# Patient Record
Sex: Female | Born: 1955 | Race: White | Hispanic: No | Marital: Married | Smoking: Never smoker
Health system: Southern US, Community
[De-identification: ages and names within clinical notes are randomized; demographics above are authoritative.]

## PROBLEM LIST (undated history)

## (undated) DIAGNOSIS — R05 Cough: Secondary | ICD-10-CM

## (undated) DIAGNOSIS — M1990 Unspecified osteoarthritis, unspecified site: Secondary | ICD-10-CM

## (undated) DIAGNOSIS — Z8711 Personal history of peptic ulcer disease: Secondary | ICD-10-CM

## (undated) DIAGNOSIS — K219 Gastro-esophageal reflux disease without esophagitis: Secondary | ICD-10-CM

## (undated) DIAGNOSIS — Z8719 Personal history of other diseases of the digestive system: Secondary | ICD-10-CM

## (undated) DIAGNOSIS — Z923 Personal history of irradiation: Secondary | ICD-10-CM

## (undated) DIAGNOSIS — N6489 Other specified disorders of breast: Secondary | ICD-10-CM

## (undated) DIAGNOSIS — Z8679 Personal history of other diseases of the circulatory system: Secondary | ICD-10-CM

## (undated) DIAGNOSIS — Z853 Personal history of malignant neoplasm of breast: Secondary | ICD-10-CM

## (undated) DIAGNOSIS — J019 Acute sinusitis, unspecified: Secondary | ICD-10-CM

## (undated) DIAGNOSIS — R112 Nausea with vomiting, unspecified: Secondary | ICD-10-CM

## (undated) DIAGNOSIS — C50919 Malignant neoplasm of unspecified site of unspecified female breast: Secondary | ICD-10-CM

## (undated) DIAGNOSIS — Z98811 Dental restoration status: Secondary | ICD-10-CM

## (undated) DIAGNOSIS — Z9889 Other specified postprocedural states: Secondary | ICD-10-CM

## (undated) HISTORY — DX: Gastro-esophageal reflux disease without esophagitis: K21.9

## (undated) HISTORY — PX: PLANTAR FASCIA SURGERY: SHX746

## (undated) HISTORY — PX: BREAST LUMPECTOMY: SHX2

## (undated) HISTORY — DX: Malignant neoplasm of unspecified site of unspecified female breast: C50.919

## (undated) HISTORY — PX: TONSILLECTOMY: SUR1361

## (undated) HISTORY — PX: REDUCTION MAMMAPLASTY: SUR839

## (undated) HISTORY — PX: HEMORRHOID SURGERY: SHX153

## (undated) HISTORY — DX: Unspecified osteoarthritis, unspecified site: M19.90

## (undated) HISTORY — PX: BREAST BIOPSY: SHX20

---

## 1997-05-22 ENCOUNTER — Other Ambulatory Visit: Admission: RE | Admit: 1997-05-22 | Discharge: 1997-05-22 | Payer: Self-pay | Admitting: Gynecology

## 1998-12-17 ENCOUNTER — Other Ambulatory Visit: Admission: RE | Admit: 1998-12-17 | Discharge: 1998-12-17 | Payer: Self-pay | Admitting: Internal Medicine

## 1998-12-17 ENCOUNTER — Encounter (INDEPENDENT_AMBULATORY_CARE_PROVIDER_SITE_OTHER): Payer: Self-pay

## 1999-09-12 ENCOUNTER — Encounter: Payer: Self-pay | Admitting: Gynecology

## 1999-09-12 ENCOUNTER — Encounter: Admission: RE | Admit: 1999-09-12 | Discharge: 1999-09-12 | Payer: Self-pay | Admitting: Gynecology

## 2000-11-08 ENCOUNTER — Encounter: Payer: Self-pay | Admitting: Gynecology

## 2000-11-08 ENCOUNTER — Encounter: Admission: RE | Admit: 2000-11-08 | Discharge: 2000-11-08 | Payer: Self-pay | Admitting: Gynecology

## 2000-11-16 ENCOUNTER — Other Ambulatory Visit: Admission: RE | Admit: 2000-11-16 | Discharge: 2000-11-16 | Payer: Self-pay | Admitting: Gynecology

## 2001-11-24 ENCOUNTER — Other Ambulatory Visit: Admission: RE | Admit: 2001-11-24 | Discharge: 2001-11-24 | Payer: Self-pay | Admitting: Gynecology

## 2001-12-13 ENCOUNTER — Encounter: Payer: Self-pay | Admitting: Gynecology

## 2001-12-13 ENCOUNTER — Encounter: Admission: RE | Admit: 2001-12-13 | Discharge: 2001-12-13 | Payer: Self-pay | Admitting: Gynecology

## 2003-03-01 ENCOUNTER — Encounter: Admission: RE | Admit: 2003-03-01 | Discharge: 2003-03-01 | Payer: Self-pay | Admitting: Gynecology

## 2003-03-01 ENCOUNTER — Other Ambulatory Visit: Admission: RE | Admit: 2003-03-01 | Discharge: 2003-03-01 | Payer: Self-pay | Admitting: Gynecology

## 2003-03-08 ENCOUNTER — Encounter: Admission: RE | Admit: 2003-03-08 | Discharge: 2003-03-08 | Payer: Self-pay | Admitting: Gynecology

## 2003-09-05 ENCOUNTER — Encounter (INDEPENDENT_AMBULATORY_CARE_PROVIDER_SITE_OTHER): Payer: Self-pay | Admitting: Specialist

## 2003-09-05 ENCOUNTER — Inpatient Hospital Stay (HOSPITAL_COMMUNITY): Admission: RE | Admit: 2003-09-05 | Discharge: 2003-09-07 | Payer: Self-pay | Admitting: Gynecology

## 2003-09-05 HISTORY — PX: TOTAL ABDOMINAL HYSTERECTOMY W/ BILATERAL SALPINGOOPHORECTOMY: SHX83

## 2004-04-01 ENCOUNTER — Other Ambulatory Visit: Admission: RE | Admit: 2004-04-01 | Discharge: 2004-04-01 | Payer: Self-pay | Admitting: Gynecology

## 2004-11-05 ENCOUNTER — Encounter: Admission: RE | Admit: 2004-11-05 | Discharge: 2004-11-05 | Payer: Self-pay | Admitting: Gynecology

## 2004-12-05 ENCOUNTER — Ambulatory Visit: Payer: Self-pay | Admitting: Internal Medicine

## 2004-12-18 ENCOUNTER — Ambulatory Visit: Payer: Self-pay | Admitting: Internal Medicine

## 2005-04-20 ENCOUNTER — Other Ambulatory Visit: Admission: RE | Admit: 2005-04-20 | Discharge: 2005-04-20 | Payer: Self-pay | Admitting: Gynecology

## 2005-04-23 ENCOUNTER — Observation Stay (HOSPITAL_COMMUNITY): Admission: EM | Admit: 2005-04-23 | Discharge: 2005-04-23 | Payer: Self-pay | Admitting: Emergency Medicine

## 2005-04-23 ENCOUNTER — Ambulatory Visit: Payer: Self-pay | Admitting: Cardiology

## 2005-04-24 ENCOUNTER — Ambulatory Visit: Payer: Self-pay

## 2005-04-24 ENCOUNTER — Encounter: Payer: Self-pay | Admitting: Internal Medicine

## 2005-11-10 ENCOUNTER — Encounter: Admission: RE | Admit: 2005-11-10 | Discharge: 2005-11-10 | Payer: Self-pay | Admitting: Gynecology

## 2006-04-22 ENCOUNTER — Other Ambulatory Visit: Admission: RE | Admit: 2006-04-22 | Discharge: 2006-04-22 | Payer: Self-pay | Admitting: Gynecology

## 2006-11-12 ENCOUNTER — Encounter: Admission: RE | Admit: 2006-11-12 | Discharge: 2006-11-12 | Payer: Self-pay | Admitting: Gynecology

## 2006-11-22 ENCOUNTER — Ambulatory Visit: Payer: Self-pay | Admitting: Internal Medicine

## 2006-11-30 ENCOUNTER — Ambulatory Visit: Payer: Self-pay | Admitting: Internal Medicine

## 2007-11-14 ENCOUNTER — Encounter: Admission: RE | Admit: 2007-11-14 | Discharge: 2007-11-14 | Payer: Self-pay | Admitting: Gynecology

## 2007-11-21 ENCOUNTER — Ambulatory Visit: Payer: Self-pay | Admitting: Gynecology

## 2007-11-21 ENCOUNTER — Other Ambulatory Visit: Admission: RE | Admit: 2007-11-21 | Discharge: 2007-11-21 | Payer: Self-pay | Admitting: Gynecology

## 2007-11-21 ENCOUNTER — Encounter: Payer: Self-pay | Admitting: Gynecology

## 2008-10-25 ENCOUNTER — Ambulatory Visit: Payer: Self-pay | Admitting: Internal Medicine

## 2008-11-08 ENCOUNTER — Ambulatory Visit: Payer: Self-pay | Admitting: Internal Medicine

## 2008-11-14 ENCOUNTER — Encounter: Admission: RE | Admit: 2008-11-14 | Discharge: 2008-11-14 | Payer: Self-pay | Admitting: Gynecology

## 2008-11-28 ENCOUNTER — Ambulatory Visit: Payer: Self-pay | Admitting: Internal Medicine

## 2008-11-28 DIAGNOSIS — R030 Elevated blood-pressure reading, without diagnosis of hypertension: Secondary | ICD-10-CM

## 2009-11-21 ENCOUNTER — Encounter: Admission: RE | Admit: 2009-11-21 | Discharge: 2009-11-21 | Payer: Self-pay | Admitting: Gynecology

## 2009-11-22 ENCOUNTER — Other Ambulatory Visit: Admission: RE | Admit: 2009-11-22 | Discharge: 2009-11-22 | Payer: Self-pay | Admitting: Gynecology

## 2009-11-22 ENCOUNTER — Ambulatory Visit: Payer: Self-pay | Admitting: Gynecology

## 2009-11-25 ENCOUNTER — Ambulatory Visit: Payer: Self-pay | Admitting: Gynecology

## 2010-01-26 ENCOUNTER — Encounter: Payer: Self-pay | Admitting: Gynecology

## 2010-05-16 ENCOUNTER — Telehealth: Payer: Self-pay | Admitting: Internal Medicine

## 2010-05-16 ENCOUNTER — Other Ambulatory Visit (INDEPENDENT_AMBULATORY_CARE_PROVIDER_SITE_OTHER): Payer: 59

## 2010-05-16 ENCOUNTER — Encounter: Payer: Self-pay | Admitting: Nurse Practitioner

## 2010-05-16 ENCOUNTER — Ambulatory Visit (INDEPENDENT_AMBULATORY_CARE_PROVIDER_SITE_OTHER): Payer: 59 | Admitting: Nurse Practitioner

## 2010-05-16 VITALS — BP 134/80 | HR 72 | Ht 62.0 in | Wt 168.0 lb

## 2010-05-16 DIAGNOSIS — R1013 Epigastric pain: Secondary | ICD-10-CM

## 2010-05-16 LAB — COMPREHENSIVE METABOLIC PANEL
Alkaline Phosphatase: 76 U/L (ref 39–117)
BUN: 9 mg/dL (ref 6–23)
Chloride: 103 mEq/L (ref 96–112)
GFR: 79.32 mL/min (ref >60.00)
Glucose, Bld: 86 mg/dL (ref 70–99)
Potassium: 4.3 mEq/L (ref 3.5–5.1)
Sodium: 142 mEq/L (ref 135–145)
Total Bilirubin: 0.8 mg/dL (ref 0.3–1.2)

## 2010-05-16 LAB — CBC WITH DIFFERENTIAL/PLATELET
Basophils Relative: 0.3 % (ref 0.0–3.0)
Eosinophils Relative: 0.7 % (ref 0.0–5.0)
HCT: 44.7 % (ref 36.0–46.0)
Lymphs Abs: 1.5 10*3/uL (ref 0.7–4.0)
MCV: 96.9 fl (ref 78.0–100.0)
Monocytes Absolute: 0.4 10*3/uL (ref 0.1–1.0)
Monocytes Relative: 6.5 % (ref 3.0–12.0)
Neutrophils Relative %: 69.9 % (ref 43.0–77.0)
RBC: 4.61 Mil/uL (ref 3.87–5.11)
WBC: 6.6 10*3/uL (ref 4.5–10.5)

## 2010-05-16 MED ORDER — ESOMEPRAZOLE MAGNESIUM 40 MG PO CPDR: 40.0000 mg | DELAYED_RELEASE_CAPSULE | Freq: Every day | ORAL | Status: DC

## 2010-05-16 MED ORDER — TRAMADOL HCL 50 MG PO TABS: 50.0000 mg | ORAL_TABLET | Freq: Four times a day (QID) | ORAL | Status: DC | PRN

## 2010-05-16 NOTE — Patient Instructions (Addendum)
We have scheduled the Endoscopy for 05-22-2010 Directions and brochure provided. We scheduled the  Ultrasound on 5-21. Hold the aspirin and advil until after the procedure. We have given you Nexium samples. Take 1 30 min before breakfast  Ulcer Disease (Peptic Ulcer, Gastric Ulcer, Duodenal Ulcer) You have an ulcer. This may be in your stomach (gastric ulcer) or in the first part of your small bowel, the duodenum (duodenal ulcer). An ulcer is a break in the lining of the stomach or duodenum. The ulcer causes erosion into the deeper tissue. CAUSES The stomach has a lining to protect itself from the acid that digests food. The lining can be damaged in two main ways:  The Helico Pylori bacteria (H. Pyolori) can infect the lining of the stomach and cause ulcers.   Nonsteroidal, anti-inflammatory medications (NSAIDS) can cause gastric ulcerations.   Smoking tobacco can increase the acid in the stomach. This can lead to ulcers, and will impair healing of ulcers.  Other factors, such as alcohol use and stress may contribute to ulcer formation. Rarely, a tumor or cancer can cause an ulcer.  SYMPTOMS The problems (symptoms) of ulcer disease are usually a burning or gnawing of the mid-upper belly (abdomen). This is often worse on an empty stomach and may get better with food. This may be associated with feeling sick to your stomach (nausea), bloating, and vomiting. If the ulcer results in bleeding, it can cause:  Black, tarry stools.   Vomiting of bright red blood.   Vomiting coffee-ground-looking materials.  With severe bleeding, there may be loss of consciousness and shock.  DIAGNOSIS Learning what is wrong (diagnosis) is usually made based upon your history and an exam. Medications are so effective that further tests may not be necessary. If needed, other tests may include:  Blood tests, x-rays, or heart tests. These are done to be sure that no other conditions are causing your symptoms.    X-rays (Barium studies) such as an upper GI series.   Most commonly, an upper GI endoscopy can confirm your diagnosis. This is when a flexible tube is passed through the mouth (using sedative medication). The tube is used to look at the inside of esophagus, stomach, and small bowel. Abnormal pieces of tissue may be removed to examine under the microscope (biopsy).  TREATMENT Bleeding from ulcers can usually be treated via endoscopy. Rarely, surgery is needed for ulcers when bleeding cannot be stopped. Surgery is needed if the ulcer goes through the wall of the stomach or duodenum.  After any bleeding is stopped, medications are the main treatment:  If your ulcer was caused by the bacteria H. Pylori, then you will need antibiotics to kill the infection. Usually, a program involving more than one antibiotic, and a medicine for stomach acid, is prescribed.   Medicine to decrease acid production is used for almost all ulcers. Your caregiver will make a recommendation. They will also tell you how long you must use the medication.   Stop using any medications or substances that may have contributed to your ulcer (alcohol, tobacco, caffeine, for example).   Medications are available that protect the lining of the bowel.  HOME CARE INSTRUCTIONS Continue regular work and usual activities unless advised otherwise by your caregiver.  Avoid tobacco, alcohol, and caffeine. Tobacco use will decrease and slow the rates of healing.   Avoid foods that seem to aggravate or cause discomfort.   There are many over-the-counter products available to control stomach acid and other symptoms.  Discuss these with your caregiver before using them. DO NOT substitute over-the-counter medications for prescription medications without discussing with your caregiver.   Special diets are not usually needed.   Keep any follow-up appointments and blood tests, as directed.  SEEK MEDICAL CARE IF:  Your pain or other ulcer  symptoms do not improve within a few days of starting treatment.   You develop diarrhea. This can be a complication of certain treatments.   You have ongoing indigestion or heartburn, even if your main ulcer symptoms are improved.  SEEK IMMEDIATE MEDICAL CARE IF:  You develop bright red, rectal bleeding; dark black, tarry stools; or vomit blood.   You become light-headed, weak, have fainting episodes, or become sweaty, cold and clammy.   You experience severe abdominal pain not controlled by medications. DO NOT take pain medications unless ordered by your caregiver.  Document Released: 10/01/2004 Document Re-Released: 03/20/2008 [facility] Patient Information 2011 [place], [place].

## 2010-05-16 NOTE — Telephone Encounter (Signed)
Pt complains of epigastric pain and burning. States it woke her up at 1am and she also had pain between her shoulder blades and some nausea. Pt never threw up but was nauseated. Requests to be seen today. Appt made for pt to see Willette Cluster NP.

## 2010-05-16 NOTE — Progress Notes (Signed)
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 Patient was last seen at time of her colonoscopy in November 2010.  Patient presents for evaluation of epigastric pain. At 1:00am this morning patient woke up with epigastric pain radiating through to the middle of her shoulder blades. She had associated nausea but no vomiting. After walking around and belching pain was relieved but recurred within a couple of hours. She still having waves of pain at present. Patient isn't quite sure she's had this pain before, certain not this intense anyway. No fever or chills. No black stools. Patient has been taking Advil twice daily for plantar fasciitis. She is unable to stop the NSAID because of recurrent pain in her feet. She also takes a daily baby aspirin. No heartburn , no change in bowels.  Past Medical History  Diagnosis Date  . Elevated blood pressure reading without diagnosis of hypertension    Past Surgical History  Procedure Date  . Abdominal hysterectomy   . Cesarean section   . Hemorrhoid surgery   . Foot surgery     Left  . Tonsillectomy     reports that she has never smoked. She has never used smokeless tobacco. She reports that she drinks alcohol. She reports that she does not use illicit drugs. family history includes Colon cancer in her father. Allergies    Allergen Reactions  . Penicillins   . Sulfonamide Derivatives     REACTION: hives    Outpatient Encounter Prescriptions as of 05/16/2010  Medication Sig Dispense Refill  . aspirin 81 MG tablet Take 81 mg by mouth daily.        [name] ibuprofen (ADVIL) 200 MG tablet Take 200 mg by mouth. Two twice daily          ROS: All other systems reviewed and negative except where noted in the History of Present Illness.   Physical Exam: General: Well developed white female in no acute distress Head: Normocephalic and atraumatic Eyes:  sclerae anicteric,conjunctive pink. Ears: Normal auditory acuity Mouth: No deformity or lesions Neck: Supple, no masses.  Lungs: Clear throughout to auscultation Heart: Regular rate and rhythm; no murmurs heard Abdomen: Soft, non distended. Mild left upper quadrant tenderness .No masses or hepatomegaly noted. Normal Bowel sounds Rectal: Scant amount of light brown, heme-negative stool in the vault Musculoskeletal: Symmetrical with no gross deformities  Skin: No lesions on visible extremities Extremities: No edema or deformities noted Neurological: Alert oriented x 4, grossly nonfocal Cervical Nodes:  No significant cervical adenopathy Psychological:  Alert and cooperative. Normal mood and affect  Assessment and Plan:

## 2010-05-16 NOTE — Telephone Encounter (Signed)
Left message for pt to call back  °

## 2010-05-18 DIAGNOSIS — R1013 Epigastric pain: Secondary | ICD-10-CM | POA: Insufficient documentation

## 2010-05-18 NOTE — Assessment & Plan Note (Signed)
Acute epigastric pain radiating through to back with associated nausea. Rule out gallbladder disease. Rule out PUD in setting of daily Advil and daily baby ASA. Will obtain some basic labs and patient will be scheduled for EGD. The benefits, risks, and potential complications of EGD with possible biopsies were discussed with the patient and she agrees to proceed. If EGD is unrevealing, will obtain an ultrasound of the abdomen. Hold NSAIDs until after EGD. Will try her on Ultram for foot pain. Begin daily PPI.

## 2010-05-19 ENCOUNTER — Telehealth: Payer: Self-pay | Admitting: Internal Medicine

## 2010-05-19 ENCOUNTER — Encounter: Payer: Self-pay | Admitting: Internal Medicine

## 2010-05-19 NOTE — Telephone Encounter (Signed)
Pt states she saw Willette Cluster NP on Friday. She was having epigastric pain and right below her sternum was tender to touch. Pt is scheduled for an egd for Thursday. She states that over the weekend the pain moved down through her stomach and she had diarrhea for a day. She reports the epigastric pain is gone and she wonders if she needs to keep the appt for the EGD. Dr. Marina Goodell please advise.

## 2010-05-19 NOTE — Progress Notes (Signed)
i agree with the plan outlined in this note 

## 2010-05-19 NOTE — Telephone Encounter (Signed)
Probably best that she keeps the appointment for EGD given the recent problems with pain. Also, she has been on NSAIDs for plantar fasciitis. It would be helpful to rule out ulcer, or have a baseline exam in case the pain were to return. Depending upon the EGD findings, that would determine the need for further evaluation, such as gallbladder. I will discuss this with her when I see her at the time of her EGD. Thanks

## 2010-05-19 NOTE — Telephone Encounter (Signed)
Marie Pierce, see my endoscopy report after her exam is complete. you may need to reschedule the ultrasound for her

## 2010-05-19 NOTE — Telephone Encounter (Signed)
Error

## 2010-05-19 NOTE — Telephone Encounter (Signed)
Pt aware of Dr. Lamar Sprinkles recommendations and will keep the EGD appt. She also wanted to let Dr. Marina Goodell know that she is scheduled for an abdominal ultrasound and it is scheduled at [facility] for the 21st. If she needs to have this done after the EGD is done she will need for [place] to move it to [facility] for insurance coverage purposes. [facility] is out of network for her insurance. She just wanted to make sure we were aware.

## 2010-05-22 ENCOUNTER — Telehealth: Payer: Self-pay | Admitting: *Deleted

## 2010-05-22 ENCOUNTER — Ambulatory Visit (AMBULATORY_SURGERY_CENTER): Payer: 59 | Admitting: Internal Medicine

## 2010-05-22 ENCOUNTER — Encounter: Payer: Self-pay | Admitting: Internal Medicine

## 2010-05-22 VITALS — HR 85 | Temp 98.9°F | Ht 62.0 in | Wt 167.0 lb

## 2010-05-22 DIAGNOSIS — R1013 Epigastric pain: Secondary | ICD-10-CM

## 2010-05-22 DIAGNOSIS — K259 Gastric ulcer, unspecified as acute or chronic, without hemorrhage or perforation: Secondary | ICD-10-CM

## 2010-05-22 DIAGNOSIS — K2970 Gastritis, unspecified, without bleeding: Secondary | ICD-10-CM

## 2010-05-22 DIAGNOSIS — K253 Acute gastric ulcer without hemorrhage or perforation: Secondary | ICD-10-CM

## 2010-05-22 MED ORDER — SODIUM CHLORIDE 0.9 % IV SOLN: 500.0000 mL | INTRAVENOUS | Status: DC

## 2010-05-22 NOTE — Patient Instructions (Signed)
Dr. Marina Goodell saw esophagitis in the distal esophagus.  Multiple gastric ulcers. Clo test biopsy was taken.  We will advise you of the biopsy results.  Take Nexium 40 mh daily until next endoscopy.  Take Nexium in am 20 - 30 minutes before breakfast.  Avoid antiinflammotory medication (NSAIDS).  Resume all other meds other than NSAIDS today.  Previsit scheduled for Friday July 12, 2010 at 4:00 pm arrive by 3:45pm.  Bring medication list and insurance card.  Endoscopy scheduled for thur. July 17, 2010 9;00.  Please arrive at 8:00 am.  Call if any questions or concerns.

## 2010-05-22 NOTE — Telephone Encounter (Signed)
Called [facility] Attn:  Lynnea Ferrier and cxld Abdominal Ultrasound per Dr. Marina Goodell

## 2010-05-22 NOTE — Progress Notes (Signed)
Pt to the rest room before d/c.  Her daughter was at her side. MAW  No complaints on d/c.  Pt also asked if she was supposed to still have the scheduled ultra sound that [place] scheduled already.  Elizbeth Squires, RN will ask Dr. Marina Goodell.  Per Dr. Marina Goodell cancel the u/s.  Pt notified and Doristine Johns was called to cancel the u/s.maw

## 2010-05-23 ENCOUNTER — Telehealth: Payer: Self-pay

## 2010-05-23 NOTE — Consult Note (Signed)
NAMEAMILYAH, NACK               ACCOUNT NO.:  [id number]   MEDICAL RECORD NO.:  [id number]          PATIENT TYPE:  INP   LOCATION:  4705                         FACILITY:  [facility]   PHYSICIAN:  Duke Salvia, M.D.  DATE OF BIRTH:  06-16-1955   DATE OF CONSULTATION:  04/23/2005  DATE OF DISCHARGE:  04/23/2005                                   CONSULTATION   The patient was seen at the request of Dr. [place] Bing because of  palpitations.   Mrs. Talamante is a 55 year old woman with a past history of tachy  palpitations for the last 15 years.  They occurred the first time when she  was pregnant with her first child.  It lasted a couple of hours at that  time.  She subsequently has had these a couple of times a year and they  abort on their own after two or three minutes.  They are associated with  some degree of shortness of breath if they last long, some sense of  uneasiness.  They are _________ positive and diuretic positive.  Yesterday  she developed a spell while working at [employer].  It was typical of her other  spells.  Because it persisted, she went to Urgent Care where Valsalva was  associated with termination of the tachycardia.   She has no history of syncope or presyncope, no orthopnea, nocturnal dyspnea  or peripheral edema.   She does drink some caffeine.   According to her husband, she snores.  She also complains of fatigue.  She  attributes the latter to sleeping poorly because of her hot flashes.   After admission to the hospital, her troponins have bumped a little bit to  0.18.  Her cardiac risk factors are negative for hypertension, cigarettes,  diabetes, but she does have a positive family history.   MEDICATIONS:  Aspirin 81 and ibuprofen.   ALLERGIES:  SHE IS ALLERGIC TO ACETAMINOPHEN AND SULFA.   PAST SURGICAL HISTORY:  Notable for:  1.  TAH/BSO.  2.  C-section times two.   SOCIAL HISTORY:  She is married.  She is an Scientist, forensic.  She also works at her  church.  She has two children.  She does not use cigarettes, alcohol or  recreational drugs.   PHYSICAL EXAMINATION:  On examination, she is a middle-aged Caucasian female  appearing her stated age of 55.  Her blood pressure is 117/86.  Her pulse  was 86.  Her HEENT exam demonstrated no __________ xanthomata.  Neck veins  were flat.  Carotids were brisk and full bilaterally without bruits.  Back  was without kyphosis or scoliosis.  Lungs were clear.  Heart sounds were  regular without murmurs or gallops.  The abdomen was soft with active bowel  sounds, without midline pulsation or hepatomegaly.  Femoral pulses were 2+,  distal pulses were intact, and there is no clubbing, cyanosis or edema.  The  neurological exam was grossly normal.   Electrocardiogram of her tachycardia demonstrated a narrow QRS tachycardia  at a cycle length of 320 milliseconds.  There is clearly an inscribed  P wave  in the proximal portion of the ST segment in lead V1 consistent with an R  prime and a pseudo-Q wave notable in lead III.   Review of tracing labeled April 22, 2005 at 21:03:09 which had raised the  possibility of atrial flutter as well as the tracing from April 22, 2005 at  21:03:35 are in my opinion artifactual.  There is evidence from the post-  Valsalva strip of clear sinus rhythm.  The things that are labeled as #2  follow that and I suspect that these are artifactual and in fact represent  sinus rhythm.   IMPRESSION:  1.  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  2.  Positive troponin.  3.  Fatigue with symptoms consistent with obstructive sleep apnea.   I have suggested that she be considered for a sleep study.  We will plan to  undertake a stress Myoview in the office in the morning given her abnormal  troponins.  While she does not need an echo yet, if she were to have an  ablation we would want to get an echo, and so she would like to go ahead and  get that done at  the same time.   She will follow up with her primary physician regarding the possibility of a  sleep study.           ______________________________  Duke Salvia, M.D.     SCK/MEDQ  D:  04/23/2005  T:  04/24/2005  Job:  [id number]   cc:   Urgent Care

## 2010-05-23 NOTE — Discharge Summary (Signed)
Marie Pierce, Marie Pierce               ACCOUNT NO.:  [id number]   MEDICAL RECORD NO.:  [id number]          PATIENT TYPE:  INP   LOCATION:  4705                         FACILITY:  [facility]   PHYSICIAN:  Doylene Canning. Ladona Ridgel, MD    DATE OF BIRTH:  16-Oct-1955   DATE OF ADMISSION:  04/22/2005  DATE OF DISCHARGE:  04/23/2005                                 DISCHARGE SUMMARY   He had   ALLERGIES:  1. ACETAMINOPHEN.  2. SULFA.   PRIMARY DIAGNOSES:  1. Admitted with tachy-palpitations probably and arteriovenous nodal      reentry tachycardia (spontaneous conversion in the emergency room).  2. The patient choosing beta blocker therapy over ablation for      suppression:  Inderal 10 mg as needed.   SECONDARY DIAGNOSES:  1. Fatigue with symptoms consistent with obstructive sleep apnea.  2. Status post total abdominal hysterectomy/bilateral salpingo-      oophorectomy menorrhagia/  3. Cesarean section x2.   PROCEDURES:  None this admission.   PLAN:  1. Outpatient Myoview, April 24, 2005 at 9:45.  2. Echocardiogram, April 24, 2005 at 1 o'clock in the afternoon.  3. Recommend sleep study with primary care physician to set this up.   BRIEF HISTORY:  Marie Pierce is a 55 year old female.  She has a history of  palpitation which dates back to 15 years ago.  These were first noted after  the patient gave birth to her first child.  Episodes prior to this admission  were infrequent about two to three a year, lasting only two to three minutes  and they all resolved spontaneously.   Yesterday, she noted onset of tachy arrhythmias.  She felt short of breath.  She was slightly anxious.  There was no chest pain, no dizziness, no  syncope.  The patient has one to two cups of coffee in the morning and takes  antihistamines at night.  She presented to Urgent Care because of the  symptoms and heart racing did not subside over a two to three-hour period.  Urgent Care interventions reduced the rate through  Valsalva maneuver and  then they sent the patient to the emergency room at [facility].  While in the emergency room, she had spontaneous conversion to sinus rhythm.   HOSPITAL COURSE:  The patient presented by way of Urgent Care to [facility]  Emergency Room with tachypalpitations.  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  Her  troponin I studies were measured. They were less than 0.05, less than 0.05  and 0.18.  D-dimer was 0.45.  The patient was not complaining of chest pain,  only dyspnea and palpitations.  She was seen in consultation by Dr. Graciela Husbands.  The following options were discussed with the patient:  1)  The patient  could do nothing, but use maneuvers when having tachypalpitations; 2)  Drug  therapy as needed for example Inderal 10 mg daily; 3)  Electrophysiology  study with radiofrequency catheter ablation.  The patient would like to  pursue the beta blocker therapy and so she was discharged  the same day April 23, 2005 with Inderal 10 mg as needed.   She was set up for a stress Myoview study April 24, 2005 and an  echocardiogram April 24, 2005.  She has follow-up with Dr. Graciela Husbands after these  procedures are finished.  She is asked not to take her beta blocker until  after her stress study.  The patient was given a prescription for Inderal 10  mg tablets.  Other pertinent labs this admission:  Complete blood count  white cells 6.6, hemoglobin 14.8, hematocrit 42.8, platelets 199.  Serum  electrolytes sodium 143, potassium 4.2, chloride one 111, carbonate 27,  glucose is 122, BUN is 14, creatinine 0.8, SGOT 26, SGPT 26, alkaline  phosphatase 103.  Lipid profile:  Total cholesterol 202, triglycerides 164,  HDL cholesterol was 41.  The LDL cholesterol was high at 128.  TSH this  admission 1.576.     ______________________________  Maple Mirza, PA    ______________________________  Doylene Canning. Ladona Ridgel, MD     GM/MEDQ  D:  08/31/2005  T:  09/01/2005  Job:  [id number]   cc:   Duke Salvia, MD,FACC

## 2010-05-23 NOTE — Telephone Encounter (Signed)
No answer

## 2010-05-23 NOTE — Discharge Summary (Signed)
NAME:  Marie Pierce, Marie Pierce                         ACCOUNT NO.:  [id number]   MEDICAL RECORD NO.:  [id number]                   PATIENT TYPE:  INP   LOCATION:  9320                                 FACILITY:  [facility]   PHYSICIAN:  Timothy P. Fontaine, M.D.           DATE OF BIRTH:  1955/08/22   DATE OF ADMISSION:  09/05/2003  DATE OF DISCHARGE:  09/07/2003                                 DISCHARGE SUMMARY   DISCHARGE DIAGNOSES:  1.  Leiomyomata.  2.  Menorrhagia.  3.  Pathology pending.   PROCEDURE:  Total abdominal hysterectomy and bilateral salpingo-  oophorectomy, September 05, 2003.   HOSPITAL COURSE:  A 55 year old female, underwent uncomplicated TAH/BSO,  September 05, 2003, with findings of multiple leiomyomata.  The patient's  postoperative course was uncomplicated, and she was discharged on  postoperative day #2, ambulating well, tolerating a regular diet, with a  postoperative hemoglobin of 10.3.  The patient received precautions,  instructions, and follow up.  Will be seen in the office in 2 weeks and  received a prescription for oral Demerol 50 mg 1 p.o. q.4-6h. p.r.n. pain.  The patient is allergic to ACETAMINOPHEN.                                               Timothy P. Audie Box, M.D.    TPF/MEDQ  D:  09/07/2003  T:  09/10/2003  Job:  [id number]

## 2010-05-23 NOTE — Op Note (Signed)
NAME:  Marie Pierce, Marie Pierce                         ACCOUNT NO.:  [id number]   MEDICAL RECORD NO.:  [id number]                   PATIENT TYPE:  INP   LOCATION:  9399                                 FACILITY:  [facility]   PHYSICIAN:  Timothy P. Fontaine, M.D.           DATE OF BIRTH:  1955/07/07   DATE OF PROCEDURE:  09/05/2003  DATE OF DISCHARGE:                                 OPERATIVE REPORT   POSTOPERATIVE DIAGNOSIS:  Leiomyomata.   POSTOPERATIVE DIAGNOSIS:  Leiomyomata.   PROCEDURE:  Total abdominal hysterectomy, bilateral salpingo-oophorectomy.   SURGEON:  Timothy P. Fontaine, M.D.   ASSISTANTGaetano Hawthorne. Lily Peer, M.D.   ANESTHESIA:  General endotracheal.   COMPLICATIONS:  None.   ESTIMATED BLOOD LOSS:  300 mL.   SPECIMENS:  Uterus, fresh weight 350 g; bilateral tubes; bilateral ovaries.   FINDINGS:  Multiple myomas.  Ovaries and fallopian tube segments grossly  normal bilaterally.   PROCEDURE:  The patient was taken to the operating room, underwent general  endotracheal anesthesia, received an abdominal, perineal, vaginal  preparation with Betadine solution.  A Foley catheter was placed in sterile  technique by the nursing personnel.  The patient draped in the usual  fashion.  The abdomen was sharply entered through a repeat Pfannenstiel  incision, achieving adequate hemostasis at all levels.  The Balfour  retractor was placed within the incision and the intestines were packed from  the operative field.  There were significant vesicouterine fold adhesions,  which initially had to be dissected free, and subsequently the bladder blade  was placed.  The uterus was elevated from the pelvis, the left found  ligament identified, transected with electrocautery, the infundibulopelvic  ligament and vessels identified, doubly clamped, cut, and doubly ligated  using 0 Vicryl suture.  A similar procedure was carried out on the other  side.  The vesicouterine plane was then sharply  developed, the right uterine  vessels skeletonized, clamped, cut, and doubly ligated using 0 Vicryl  suture.  A similar procedure was carried out on the other side.  The uterus  was then initially freed from its attachments through clamping, cutting, and  ligating of the parametrial tissues, at which point, due to the bulk of the  uterus, a supracervical hysterectomy was performed to allow better  visualization of the posterior cul-de-sac.  There was sigmoid and adhesions  noted to the posterior cul-de-sac, and these were sharply lysed and freed to  allow removal of the cervix.  Subsequently the cervical stump was elevated  and progressively freed from its attachments through clamping, cutting, and  ligating of the paracervical and cardinal ligaments using 0 Vicryl suture.  Subsequently the vagina was sharply entered.  The cervix was excised, right  and left vaginal angle sutures were placed using 0 Vicryl suture and tagged  for future reference.  The vaginal cuff was run using 0 Vicryl suture in a  running interlocking stitch and subsequently  was reapproximated anterior to  posterior using 0 Vicryl stitch in a simple stitch.  The pelvis was  copiously irrigated, adequate hemostasis visualized, the bowel packing  removed, the Balfour retractor and bladder blade removed, and the anterior  fascia was reapproximated using 0 Vicryl suture in a running stitch starting  at the angle and meeting in the middle.  The subcutaneous tissues were  irrigated, hemostasis was achieved with electrocautery.  The skin was  reapproximated using 2-0 Vicryl in a running subcuticular stitch.  Steri-  Strips and Benzoin applied, sterile dressing applied.  The patient awakened  without difficulty and taken to the recovery room in good condition, having  tolerated the procedure well.                                               Timothy P. Audie Box, M.D.    TPF/MEDQ  D:  09/05/2003  T:  09/05/2003  Job:   [id number]

## 2010-05-23 NOTE — H&P (Signed)
NAME:  Marie Pierce, DEMONTE                         ACCOUNT NO.:  [id number]   MEDICAL RECORD NO.:  [id number]                   PATIENT TYPE:  INP   LOCATION:  NA                                   [facility]   PHYSICIAN:  Timothy P. Fontaine, M.D.           DATE OF BIRTH:  [birth year]   DATE OF ADMISSION:  09/05/2003  DATE OF DISCHARGE:                                HISTORY & PHYSICAL   CHIEF COMPLAINT:  Increasing menorrhagia, dysmenorrhea.   HISTORY OF PRESENT ILLNESS:  [age]-year-old G2, P2 female, status post  tubal sterilization, increasing menorrhagia/dysmenorrhea,  outpatient  evaluation reveals enlarged uterus with multiple myomas, for total abdominal  hysterectomy, bilateral salpingo-oophorectomy.  The risks, benefits,  indications and alternatives of the procedure were reviewed with her and she  wants to proceed with surgery.   PAST MEDICAL HISTORY:  Past medical history uncomplicated.   PAST SURGICAL HISTORY:  Past surgical history includes:  1. Cesarean section x2 with tubal sterilization.  2. Breast biopsy x2.  3. D&C.  4. Hysteroscopy.  5. Laparoscopy.   ALLERGIES:  ACETAMINOPHEN and SULFA.   REVIEW OF SYSTEMS:  Review of systems noncontributory.   SOCIAL HISTORY:  Social history noncontributory.   FAMILY HISTORY:  Family history noncontributory.   ADMISSION PHYSICAL EXAM:  HEENT:  Normal.  LUNGS:  Lungs clear.  CARDIAC:  Regular rate without murmurs, rubs, or gallops.  ABDOMEN:  Abdominal exam is benign.  PELVIC:  External, BUS, vagina normal.  Cervix normal.  Uterus bulky, mildly  enlarged, irregular, consistent with leiomyomata.  Adnexa without masses or  tenderness.   ASSESSMENT:  [age]-year-old gravida 2, para 2 female, status post  sterilization, increasing menorrhagia/dysmenorrhea/multiple myomas, for  total abdominal hysterectomy/bilateral salpingo-oophorectomy.  The risks,  benefits, indications and alternatives for the procedure were  reviewed with  the patient as well as the expected intraoperative/postoperative course.  Ovarian conservation issue was discussed with the patient.  The options of  keeping both ovaries versus removing them were reviewed, the risks of  keeping her ovaries to include ovarian problems such as pain or reoperation  for cysts, as well as ovarian cancer were discussed.  The benefits of  continued hormone production were reviewed and the risks of removal leading  to hypoestrogenism and symptoms requiring estrogen replacement therapy were  all discussed with her.  The risks of estrogen replacement therapy were  discussed to include the risks of stroke, heart attack, deep venous  thrombosis as well as breast cancer issues all reviewed with her and she  wants both ovaries removed.  Absolute irreversible sterility associated with  hysterectomy was also discussed with her.  Sexuality following hysterectomy  was discussed and the potential for orgasmic dysfunction as well as  persistent dyspareunia were discussed, understood and accepted.  The acute  surgical risks were reviewed with her to include infection, both internal  with abscess formation requiring opening and draining, reoperation of  abscesses, incisional complications such as incisional infections, seromas  or hematomas requiring opening and draining of incisions, closure by  secondary intention, as well as generalized infections requiring prolonged  antibiotics were all discussed, understood and accepted.  The risk of  hemorrhage necessitating transfusion and the risks of transfusion were  reviewed to include transfusion reaction, hepatitis, human immunodeficiency  virus, mad cow disease and other unknown entities.  The risks of inadvertant  injury to internal organs including bowel, bladder, ureters, vessels and  nerves necessitating major exploratory or reparative surgeries and future  reparative surgeries including ostomy formation were  all discussed,  understood and accepted.  The patient's questions were answered to her  satisfaction and she is ready to proceed with surgery.                                               Timothy P. Audie Box, M.D.    TPF/MEDQ  D:  09/03/2003  T:  09/03/2003  Job:  [id number]

## 2010-05-23 NOTE — H&P (Signed)
Marie Pierce, Marie Pierce               ACCOUNT NO.:  [id number]   MEDICAL RECORD NO.:  [id number]          PATIENT TYPE:  INP   LOCATION:  1831                         FACILITY:  [facility]   PHYSICIAN:  Greggory Stallion L. Pernell Dupre, M.D.  DATE OF BIRTH:  03/17/1955   DATE OF ADMISSION:  04/22/2005  DATE OF DISCHARGE:                                HISTORY & PHYSICAL   PRIMARY CARE PHYSICIAN:  Her gynecologist.   CHIEF COMPLAINT:  Palpitations, shortness of breath and chest pressure.   HISTORY OF PRESENT ILLNESS:  This is a 55 year old Caucasian female with no  significant past medical history who presents with palpitation, shortness of  breath, chest heaviness.  The patient states the symptoms have occurred over  the past 15 years.  They began when she had a baby with associated  palpitations attributed to caffeine intake.  Since that time, she gets  palpitations approximately two to three times per year and never diagnosed  with atrial fibrillation or flutter.  Today, at church, while icing a cake,  palpitations started, lasted approximately 45 minutes.  [facility] and started developing some heaviness of her chest.  EKG done found to  have atrial flutter 2:1.  They referred her to [facility].  No  syncope, presyncope, no orthopnea, PND, or lower extremity edema.  The  patient states that she drinks may be one to two cups of coffee in the a.m.  She takes an antihistamine every night.  She also drinks green tea today.  The patient spontaneously converted back to normal sinus rhythm in the  emergency room.   ALLERGIES:  ACETAMINOPHEN and SULFA.   MEDICATIONS:  1.  Aspirin 81 mg daily.  2.  Ibuprofen.  3.  No recent sinus medicines.  4.  She takes an antihistamine every night.   PAST MEDICAL HISTORY:  1.  Total abdominal hysterectomy.  2.  Bilateral salpingo-oophorectomy.  3.  Early myomata and menorrhagia.  4.  C-sections x2.   SOCIAL HISTORY:  Lives in [place] with  her husband.  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  Has two children.  No tobacco.  Occasional  alcohol.  No drugs.  She is working on her shots and diet.  Herbal use,  green tea today.   FAMILY HISTORY:  Mother is alive at 6, has a history of diabetes and  cardiomyopathy.  Father is alive at 25, has a history of MI and  hypertension.  Siblings-two brothers who are healthy.  Two children who are  healthy.   REVIEW OF SYSTEMS:  Positive for chest pain, shortness of breath,  palpitations.  All other systems are negative.   PHYSICAL EXAMINATION:  VITAL SIGNS:  Afebrile 98.1, pulse 102, respiratory  rate 16, blood pressure 140/83, saturating 98% on room air.  GENERAL:  Alert and oriented x3 in no apparent distress.  HEENT:  No increase JVP.  No thyromegaly and no carotid bruits.  CARDIOVASCULAR:  Normal S1 and S2.  No murmurs, gallops, or rubs.  LUNGS:  Clear to auscultation bilaterally.  ABDOMEN:  Soft, nontender, and nondistended.  Overweight.  Positive bowel  sounds.  EXTREMITIES:  No clubbing, cyanosis, or edema.  Normal radial, femoral and  pedal pulses.  NEUROLOGICAL:  Alert and oriented x3.  Cranial nerves II through XII are  intact.   Chest x-ray is pending.  EKG on April 22, 2005, 2050 the first EKG showed a  rate of 176, rhythm atrial flutter, 2:1 axis, 87.  QRS or QTC at 445.  Flat,  inverted T-waves in III and aVF. Labs showed a white count of 6.6, H&H of  14.8 and 43.0, platelets 199,000.  Normal differential.  MB of 1.3, troponin  of 7.05.  This was done x2.   ASSESSMENT:  Paroxysm atrial flutter.  She has a chance score of 1/5, low  risk of bleed, low risk of cerebrovascular accident.   PLAN:  1.  First issue, place on a beta blocker control the rate.  Metoprolol 12.5      mg twice a day.  Titrate to effect.  2.  Place on aspirin 325 a day.  3.  Check TSH, cardiac enzymes, chest x-ray, urine tox, chemistry panel,      monitor on telemetry.  EP evaluation.   Consider ablation if persists      versus long-term anticoagulation.  The patient is full code.           ______________________________  Leonard Downing. Pernell Dupre, M.D.     Loralie Champagne  D:  04/23/2005  T:  04/23/2005  Job:  [id number]

## 2010-05-26 ENCOUNTER — Other Ambulatory Visit (HOSPITAL_COMMUNITY): Payer: 59

## 2010-05-26 DIAGNOSIS — K2970 Gastritis, unspecified, without bleeding: Secondary | ICD-10-CM

## 2010-05-26 DIAGNOSIS — K2990 Gastroduodenitis, unspecified, without bleeding: Secondary | ICD-10-CM

## 2010-05-26 LAB — HELICOBACTER PYLORI SCREEN-BIOPSY: UREASE: NEGATIVE

## 2010-05-29 ENCOUNTER — Telehealth: Payer: Self-pay | Admitting: Internal Medicine

## 2010-05-29 NOTE — Telephone Encounter (Signed)
Left message for pt to call back  °

## 2010-05-30 NOTE — Telephone Encounter (Signed)
Pt states that she was taken off of all anti-inflammatory meds due to ulcers that were found on recent egd. Pt has plantar fascitis and wants to know if perhaps Voltaren gel may be an option. The Ultram that we gave her made her feel dizzy. Per Mike Gip PA pt should not use this due to ulcers. Pt may take Tylenol or ES Tylenol. Pt aware of above instructions.

## 2010-05-30 NOTE — Telephone Encounter (Signed)
Agree that she needs to avoid all NSAIDS. She should talk to the physician treating her foot problem about other treatment options

## 2010-07-15 ENCOUNTER — Encounter: Payer: Self-pay | Admitting: Internal Medicine

## 2010-07-17 ENCOUNTER — Other Ambulatory Visit: Payer: 59 | Admitting: Internal Medicine

## 2010-08-01 ENCOUNTER — Other Ambulatory Visit: Payer: 59 | Admitting: Internal Medicine

## 2010-08-07 ENCOUNTER — Other Ambulatory Visit: Payer: Self-pay | Admitting: Nurse Practitioner

## 2010-10-13 ENCOUNTER — Other Ambulatory Visit: Payer: Self-pay | Admitting: Nurse Practitioner

## 2010-11-07 ENCOUNTER — Other Ambulatory Visit: Payer: Self-pay | Admitting: Gynecology

## 2010-11-07 DIAGNOSIS — Z1231 Encounter for screening mammogram for malignant neoplasm of breast: Secondary | ICD-10-CM

## 2010-12-04 ENCOUNTER — Ambulatory Visit
Admission: RE | Admit: 2010-12-04 | Discharge: 2010-12-04 | Disposition: A | Discharge status: Home / Self Care | Payer: 59 | Source: Ambulatory Visit | Attending: Gynecology | Admitting: Gynecology

## 2010-12-04 DIAGNOSIS — Z1231 Encounter for screening mammogram for malignant neoplasm of breast: Secondary | ICD-10-CM

## 2011-02-09 ENCOUNTER — Other Ambulatory Visit: Payer: Self-pay | Admitting: Internal Medicine

## 2011-02-12 MED ORDER — ESOMEPRAZOLE MAGNESIUM 40 MG PO CPDR: 40.0000 mg | DELAYED_RELEASE_CAPSULE | Freq: Every day | ORAL | Status: DC

## 2011-02-12 NOTE — Telephone Encounter (Signed)
Okay to refill Nexium. I really recommend that she have the reload endoscopy to assure ulcer healing. Please let her know

## 2011-02-12 NOTE — Telephone Encounter (Signed)
Pt saw Willette Cluster NP 05/16/10. EGD done 05/22/10, pt was placed on Nexium and told to take the meds until relook egd done in 8 weeks. Pt had EGD scheduled for July 27th but she cancelled the appt. Pt is now calling requesting a refill on her Nexium. Wants to know if she can have a refill or if she has to be seen. Please advise.

## 2011-02-12 NOTE — Telephone Encounter (Signed)
Spoke with pt and she is aware, she will try to call back and schedule the EGD when she can get her work schedule. Rx sent to the pharmacy.

## 2011-04-13 ENCOUNTER — Telehealth: Payer: Self-pay | Admitting: Internal Medicine

## 2011-04-13 NOTE — Telephone Encounter (Signed)
Pt states that when she refilled her Nexium last she was told she needed to have an EGD. Pt states she is doing great and the Nexium is working. Pt wants to know if she needs to have an OV or can she be scheduled for a direct procedure. Dr. Marina Goodell please advise.

## 2011-04-13 NOTE — Telephone Encounter (Signed)
Yes, she is overdue for EGD to follow up gastric ulcer healing. Looks like she cancelled several times. She needs to stay on PPI, off NSAIDs, and keep her EGD / [facility] appointment if she agrees to schedule.

## 2011-04-14 NOTE — Telephone Encounter (Signed)
Left message for pt to call back  °

## 2011-04-15 ENCOUNTER — Ambulatory Visit (AMBULATORY_SURGERY_CENTER): Payer: 59 | Admitting: *Deleted

## 2011-04-15 DIAGNOSIS — K259 Gastric ulcer, unspecified as acute or chronic, without hemorrhage or perforation: Secondary | ICD-10-CM

## 2011-04-15 NOTE — Telephone Encounter (Signed)
Pt aware. Pt scheduled for previsit 05/18/11@4 :30pm. EGD scheduled with Dr. Marina Goodell in the [facility] 05/20/11@2 :30pm. Pt aware of appt dates and times.

## 2011-05-20 ENCOUNTER — Encounter: Payer: Self-pay | Admitting: Internal Medicine

## 2011-05-20 ENCOUNTER — Ambulatory Visit (AMBULATORY_SURGERY_CENTER): Payer: 59 | Admitting: Internal Medicine

## 2011-05-20 VITALS — BP 133/71 | HR 78 | Temp 98.6°F | Resp 18 | Ht 62.0 in | Wt 165.0 lb

## 2011-05-20 DIAGNOSIS — K219 Gastro-esophageal reflux disease without esophagitis: Secondary | ICD-10-CM

## 2011-05-20 DIAGNOSIS — K259 Gastric ulcer, unspecified as acute or chronic, without hemorrhage or perforation: Secondary | ICD-10-CM

## 2011-05-20 HISTORY — PX: ESOPHAGOGASTRODUODENOSCOPY (EGD) WITH PROPOFOL: SHX5813

## 2011-05-20 MED ORDER — SODIUM CHLORIDE 0.9 % IV SOLN: 500.0000 mL | INTRAVENOUS | Status: DC

## 2011-05-20 NOTE — Op Note (Signed)
[facility] [address] [place], [place]  [address]  ENDOSCOPY PROCEDURE REPORT  PATIENT:  Marie, Pierce  MR#:  [id number] BIRTHDATE:  09-09-1955, 55 yrs. old  GENDER:  female  ENDOSCOPIST:  Wilhemina Bonito. Eda Keys, MD Referred by:  .Direct Self,  PROCEDURE DATE:  05/20/2011 PROCEDURE:  EGD, diagnostic 43235 ASA CLASS:  Class II INDICATIONS:  follow-up of gastric ulcer, GERD ; last exam 05-2010 (overdue for f/u)  MEDICATIONS:   MAC sedation, administered by CRNA, propofol (Diprivan) 150 mg IV TOPICAL ANESTHETIC:  none  DESCRIPTION OF PROCEDURE:   After the risks benefits and alternatives of the procedure were thoroughly explained, informed consent was obtained.  The LB GIF-H180 [id number] endoscope was introduced through the mouth and advanced to the second portion of the duodenum, without limitations.  The instrument was slowly withdrawn as the mucosa was fully examined. <<PROCEDUREIMAGES>>  The upper, middle, and distal third of the esophagus were carefully inspected and no abnormalities were noted. The z-line was well seen at the GEJ. The endoscope was pushed into the fundus which was normal including a retroflexed view. The antrum,gastric body, first and second part of the duodenum were unremarkable. Retroflexed views revealed a hiatal hernia.    The scope was then withdrawn from the patient and the procedure completed.  COMPLICATIONS:  None  ENDOSCOPIC IMPRESSION: 1) Normal EGD 2) Healed gastric ulcers 2) GERD  RECOMMENDATIONS: 1) Anti-reflux regimen to be followed 2) Continue Nexium for GERD  ______________________________ Wilhemina Bonito. Eda Keys, MD  CC:  The Patient  n. eSIGNED:   Wilhemina Bonito. Eda Keys at 05/20/2011 03:09 PM  Zadie Rhine, [phone]

## 2011-05-20 NOTE — Progress Notes (Signed)
Patient did not experience any of the following events: a burn prior to discharge; a fall within the facility; wrong site/side/patient/procedure/implant event; or a hospital transfer or hospital admission upon discharge from the facility. (G8907) Patient did not have preoperative order for IV antibiotic SSI prophylaxis. (G8918)  

## 2011-05-20 NOTE — Patient Instructions (Signed)

## 2011-05-21 ENCOUNTER — Telehealth: Payer: Self-pay | Admitting: *Deleted

## 2011-05-21 NOTE — Telephone Encounter (Signed)
Left message to call office if questions or concerns. 

## 2011-05-26 ENCOUNTER — Other Ambulatory Visit: Payer: Self-pay | Admitting: Internal Medicine

## 2011-08-04 ENCOUNTER — Other Ambulatory Visit: Payer: Self-pay | Admitting: Internal Medicine

## 2011-08-05 ENCOUNTER — Other Ambulatory Visit: Payer: Self-pay

## 2011-08-05 MED ORDER — ESOMEPRAZOLE MAGNESIUM 40 MG PO CPDR: 40.0000 mg | DELAYED_RELEASE_CAPSULE | Freq: Every day | ORAL | Status: DC

## 2011-10-04 ENCOUNTER — Other Ambulatory Visit: Payer: Self-pay | Admitting: Internal Medicine

## 2012-01-15 ENCOUNTER — Other Ambulatory Visit: Payer: Self-pay | Admitting: Gynecology

## 2012-01-15 DIAGNOSIS — Z1231 Encounter for screening mammogram for malignant neoplasm of breast: Secondary | ICD-10-CM

## 2012-02-11 ENCOUNTER — Ambulatory Visit
Admission: RE | Admit: 2012-02-11 | Discharge: 2012-02-11 | Disposition: A | Discharge status: Home / Self Care | Payer: 59 | Source: Ambulatory Visit | Attending: Gynecology | Admitting: Gynecology

## 2012-02-11 DIAGNOSIS — Z1231 Encounter for screening mammogram for malignant neoplasm of breast: Secondary | ICD-10-CM

## 2012-08-31 ENCOUNTER — Other Ambulatory Visit: Payer: Self-pay | Admitting: Internal Medicine

## 2012-09-20 ENCOUNTER — Telehealth: Payer: Self-pay

## 2012-09-20 NOTE — Telephone Encounter (Signed)
Pt called and is requesting a refill on her Nexium 40mg  prescription. Last EGD done 5/13, last OV 5/12. Please advise regarding refill. Pt states she is feeling fine and having no problems.

## 2012-09-21 NOTE — Telephone Encounter (Signed)
Ok to refill one tear. Thanks

## 2012-09-21 NOTE — Telephone Encounter (Signed)
Pt aware. Call placed to [facility] pharmacy and pt still has refills on script. Called pt and let her know to call her pharmacy.

## 2013-05-03 ENCOUNTER — Other Ambulatory Visit: Payer: Self-pay

## 2013-05-03 DIAGNOSIS — Z1231 Encounter for screening mammogram for malignant neoplasm of breast: Secondary | ICD-10-CM

## 2013-05-08 ENCOUNTER — Encounter (INDEPENDENT_AMBULATORY_CARE_PROVIDER_SITE_OTHER): Payer: Self-pay

## 2013-05-08 ENCOUNTER — Ambulatory Visit
Admission: RE | Admit: 2013-05-08 | Discharge: 2013-05-08 | Disposition: A | Discharge status: Home / Self Care | Payer: 59 | Source: Ambulatory Visit

## 2013-05-08 DIAGNOSIS — Z1231 Encounter for screening mammogram for malignant neoplasm of breast: Secondary | ICD-10-CM

## 2013-07-30 ENCOUNTER — Other Ambulatory Visit: Payer: Self-pay | Admitting: Internal Medicine

## 2013-10-18 ENCOUNTER — Encounter: Payer: Self-pay | Admitting: Internal Medicine

## 2013-10-23 ENCOUNTER — Telehealth: Payer: Self-pay

## 2013-10-23 MED ORDER — ESOMEPRAZOLE MAGNESIUM 40 MG PO CPDR: 40.0000 mg | DELAYED_RELEASE_CAPSULE | Freq: Every day | ORAL | Status: DC

## 2013-10-23 NOTE — Telephone Encounter (Signed)
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 Last EGD done 5/13, last OV 5/12. Nexium last refilled 09/2012 one year. Patient states she feels fine, having no problems, wanting a refill without office visit. Please advise.   Refilled Nexium per Dr. Henrene Pastor.  Will call patient and let her know.

## 2013-10-23 NOTE — Telephone Encounter (Signed)
Message copied by Audrea Muscat on Mon Oct 23, 2013 10:39 AM ------      Message from: Irene Shipper      Created: Mon Oct 23, 2013 10:09 AM       Ok. 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   Last EGD done 5/13, last OV 5/12.  Nexium last refilled 09/2012 one year.  Patient states she feels fine, having no problems, wanting a refill without office visit.  Please advise.       ------

## 2014-01-05 DIAGNOSIS — Z853 Personal history of malignant neoplasm of breast: Secondary | ICD-10-CM

## 2014-01-05 HISTORY — DX: Personal history of malignant neoplasm of breast: Z85.3

## 2014-04-10 ENCOUNTER — Encounter: Payer: Self-pay | Admitting: Internal Medicine

## 2014-05-04 ENCOUNTER — Other Ambulatory Visit: Payer: Self-pay | Admitting: Internal Medicine

## 2014-05-18 ENCOUNTER — Other Ambulatory Visit: Payer: Self-pay

## 2014-05-18 DIAGNOSIS — Z1231 Encounter for screening mammogram for malignant neoplasm of breast: Secondary | ICD-10-CM

## 2014-06-18 ENCOUNTER — Ambulatory Visit
Admission: RE | Admit: 2014-06-18 | Discharge: 2014-06-18 | Disposition: A | Discharge status: Home / Self Care | Payer: 59 | Source: Ambulatory Visit

## 2014-06-18 DIAGNOSIS — Z1231 Encounter for screening mammogram for malignant neoplasm of breast: Secondary | ICD-10-CM

## 2014-06-20 ENCOUNTER — Other Ambulatory Visit: Payer: Self-pay | Admitting: Gynecology

## 2014-06-20 DIAGNOSIS — R928 Other abnormal and inconclusive findings on diagnostic imaging of breast: Secondary | ICD-10-CM

## 2014-06-25 ENCOUNTER — Ambulatory Visit
Admission: RE | Admit: 2014-06-25 | Discharge: 2014-06-25 | Disposition: A | Discharge status: Home / Self Care | Payer: 59 | Source: Ambulatory Visit | Attending: Gynecology | Admitting: Gynecology

## 2014-06-25 ENCOUNTER — Other Ambulatory Visit: Payer: Self-pay | Admitting: Gynecology

## 2014-06-25 DIAGNOSIS — R928 Other abnormal and inconclusive findings on diagnostic imaging of breast: Secondary | ICD-10-CM

## 2014-07-03 ENCOUNTER — Ambulatory Visit
Admission: RE | Admit: 2014-07-03 | Discharge: 2014-07-03 | Disposition: A | Discharge status: Home / Self Care | Payer: 59 | Source: Ambulatory Visit | Attending: Gynecology | Admitting: Gynecology

## 2014-07-03 ENCOUNTER — Other Ambulatory Visit: Payer: Self-pay | Admitting: Gynecology

## 2014-07-03 DIAGNOSIS — R928 Other abnormal and inconclusive findings on diagnostic imaging of breast: Secondary | ICD-10-CM

## 2014-07-03 DIAGNOSIS — Z9889 Other specified postprocedural states: Secondary | ICD-10-CM

## 2014-07-04 ENCOUNTER — Ambulatory Visit
Admission: RE | Admit: 2014-07-04 | Discharge: 2014-07-04 | Disposition: A | Discharge status: Home / Self Care | Payer: 59 | Source: Ambulatory Visit | Attending: Gynecology | Admitting: Gynecology

## 2014-07-04 DIAGNOSIS — Z9889 Other specified postprocedural states: Secondary | ICD-10-CM

## 2014-07-05 ENCOUNTER — Telehealth: Payer: Self-pay | Admitting: *Deleted

## 2014-07-05 DIAGNOSIS — C50411 Malignant neoplasm of upper-outer quadrant of right female breast: Secondary | ICD-10-CM

## 2014-07-06 ENCOUNTER — Encounter: Payer: Self-pay | Admitting: *Deleted

## 2014-07-06 DIAGNOSIS — Z170 Estrogen receptor positive status [ER+]: Secondary | ICD-10-CM

## 2014-07-06 DIAGNOSIS — C50411 Malignant neoplasm of upper-outer quadrant of right female breast: Secondary | ICD-10-CM | POA: Insufficient documentation

## 2014-07-06 NOTE — Telephone Encounter (Signed)
Confirmed [facility] for 07/11/14 at 0800 .  Instructions and contact information given. 

## 2014-07-09 NOTE — Progress Notes (Signed)
[facility]  [phone] Fax:([phone]     ID: Marie Pierce DOB: 1955-08-21  MR#: [id number]  HEN#:[id number]  Patient Care Team: No Pcp Per Patient as PCP - General (General Practice) Anastasio Auerbach, MD as Consulting Physician (Gynecology) [name], MD as Consulting Physician (General Surgery) Chauncey Cruel, MD as Consulting Physician (Oncology) Arloa Koh, MD as Consulting Physician (Radiation Oncology) Mauro Kaufmann, RN as Registered Nurse Rockwell Germany, RN as Registered Nurse PCP: No PCP Per Patient OTHER MD:  CHIEF COMPLAINT:  Estrogen receptor positive breast cancer  CURRENT TREATMENT:  Awaiting definitive surgery   BREAST CANCER HISTORY: "[name]" had bilateral screening mammography with tomography at the breast Center 06/18/2014. This showed a possible area of distortion in the right breast. On 06/25/2014 she was recalled for bilateral diagnostic mammography with tomography and right breast ultrasonography. The breast density was category C. In the right breast at 11:00 there was a persistent area of distortion which was not palpable by exam. Ultrasound confirmed a subtle hypoechoic mass in the area in question measuring 0.6 cm. Ultrasound of the right axilla was normal.  On 07/03/2014 the patient underwent right breast upper outer quadrant biopsy, with the pathology ([id number]) showing invasive ductal carcinoma, grade 1, estrogen receptor 100% positive, progesterone receptor 20% positive, both with strong staining intensity, with an MIB-1 of 10%, and HER-2 pending.  Her subsequent history is as detailed below.  INTERVAL HISTORY:  [name] was evaluated in the multidisciplinary breast cancer clinic 07/11/2014 accompanied by her husband Skip and her daughter Bella Kennedy. Her case was also presented at the multidisciplinary breast cancer conference that same morning. At that time a preliminary plan was proposed: Breast conserving surgery  with sentinel lymph node sampling, followed by Oncotype , then radiation , then antiestrogen's. It was felt the patient would benefit from genetics evaluation  REVIEW OF SYSTEMS: There were no specific symptoms leading to the original mammogram, which was routinely scheduled. The patient denies unusual headaches, visual changes, nausea, vomiting, stiff neck, dizziness, or gait imbalance. There has been no cough, phlegm production, or pleurisy, no chest pain or pressure, and no change in bowel or bladder habits. The patient denies fever, rash, bleeding, unexplained fatigue or unexplained weight loss. A detailed review of systems was otherwise entirely negative.  PAST MEDICAL HISTORY: Past Medical History  Diagnosis Date  . Elevated blood pressure reading without diagnosis of hypertension     pt denies hypertension  . GERD (gastroesophageal reflux disease)   . Arthritis   . Gastric ulcer   . Breast cancer of upper-outer quadrant of right female breast 07/06/2014  . Breast cancer     PAST SURGICAL HISTORY: Past Surgical History  Procedure Laterality Date  . Abdominal hysterectomy    . Cesarean section    . Hemorrhoid surgery    . Foot surgery      Left  . Tonsillectomy    . Breast biopsy      left x2; right x 1  . Colonoscopy    . Polypectomy      FAMILY HISTORY Family History  Problem Relation Age of Onset  . Colon cancer Father 62  . Stomach cancer Maternal Aunt 60  . Diabetes Mother   . Heart disease Mother    The patient's parents are still living, in their early 19s per the patient had 2 brothers, no sisters. The patient's father had colon cancer at age 22. One maternal aunt (out of 63) and  the maternal grandmother were both diagnosed with breast cancer over the age of 38.  GYNECOLOGIC HISTORY:  No LMP recorded. Patient has had a hysterectomy.  menarche age 59, first live birth at 59, which increases the risk of breast cancer developing. The patient is GX P2. She is  postmenopausal and did not take hormone replacement. 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 Her husband Lasel "Skip" Pascarella  Is a Chief Strategy Officer. Daughter [name] is a Pharmacist, hospital in [place] son [name] is studying at [employer] (Event organiser).    ADVANCED DIRECTIVES:  Not in place   HEALTH MAINTENANCE: History  Substance Use Topics  . Smoking status: Never Smoker   . Smokeless tobacco: Never Used  . Alcohol Use: Yes     Comment: rare     Colonoscopy: repeat due 2016  PAP:  Bone density:  Lipid panel:  Allergies  Allergen Reactions  . Morphine And Related Hives    hives  . Sulfonamide Derivatives Hives    REACTION: hives  . Ultram [Tramadol Hcl] Other (See Comments)    Pt reports she was very dizzy with this medication  . Penicillins Swelling and Rash    Swelling throat    Current Outpatient Prescriptions  Medication Sig Dispense Refill  . acetaminophen (TYLENOL) 500 MG tablet Take 1,000 mg by mouth every 6 (six) hours as needed.      [name] BIOTIN PO Take 1 tablet by mouth daily.    . calcium carbonate (OS-CAL) 600 MG TABS tablet Take 600 mg by mouth 2 (two) times daily with a meal.    . cholecalciferol (VITAMIN D) 1000 UNITS tablet Take 1,000 Units by mouth daily.    [name] esomeprazole (NEXIUM) 40 MG capsule Take 1 capsule (40 mg total) by mouth daily before breakfast. 30 capsule 6  . fluticasone (FLONASE) 50 MCG/ACT nasal spray Place 2 sprays into both nostrils daily.  11  . glucosamine-chondroitin 500-400 MG tablet Take 1 tablet by mouth 2 (two) times daily.    . Multiple Vitamin (MULTIVITAMIN) tablet Take 1 tablet by mouth daily.    . vitamin C (ASCORBIC ACID) 500 MG tablet Take 500 mg by mouth daily.     No current facility-administered medications for this visit.    OBJECTIVE:  Middle-aged white woman in no acute distress Filed  Vitals:   07/11/14 0900  BP: 147/64  Pulse: 72  Temp: 98.4 F (36.9 C)  Resp: 18     Body mass index is 30.37 kg/(m^2).    ECOG FS:0 - Asymptomatic  Ocular: Sclerae unicteric, pupils equal, round and reactive to light Ear-nose-throat: Oropharynx clear and moist Lymphatic: No cervical or supraclavicular adenopathy Lungs no rales or rhonchi, good excursion bilaterally Heart regular rate and rhythm, no murmur appreciated Abd soft, nontender, positive bowel sounds MSK no focal spinal tenderness, no joint edema Neuro: non-focal, well-oriented, appropriate affect Breasts:  The right breast is status post recent biopsy. There is a minimal ecchymosis. There is no palpable mass. The right axilla is benign. Left breast is unremarkable   LAB RESULTS:  CMP     Component Value Date/Time   NA 144 07/11/2014 0834   NA 142 05/16/2010 1522   K 4.0 07/11/2014 0834   K 4.3 05/16/2010 1522   CL 103 05/16/2010 1522   CO2 28 07/11/2014 0834   CO2 30 05/16/2010 1522  GLUCOSE 88 07/11/2014 0834   GLUCOSE 86 05/16/2010 1522   BUN 13.6 07/11/2014 0834   BUN 9 05/16/2010 1522   CREATININE 0.8 07/11/2014 0834   CREATININE 0.8 05/16/2010 1522   CALCIUM 9.8 07/11/2014 0834   CALCIUM 9.4 05/16/2010 1522   PROT 6.8 07/11/2014 0834   PROT 6.8 05/16/2010 1522   ALBUMIN 4.1 07/11/2014 0834   ALBUMIN 4.3 05/16/2010 1522   AST 20 07/11/2014 0834   AST 31 05/16/2010 1522   ALT 25 07/11/2014 0834   ALT 44* 05/16/2010 1522   ALKPHOS 107 07/11/2014 0834   ALKPHOS 76 05/16/2010 1522   BILITOT 0.45 07/11/2014 0834   BILITOT 0.8 05/16/2010 1522    INo results found for: SPEP, UPEP  Lab Results  Component Value Date   WBC 6.1 07/11/2014   NEUTROABS 3.7 07/11/2014   HGB 15.3 07/11/2014   HCT 45.6 07/11/2014   MCV 94.8 07/11/2014   PLT 180 07/11/2014      Chemistry      Component Value Date/Time   NA 144 07/11/2014 0834   NA 142 05/16/2010 1522   K 4.0 07/11/2014 0834   K 4.3 05/16/2010  1522   CL 103 05/16/2010 1522   CO2 28 07/11/2014 0834   CO2 30 05/16/2010 1522   BUN 13.6 07/11/2014 0834   BUN 9 05/16/2010 1522   CREATININE 0.8 07/11/2014 0834   CREATININE 0.8 05/16/2010 1522      Component Value Date/Time   CALCIUM 9.8 07/11/2014 0834   CALCIUM 9.4 05/16/2010 1522   ALKPHOS 107 07/11/2014 0834   ALKPHOS 76 05/16/2010 1522   AST 20 07/11/2014 0834   AST 31 05/16/2010 1522   ALT 25 07/11/2014 0834   ALT 44* 05/16/2010 1522   BILITOT 0.45 07/11/2014 0834   BILITOT 0.8 05/16/2010 1522       No results found for: LABCA2  No components found for: LABCA125  No results for input(s): INR in the last 168 hours.  Urinalysis No results found for: COLORURINE, APPEARANCEUR, LABSPEC, PHURINE, GLUCOSEU, HGBUR, BILIRUBINUR, KETONESUR, PROTEINUR, UROBILINOGEN, NITRITE, LEUKOCYTESUR  STUDIES: Mm Radiologist Eval And Mgmt  07/04/2014   EXAM: ESTABLISHED PATIENT OFFICE VISIT - LEVEL II  CHIEF COMPLAINT: Patient returns for results following right breast stereotactic/tomosynthesis guided biopsy. The patient is accompanied by her daughter.  Current Pain Level: 2  HISTORY OF PRESENT ILLNESS: The patient was recalled from screening mammography for a subtle area of distortion within the right breast. Additional evaluation demonstrated a small persistent area of distortion within the superior right breast and right breast stereotactic/tomosynthesis guided biopsy was recommended and performed. The patient returns for results. There is a family history of breast cancer (Postmenopausal) in a maternal aunt.  EXAM: Patient's breast examination demonstrates no hematoma formation or signs of infection.  PATHOLOGY: The pathology demonstrated invasive ductal carcinoma felt to be grade 1 with lobular neoplasia. The pathology is concordant with the imaging findings.  ASSESSMENT AND PLAN: ASSESSMENT AND PLAN I have discussed the findings with the patient and answered her questions. The patient is  scheduled to be seen at the breast cancer multi disciplinary clinic at the [facility] on Wednesday, 07/11/2014. Post biopsy wound care instructions were reviewed with the patient. The patient was encouraged to contact our [facility] for additional questions or concerns.   Electronically Signed   By: Altamese Cabal M.D.   On: 07/04/2014 14:39   US Breast Ltd Uni Right Inc Axilla  06/25/2014  CLINICAL DATA:  Recall from screening mammogram.  EXAM: DIGITAL DIAGNOSTIC RIGHT MAMMOGRAM WITH 3D TOMOSYNTHESIS  ULTRASOUND RIGHT BREAST  COMPARISON:  06/18/2014, 05/08/2013, 02/11/2012, 12/04/2010.  ACR Breast Density Category c: The breast tissue is heterogeneously dense, which may obscure small masses.  FINDINGS: There is a persistent subtle area of distortion located superiorly within the right breast at approximately the 11 o'clock position.  On physical exam, there is no discrete palpable abnormality.  Targeted ultrasound is performed, showing a subtle irregular hypoechoic mass located within the right breast at the 11 o'clock position 9 cm from the nipple with shadowing. This measures 6 x 5 x 5 mm in size. Most likely this represents the mammographic finding. Tissue sampling is recommended. However, I believe this would be more accurately sampled by 3D guidance and this will be scheduled as a stereotactic (Affirm) biopsy. This has been discussed with the patient.  Ultrasound of the right axilla demonstrates normal axillary contents and no evidence for adenopathy.  IMPRESSION: Subtle small area of distortion located superiorly within the right breast at the 11 o'clock position. Tissue sampling is recommended and stereotactic biopsy will be scheduled.  RECOMMENDATION: Right breast stereotactic biopsy.  I have discussed the findings and recommendations with the patient. Results were also provided in writing at the conclusion of the visit. If applicable, a reminder letter will be sent to the patient  regarding the next appointment.  BI-RADS CATEGORY  4: Suspicious.   Electronically Signed   By: Altamese Cabal M.D.   On: 06/25/2014 15:07   Mm Diag Breast Tomo Uni Right  07/03/2014   CLINICAL DATA:  Post right breast stereotactic biopsy.  EXAM: DIAGNOSTIC RIGHT MAMMOGRAM POST STEREOTACTIC BIOPSY  COMPARISON:  Previous exam(s).  FINDINGS: Mammographic images were obtained following stereotactic guided biopsy of the area of distortion located superiorly within the upper-outer quadrant of the right breast. The coil shaped clip is in appropriate position.  IMPRESSION: Appropriate position of clip following right breast stereotactic biopsy.  Final Assessment: Post Procedure Mammograms for Marker Placement   Electronically Signed   By: Altamese Cabal M.D.   On: 07/03/2014 10:51   Mm Diag Breast Tomo Uni Right  06/25/2014   CLINICAL DATA:  Recall from screening mammogram.  EXAM: DIGITAL DIAGNOSTIC RIGHT MAMMOGRAM WITH 3D TOMOSYNTHESIS  ULTRASOUND RIGHT BREAST  COMPARISON:  06/18/2014, 05/08/2013, 02/11/2012, 12/04/2010.  ACR Breast Density Category c: The breast tissue is heterogeneously dense, which may obscure small masses.  FINDINGS: There is a persistent subtle area of distortion located superiorly within the right breast at approximately the 11 o'clock position.  On physical exam, there is no discrete palpable abnormality.  Targeted ultrasound is performed, showing a subtle irregular hypoechoic mass located within the right breast at the 11 o'clock position 9 cm from the nipple with shadowing. This measures 6 x 5 x 5 mm in size. Most likely this represents the mammographic finding. Tissue sampling is recommended. However, I believe this would be more accurately sampled by 3D guidance and this will be scheduled as a stereotactic (Affirm) biopsy. This has been discussed with the patient.  Ultrasound of the right axilla demonstrates normal axillary contents and no evidence for adenopathy.  IMPRESSION:  Subtle small area of distortion located superiorly within the right breast at the 11 o'clock position. Tissue sampling is recommended and stereotactic biopsy will be scheduled.  RECOMMENDATION: Right breast stereotactic biopsy.  I have discussed the findings and recommendations with the patient. Results were also provided in writing at  the conclusion of the visit. If applicable, a reminder letter will be sent to the patient regarding the next appointment.  BI-RADS CATEGORY  4: Suspicious.   Electronically Signed   By: Altamese Cabal M.D.   On: 06/25/2014 15:07   Mm Screening Breast Tomo Bilateral  06/19/2014   CLINICAL DATA:  Screening.  EXAM: DIGITAL SCREENING BILATERAL MAMMOGRAM WITH 3D TOMO WITH CAD  COMPARISON:  Previous exam(s).  ACR Breast Density Category c: The breast tissue is heterogeneously dense, which may obscure small masses.  FINDINGS: In the right breast, possible distortion warrants further evaluation. In the left breast, no findings suspicious for malignancy. Images were processed with CAD.  IMPRESSION: Further evaluation is suggested for possible distortion in the right breast.  RECOMMENDATION: Diagnostic mammogram and possibly ultrasound of the right breast. (Code:[id number])  The patient will be contacted regarding the findings, and additional imaging will be scheduled.  BI-RADS CATEGORY  0: Incomplete. Need additional imaging evaluation and/or prior mammograms for comparison.   Electronically Signed   By: Altamese Cabal M.D.   On: 06/19/2014 15:47   Mm Rt Breast Bx W Loc Dev 1st Lesion Image Bx Spec Stereo Guide  07/03/2014   CLINICAL DATA:  Right breast distortion.  EXAM: RIGHT BREAST STEREOTACTIC CORE NEEDLE BIOPSY  COMPARISON:  Previous exams.  FINDINGS: The patient and I discussed the procedure of stereotactic-guided biopsy including benefits and alternatives. We discussed the high likelihood of a successful procedure. We discussed the risks of the procedure including infection,  bleeding, tissue injury, clip migration, and inadequate sampling. Informed written consent was given. The usual time out protocol was performed immediately prior to the procedure.  Using sterile technique and 2% lidocaine as local anesthetic, under stereotactic guidance, a 9 gauge vacuum assisted device was used to perform core needle biopsy of the area of distortion located superiorly within the right breast within the upper-outer quadrant using a superior craniocaudal approach. No specimen mammogram was obtained.  At the conclusion of the procedure, a coil shaped tissue marker clip was deployed into the biopsy cavity. Follow-up 2-view mammogram was performed and dictated separately.  IMPRESSION: Stereotactic-guided biopsy of area of distortion located superiorly within the right breast as discussed above. No apparent complications.   Electronically Signed   By: Altamese Cabal M.D.   On: 07/03/2014 10:49    ASSESSMENT: 59 y.o. [place] woman status post right breast upper outer quadrant biopsy 07/03/2014 for a clinical T1b N0, stage IA invasive ductal carcinoma, grade 1, estrogen and progesterone receptor positive, with an MIB-1 of 10% and HER-2 pending.    (1) breast conserving surgery with sentinel lymph node sampling pending  (2) adjuvant radiation to follow surgery  (3) Oncotype to be sent from the definitive surgical sample  (4) antiestrogen therapy to begin at the completion of local treatment  PLAN: We spent the better part of today's hour-long appointment discussing the biology of breast cancer in general, and the specifics of the patient's tumor in particular. [name] understands she has a very small, nonaggressive, slow-growing breast cancer. These tumors generally do not benefit from chemotherapy. However because she is under 60 I am still going to be sending an Oncotype test to make 100% sure. Of course we will not send that if she proves to be node positive or HER-2 positive, both of  which appear highly doubtful.  She has a good understanding of the difference between local and systemic therapy for breast cancer. From a systemic therapy point of view she  will agree greatly benefit from anti-estrogens. She will not benefit from anti-HER-2 treatment. The chemotherapy question will be decided by the Oncotype which we expect to be low.  She is comfortable with the decision for breast conservation followed by radiation which she knows is our standard recommendation and from his survival point of view is equivalent to mastectomy.  I'm going to see her again in approximately 2-1/2 months. By that time she should be done with her radiation and we will be able to start anti-estrogens, most likely tamoxifen since she is status post hysterectomy. The patient has a good understanding of the overall plan. She agrees with it. She knows the goal of treatment in her case is cure. She will call with any problems that may develop before her next visit here.  Chauncey Cruel, MD   07/11/2014 12:40 PM Medical Oncology and Hematology [facility] [address] [place], [place] [address] Tel. [phone]    Fax. [phone]

## 2014-07-11 ENCOUNTER — Encounter: Payer: Self-pay | Admitting: Skilled Nursing Facility1

## 2014-07-11 ENCOUNTER — Encounter: Payer: Self-pay | Admitting: Oncology

## 2014-07-11 ENCOUNTER — Ambulatory Visit (HOSPITAL_BASED_OUTPATIENT_CLINIC_OR_DEPARTMENT_OTHER): Payer: 59

## 2014-07-11 ENCOUNTER — Encounter: Payer: Self-pay | Admitting: *Deleted

## 2014-07-11 ENCOUNTER — Ambulatory Visit: Payer: 59 | Attending: General Surgery | Admitting: Physical Therapy

## 2014-07-11 ENCOUNTER — Other Ambulatory Visit (HOSPITAL_BASED_OUTPATIENT_CLINIC_OR_DEPARTMENT_OTHER): Payer: 59

## 2014-07-11 ENCOUNTER — Ambulatory Visit (HOSPITAL_BASED_OUTPATIENT_CLINIC_OR_DEPARTMENT_OTHER): Payer: 59 | Admitting: Oncology

## 2014-07-11 ENCOUNTER — Other Ambulatory Visit: Payer: Self-pay | Admitting: General Surgery

## 2014-07-11 ENCOUNTER — Ambulatory Visit
Admission: RE | Admit: 2014-07-11 | Discharge: 2014-07-11 | Disposition: A | Discharge status: Home / Self Care | Payer: 59 | Source: Ambulatory Visit | Attending: Radiation Oncology | Admitting: Radiation Oncology

## 2014-07-11 ENCOUNTER — Encounter: Payer: Self-pay | Admitting: Physical Therapy

## 2014-07-11 VITALS — BP 147/64 | HR 72 | Temp 98.4°F | Resp 18 | Ht 62.0 in | Wt 166.1 lb

## 2014-07-11 DIAGNOSIS — C50411 Malignant neoplasm of upper-outer quadrant of right female breast: Secondary | ICD-10-CM

## 2014-07-11 DIAGNOSIS — Z170 Estrogen receptor positive status [ER+]: Secondary | ICD-10-CM

## 2014-07-11 DIAGNOSIS — R293 Abnormal posture: Secondary | ICD-10-CM

## 2014-07-11 LAB — COMPREHENSIVE METABOLIC PANEL (CC13)
ALBUMIN: 4.1 g/dL (ref 3.5–5.0)
ALK PHOS: 107 U/L (ref 40–150)
ALT: 25 U/L (ref 0–55)
AST: 20 U/L (ref 5–34)
Anion Gap: 9 mEq/L (ref 3–11)
BILIRUBIN TOTAL: 0.45 mg/dL (ref 0.20–1.20)
BUN: 13.6 mg/dL (ref 7.0–26.0)
CO2: 28 mEq/L (ref 22–29)
CREATININE: 0.8 mg/dL (ref 0.6–1.1)
Calcium: 9.8 mg/dL (ref 8.4–10.4)
Chloride: 106 mEq/L (ref 98–109)
EGFR: 88 mL/min/{1.73_m2} — ABNORMAL LOW (ref >90)
GLUCOSE: 88 mg/dL (ref 70–140)
Potassium: 4 mEq/L (ref 3.5–5.1)
Sodium: 144 mEq/L (ref 136–145)
Total Protein: 6.8 g/dL (ref 6.4–8.3)

## 2014-07-11 LAB — CBC WITH DIFFERENTIAL/PLATELET
BASO%: 0.5 % (ref 0.0–2.0)
Basophils Absolute: 0 10*3/uL (ref 0.0–0.1)
EOS%: 1.7 % (ref 0.0–7.0)
Eosinophils Absolute: 0.1 10*3/uL (ref 0.0–0.5)
HEMATOCRIT: 45.6 % (ref 34.8–46.6)
HGB: 15.3 g/dL (ref 11.6–15.9)
LYMPH#: 1.8 10*3/uL (ref 0.9–3.3)
LYMPH%: 29.7 % (ref 14.0–49.7)
MCH: 31.9 pg (ref 25.1–34.0)
MCHC: 33.6 g/dL (ref 31.5–36.0)
MCV: 94.8 fL (ref 79.5–101.0)
MONO#: 0.4 10*3/uL (ref 0.1–0.9)
MONO%: 7.1 % (ref 0.0–14.0)
NEUT#: 3.7 10*3/uL (ref 1.5–6.5)
NEUT%: 61 % (ref 38.4–76.8)
Platelets: 180 10*3/uL (ref 145–400)
RBC: 4.81 10*6/uL (ref 3.70–5.45)
RDW: 12.9 % (ref 11.2–14.5)
WBC: 6.1 10*3/uL (ref 3.9–10.3)

## 2014-07-11 NOTE — Progress Notes (Signed)
Checked in new pt with no financial concerns prior to seeing the dr.  Informed pt if chemo is part of her treatment we will contact her ins to see if auth is required and will obtain it if it is as well as contact foundations that offer copay assistance for chemo if needed.  She has my card for any questions or concerns. °

## 2014-07-11 NOTE — Therapy (Signed)
[facility] [place], [place], [address] Phone: [phone]   Fax:  [phone]  Physical Therapy Evaluation  Patient Details  Name: Marie Pierce MRN: [id number] Date of Birth: [birth year] Referring Provider:  [name], MD  Encounter Date: 07/11/2014      PT End of Session - 07/11/14 1527    Visit Number 1   Number of Visits 1   PT Start Time 0953   PT Stop Time 1020   PT Time Calculation (min) 27 min   Activity Tolerance Patient tolerated treatment well   Behavior During Therapy [facility] for tasks assessed/performed      Past Medical History  Diagnosis Date  . Elevated blood pressure reading without diagnosis of hypertension     pt denies hypertension  . GERD (gastroesophageal reflux disease)   . Arthritis   . Gastric ulcer   . Breast cancer of upper-outer quadrant of right female breast 07/06/2014  . Breast cancer     Past Surgical History  Procedure Laterality Date  . Abdominal hysterectomy    . Cesarean section    . Hemorrhoid surgery    . Foot surgery      Left  . Tonsillectomy    . Breast biopsy      left x2; right x 1  . Colonoscopy    . Polypectomy      There were no vitals filed for this visit.  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  It is located in the upper outer quadrant and measures 6 mm.  It is ER/PR positive and HER2 pending with a Ki67 of 10%.              [facility] PT Assessment - 07/11/14 0001    Assessment   Medical Diagnosis right breast cancer   Onset Date/Surgical Date 07/04/14   Hand Dominance Right   Prior Therapy none   Precautions   Precautions Other (comment)  active breast  cancer   Restrictions   Weight Bearing Restrictions No   Balance Screen   Has the patient fallen in the past 6 months No   Has the patient had a decrease in activity level because of a fear of falling?  No   Is the patient reluctant to leave their home because of a fear of falling?  No   Home Environment   Living Environment Private residence   Living Arrangements Spouse/significant other;Children  Husband and 83 y.o. 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   Cognition   Overall Cognitive Status Within Functional Limits for tasks assessed   Posture/Postural Control   Posture/Postural Control Postural limitations   Postural Limitations Rounded Shoulders;Forward head   ROM / Strength   AROM / PROM / Strength AROM;Strength   AROM   AROM Assessment Site Shoulder   Right/Left Shoulder Right;Left   Right Shoulder Extension 58 Degrees   Right Shoulder Flexion 152 Degrees   Right Shoulder ABduction 148 Degrees   Right Shoulder Internal Rotation 81  Degrees   Right Shoulder External Rotation 81 Degrees   Left Shoulder Extension 46 Degrees   Left Shoulder Flexion 150 Degrees   Left Shoulder ABduction 150 Degrees   Left Shoulder Internal Rotation 60 Degrees   Left Shoulder External Rotation 80 Degrees   Strength   Overall Strength Within functional limits for tasks performed           LYMPHEDEMA/ONCOLOGY QUESTIONNAIRE - 07/11/14 1523    Type   Cancer Type Right breast cancer   Lymphedema Assessments   Lymphedema Assessments Upper extremities   Right Upper Extremity Lymphedema   10 cm Proximal to Olecranon Process 30.3 cm   Olecranon Process 25 cm   10 cm Proximal to Ulnar Styloid Process 22.6 cm   Just Proximal to Ulnar Styloid Process 15 cm   Across Hand at [employer] 18 cm   At [place] of 2nd Digit  6.3 cm   Left Upper Extremity Lymphedema   10 cm Proximal to Olecranon Process 30.6 cm   Olecranon Process 24.5 cm   10 cm Proximal to Ulnar Styloid Process 22.3 cm   Just Proximal to Ulnar Styloid Process 14.9 cm   Across Hand at [employer] 18.8 cm   At [place] of 2nd Digit 6.3 cm       Patient was instructed today in a home exercise program today for post op shoulder range of motion. These included active assist shoulder flexion in sitting, scapular retraction, wall walking with shoulder abduction, and hands behind head external rotation.  She was encouraged to do these twice a day, holding 3 seconds and repeating 5 times when permitted by her physician.         PT Education - 07/11/14 1526    Education provided Yes   Education Details Lymphedema risk reduction and post op shoulder ROM HEP   Person(s) Educated Patient   Methods Explanation;Demonstration;Handout   Comprehension Verbalized understanding;Returned demonstration              Breast Clinic Goals - 07/11/14 1530    Patient will be able to verbalize understanding of pertinent lymphedema risk reduction practices relevant to her diagnosis specifically related to skin care.   Time 1   Period Days   Status Achieved   Patient will be able to return demonstrate and/or verbalize understanding of the post-op home exercise program related to regaining shoulder range of motion.   Time 1   Period Days   Status Achieved   Patient will be able to verbalize understanding of the importance of attending the postoperative After Breast Cancer Class for further lymphedema risk reduction education and therapeutic exercise.   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  It is located in the upper outer quadrant and measures 6 mm.  It is ER/PR positive and HER2 pending  with a Ki67 of 10%.  She is planning to have a right lumpectomy and sentinel node biopsy followed by radiation and anti-estrogen therapy.  She will benefit from post op PT to regain shoulder ROM and strength and reduce lymphedema risk.   Pt will benefit from skilled therapeutic intervention in order to improve on the following deficits Decreased range of motion;Impaired UE functional use;Pain;Decreased knowledge of precautions;Decreased strength;Postural dysfunction   Rehab Potential  Excellent   Clinical Impairments Affecting Rehab Potential None   PT Frequency One time visit   PT Treatment/Interventions Patient/family education;Therapeutic exercise   Consulted and Agree with Plan of Care Patient;Family member/caregiver   Family Member Consulted husband     Patient will follow up at outpatient cancer rehab if needed following surgery.  If the patient requires physical therapy at that time, a specific plan will be dictated and sent to the referring physician for approval. The patient was educated today on appropriate basic range of motion exercises to begin post operatively and the importance of attending the After Breast Cancer class following surgery.  Patient was educated today on lymphedema risk reduction practices as it pertains to recommendations that will benefit the patient immediately following surgery.  She verbalized good understanding.  No additional physical therapy is indicated at this time.       Problem List Patient Active Problem List   Diagnosis Date Noted  . Breast cancer of upper-outer quadrant of right female breast 07/06/2014  . Abdominal pain, epigastric 05/18/2010  . INCREASED BLOOD PRESSURE 11/28/2008    Annia Friendly, PT 07/11/2014, 3:32 PM  [place], [place], [address] Phone: [phone]   Fax:  [phone]

## 2014-07-11 NOTE — Progress Notes (Signed)
[facility] NEW PATIENT EVALUATION  Name: Marie Pierce MRN: [id number]  Date:   07/11/2014           DOB: [birth year]  Status: outpatient   CC: No PCP Per Patient  [name], MD   REFERRING PHYSICIAN: [name], MD   DIAGNOSIS: Clinical stage I A (T1b N0 M0) invasive ductal carcinoma of the right breast   HISTORY OF PRESENT ILLNESS:  Marie Pierce is a 59 y.o. female who is seen today for the courtesy of Dr. Barry Dienes at the multidisciplinary breast clinic for evaluation of her T1b N0 invasive ductal carcinoma the right breast.  At the time of a screening mammogram at the [facility] on 06/18/2014 she was found have distortion within the right breast.  Additional views on 06/25/2014 showed distortion localized to 11:00 within the upper-outer quadrant.  Ultrasound showed a 0.6 x 0.5 x 0.5  cm mass.  The axilla was benign on ultrasound.  Ultrasound-guided biopsy on 07/03/2014 was diagnostic for invasive ductal carcinoma with calcifications.  This is felt to be grade 1.  Her tumor is ER positive at 100%, PR positive at 20% with a low Ki-67 of 10%.  HER-2/neu was pending and expected to be negative.  She is without complaints today.  PREVIOUS RADIATION THERAPY: No   PAST MEDICAL HISTORY:  has a past medical history of Elevated blood pressure reading without diagnosis of hypertension; GERD (gastroesophageal reflux disease); Arthritis; Gastric ulcer; Breast cancer of upper-outer quadrant of right female breast (07/06/2014); and Breast cancer.     PAST SURGICAL HISTORY:  Past Surgical History  Procedure Laterality Date  . Abdominal hysterectomy    . Cesarean section    . Hemorrhoid surgery    . Foot surgery      Left  . Tonsillectomy    . Breast biopsy      left x2; right x 1  . Colonoscopy    . Polypectomy       FAMILY HISTORY: family history includes Colon cancer (age of onset: 76) in her father; Diabetes in her mother; Heart disease in her  mother; Stomach cancer (age of onset: 2) in her maternal aunt.  Her maternal grandmother was diagnosed with breast cancer at age 31.  A paternal aunt was diagnosed of breast cancer in a postmenopausal age.    SOCIAL HISTORY:  reports that she has never smoked. She has never used smokeless tobacco. She reports that she drinks alcohol. She reports that she does not use illicit drugs.  Married, 2 children, daughter age 64 and son age 32.  She works as an or Marine scientist at the [employer].   ALLERGIES: Morphine and related; Sulfonamide derivatives; Ultram; and Penicillins   MEDICATIONS:  Current Outpatient Prescriptions  Medication Sig Dispense Refill  . acetaminophen (TYLENOL) 500 MG tablet Take 1,000 mg by mouth every 6 (six) hours as needed.      [name] BIOTIN PO Take 1 tablet by mouth daily.    . calcium carbonate (OS-CAL) 600 MG TABS tablet Take 600 mg by mouth 2 (two) times daily with a meal.    . cholecalciferol (VITAMIN D) 1000 UNITS tablet Take 1,000 Units by mouth daily.    [name] esomeprazole (NEXIUM) 40 MG capsule Take 1 capsule (40 mg total) by mouth daily before breakfast. 30 capsule 6  . fluticasone (FLONASE) 50 MCG/ACT nasal spray Place 2 sprays into both nostrils daily.  11  . glucosamine-chondroitin 500-400 MG tablet  Take 1 tablet by mouth 2 (two) times daily.    . Multiple Vitamin (MULTIVITAMIN) tablet Take 1 tablet by mouth daily.    . vitamin C (ASCORBIC ACID) 500 MG tablet Take 500 mg by mouth daily.     No current facility-administered medications for this encounter.     REVIEW OF SYSTEMS:  Pertinent items are noted in HPI.    PHYSICAL EXAM: Alert and oriented 59 year old white female appearing younger than her stated age. Wt Readings from Last 3 Encounters:  07/11/14 166 lb 1.6 oz (75.342 kg)  05/20/11 165 lb (74.844 kg)  04/15/11 165 lb (74.844 kg)   Temp Readings from Last 3 Encounters:  07/11/14 98.4 F (36.9 C) Oral  05/20/11 98.6 F (37 C) Tympanic   05/22/10 98.9 F (37.2 C)    BP Readings from Last 3 Encounters:  07/11/14 147/64  05/20/11 133/71  05/16/10 134/80   Pulse Readings from Last 3 Encounters:  07/11/14 72  05/20/11 78  05/22/10 85   Head and neck examination: Grossly unremarkable.  Nodes: Without palpable cervical, supraclavicular, or axillary lymphadenopathy.  Chest: Lungs clear.  Breasts: Was a biopsy wound with ecchymosis at approximately 11:00 along the upper-outer quadrant of the right breast.  No discreet masses are palpable.  Left breast without masses or lesions.  Extremities: Without edema.    LABORATORY DATA:  Lab Results  Component Value Date   WBC 6.1 07/11/2014   HGB 15.3 07/11/2014   HCT 45.6 07/11/2014   MCV 94.8 07/11/2014   PLT 180 07/11/2014   Lab Results  Component Value Date   NA 144 07/11/2014   K 4.0 07/11/2014   CL 103 05/16/2010   CO2 28 07/11/2014   Lab Results  Component Value Date   ALT 25 07/11/2014   AST 20 07/11/2014   ALKPHOS 107 07/11/2014   BILITOT 0.45 07/11/2014      IMPRESSION: Clinical stage I A (T1b N0 M0) invasive ductal carcinoma the right breast.  We discussed local management options which include mastectomy with implant reconstruction along with a sentinel lymph node biopsy or a partial mastectomy along with a sentinel lymph node biopsy followed by radiation therapy.  We discussed hypofractionated treatment versus standard fractionation.  She is large breasted and we will have to see if she is a candidate for hypofractionated treatment based on her field separation and dosimetry.  We discussed the potential acute and late toxicities of radiation therapy.  She desires breast preservation.  Her prognosis appears to be excellent.   PLAN: As discussed above.  I spent 30 minutes face to face with the patient and more than 50% of that time was spent in counseling and/or coordination of care.

## 2014-07-11 NOTE — Progress Notes (Signed)
Subjective:     Patient ID: Marie Pierce, female   DOB: 1955-08-16, 59 y.o.   MRN: [id number]  HPI   Review of Systems     Objective:   Physical Exam For the patient to understand and be given the tools to implement a healthy plant based diet during their cancer diagnosis.     Assessment:     Patient was seen today and found to be in good spirits and accompanied by her daughter and husband. Pts ht 5'2'' BMI 30.4, and 166 pounds. 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 Pts current/relevant medications: nexium, multivitamin, vitamin C, vitamin D, calcium + biotin, and glucosamine. Pt questioned the safety of soy.     Plan:     Dietitian educated the patient on implementing a plant based diet by incorporating more plant proteins, fruits, and vegetables. As a part of a healthy routine physical activity was discussed. Dietitian educated the pt on the research of soy and the recommendation of 1-2 servings daily.  The importance of legitimate, evidence based information was discussed and examples were given. A folder of evidence based information with a focus on a plant based diet and general nutrition during cancer was given to the patient.  As a part of the continuum of care the cancer dietitian's contact information was given to the patient in the event they would like to have a follow up appointment.

## 2014-07-11 NOTE — Progress Notes (Signed)
Clinical Social Work [facility] Psychosocial Distress Screening [facility]  Patient completed distress screening protocol and scored a 10 on the Psychosocial Distress Thermometer which indicates severe distress. Clinical Social Worker met with patient in [facility] to assess for distress and other psychosocial needs. Patient stated that although she was feeling overwhelmed she felt "better" after meeting with the treatment team and getting information on her treatment plan. CSW and patient discussed common feeling and emotions when being diagnosed with cancer, and the importance of support during treatment. CSW informed patient of the support team and support services at [facility]. CSW provided contact information and encouraged patient to call with any questions or concerns.   ONCBCN DISTRESS SCREENING 07/11/2014  Screening Type Initial Screening  Distress experienced in past week (1-10) 10  Family Problem type Other (comment)  Emotional problem type Nervousness/Anxiety;Adjusting to illness  Physical Problem type Nausea/vomiting  Physician notified of physical symptoms Yes  Referral to clinical psychology No  Referral to clinical social work Yes  Referral to dietition No  Referral to financial advocate No  Referral to support programs No  Referral to palliative care No   Abigail Elmore, MSW, LCSW, OSW-C Clinical Social Worker [facility] [phone]       

## 2014-07-11 NOTE — Progress Notes (Signed)
Marie Pierce is a very pleasant 59 y.o. female from [place], [place] with newly diagnosed grade 1 invasive ductal carcinoma of the right breast.  Biopsy results revealed the tumor's prognostic profile is ER positive, PR positive, and HER2/neu is pending. Ki67 is 10%.  She presents today with her husband and daughter to the [facility]) for treatment consideration and recommendations from the breast surgeon, radiation oncologist, and medical oncologist.     I briefly met with Marie Pierce and her family during her [facility] visit today. We discussed the purpose of the Survivorship Clinic, which will include monitoring for recurrence, coordinating completion of age and gender-appropriate cancer screenings, promotion of overall wellness, as well as managing potential late/long-term side effects of anti-cancer treatments.    The treatment plan for Marie Pierce will likely include surgery, radiation therapy, and anti-estrogen therapy.  She will meet with the [employer] as well.  As of today, the intent of treatment for Marie Pierce is cure, therefore she will be eligible for the Survivorship Clinic upon her completion of treatment.  Her survivorship care plan (SCP) document will be drafted and updated throughout the course of her treatment trajectory. She will receive the SCP in an office visit with myself in the Survivorship Clinic once she has completed treatment.   Marie Pierce was encouraged to ask questions and all questions were answered to her satisfaction.  She was given my business card and encouraged to contact me with any concerns regarding survivorship.  I look forward to participating in her care.   Mike Craze, NP [place] [phone]

## 2014-07-11 NOTE — Patient Instructions (Signed)

## 2014-07-12 ENCOUNTER — Encounter (HOSPITAL_BASED_OUTPATIENT_CLINIC_OR_DEPARTMENT_OTHER): Payer: Self-pay | Admitting: *Deleted

## 2014-07-13 ENCOUNTER — Ambulatory Visit
Admission: RE | Admit: 2014-07-13 | Discharge: 2014-07-13 | Disposition: A | Discharge status: Home / Self Care | Payer: 59 | Source: Ambulatory Visit | Attending: General Surgery | Admitting: General Surgery

## 2014-07-13 ENCOUNTER — Telehealth: Payer: Self-pay | Admitting: *Deleted

## 2014-07-13 ENCOUNTER — Other Ambulatory Visit: Payer: Self-pay | Admitting: General Surgery

## 2014-07-13 DIAGNOSIS — C50411 Malignant neoplasm of upper-outer quadrant of right female breast: Secondary | ICD-10-CM

## 2014-07-13 NOTE — Telephone Encounter (Signed)
Spoke to pt concerning [place] from 07/11/14. Denies questions or concerns regarding dx or treatment care plan. Confirmed surgery date and future appts. Encourage pt to call with needs. Received verbal understanding.

## 2014-07-13 NOTE — Telephone Encounter (Signed)
-----   Message from Mauro Kaufmann, RN sent at 07/13/2014 10:56 AM EDT ----- Regarding: [place] Team,  Ms Fehrman was seen in clinic on 07/11/14. She is scheduled for the following.  Genetics and surgery 7/12 F/u with Dr. Jana Hakim on 8/29  We will order and oncotype based on her final path.  Please let me know if you have questions.  Thanks, [employer]

## 2014-07-17 ENCOUNTER — Other Ambulatory Visit: Payer: 59

## 2014-07-17 ENCOUNTER — Ambulatory Visit
Admission: RE | Admit: 2014-07-17 | Discharge: 2014-07-17 | Disposition: A | Discharge status: Home / Self Care | Payer: 59 | Source: Ambulatory Visit | Attending: General Surgery | Admitting: General Surgery

## 2014-07-17 ENCOUNTER — Encounter (HOSPITAL_BASED_OUTPATIENT_CLINIC_OR_DEPARTMENT_OTHER): Payer: Self-pay | Admitting: Anesthesiology

## 2014-07-17 ENCOUNTER — Ambulatory Visit (HOSPITAL_BASED_OUTPATIENT_CLINIC_OR_DEPARTMENT_OTHER): Payer: 59 | Admitting: Genetic Counselor

## 2014-07-17 ENCOUNTER — Encounter (HOSPITAL_BASED_OUTPATIENT_CLINIC_OR_DEPARTMENT_OTHER): Payer: Self-pay | Admitting: General Surgery

## 2014-07-17 ENCOUNTER — Ambulatory Visit (HOSPITAL_BASED_OUTPATIENT_CLINIC_OR_DEPARTMENT_OTHER): Payer: 59 | Admitting: Anesthesiology

## 2014-07-17 ENCOUNTER — Encounter (HOSPITAL_BASED_OUTPATIENT_CLINIC_OR_DEPARTMENT_OTHER)
Admission: RE | Disposition: A | Discharge status: Home / Self Care | Payer: Self-pay | Source: Ambulatory Visit | Attending: General Surgery

## 2014-07-17 ENCOUNTER — Encounter: Payer: Self-pay | Admitting: Genetic Counselor

## 2014-07-17 ENCOUNTER — Ambulatory Visit (HOSPITAL_COMMUNITY)
Admission: RE | Admit: 2014-07-17 | Discharge: 2014-07-17 | Disposition: A | Discharge status: Home / Self Care | Payer: 59 | Source: Ambulatory Visit | Attending: General Surgery | Admitting: General Surgery

## 2014-07-17 ENCOUNTER — Ambulatory Visit (HOSPITAL_BASED_OUTPATIENT_CLINIC_OR_DEPARTMENT_OTHER)
Admission: RE | Admit: 2014-07-17 | Discharge: 2014-07-17 | Disposition: A | Discharge status: Home / Self Care | Payer: 59 | Source: Ambulatory Visit | Attending: General Surgery | Admitting: General Surgery

## 2014-07-17 DIAGNOSIS — Z8489 Family history of other specified conditions: Secondary | ICD-10-CM

## 2014-07-17 DIAGNOSIS — Z800 Family history of malignant neoplasm of digestive organs: Secondary | ICD-10-CM

## 2014-07-17 DIAGNOSIS — M1389 Other specified arthritis, multiple sites: Secondary | ICD-10-CM | POA: Diagnosis not present

## 2014-07-17 DIAGNOSIS — Z85841 Personal history of malignant neoplasm of brain: Secondary | ICD-10-CM | POA: Diagnosis not present

## 2014-07-17 DIAGNOSIS — Z85028 Personal history of other malignant neoplasm of stomach: Secondary | ICD-10-CM | POA: Diagnosis not present

## 2014-07-17 DIAGNOSIS — K219 Gastro-esophageal reflux disease without esophagitis: Secondary | ICD-10-CM | POA: Diagnosis not present

## 2014-07-17 DIAGNOSIS — C50911 Malignant neoplasm of unspecified site of right female breast: Secondary | ICD-10-CM

## 2014-07-17 DIAGNOSIS — Z315 Encounter for genetic counseling: Secondary | ICD-10-CM

## 2014-07-17 DIAGNOSIS — Z880 Allergy status to penicillin: Secondary | ICD-10-CM | POA: Insufficient documentation

## 2014-07-17 DIAGNOSIS — C50411 Malignant neoplasm of upper-outer quadrant of right female breast: Secondary | ICD-10-CM

## 2014-07-17 DIAGNOSIS — Z853 Personal history of malignant neoplasm of breast: Secondary | ICD-10-CM

## 2014-07-17 DIAGNOSIS — Z888 Allergy status to other drugs, medicaments and biological substances status: Secondary | ICD-10-CM | POA: Insufficient documentation

## 2014-07-17 DIAGNOSIS — Z882 Allergy status to sulfonamides status: Secondary | ICD-10-CM | POA: Diagnosis not present

## 2014-07-17 DIAGNOSIS — Z85828 Personal history of other malignant neoplasm of skin: Secondary | ICD-10-CM

## 2014-07-17 DIAGNOSIS — Z856 Personal history of leukemia: Secondary | ICD-10-CM

## 2014-07-17 DIAGNOSIS — Z803 Family history of malignant neoplasm of breast: Secondary | ICD-10-CM

## 2014-07-17 DIAGNOSIS — Z806 Family history of leukemia: Secondary | ICD-10-CM

## 2014-07-17 DIAGNOSIS — Z801 Family history of malignant neoplasm of trachea, bronchus and lung: Secondary | ICD-10-CM

## 2014-07-17 DIAGNOSIS — Z885 Allergy status to narcotic agent status: Secondary | ICD-10-CM | POA: Insufficient documentation

## 2014-07-17 HISTORY — DX: Other specified postprocedural states: Z98.890

## 2014-07-17 HISTORY — DX: Nausea with vomiting, unspecified: R11.2

## 2014-07-17 HISTORY — PX: RADIOACTIVE SEED GUIDED PARTIAL MASTECTOMY WITH AXILLARY SENTINEL LYMPH NODE BIOPSY: SHX6520

## 2014-07-17 SURGERY — RADIOACTIVE SEED GUIDED PARTIAL MASTECTOMY WITH AXILLARY SENTINEL LYMPH NODE BIOPSY
Anesthesia: General | Site: Breast | Laterality: Right

## 2014-07-17 MED ORDER — CIPROFLOXACIN IN D5W 400 MG/200ML IV SOLN
INTRAVENOUS | Status: AC
  Filled 2014-07-17: qty 200

## 2014-07-17 MED ORDER — PROMETHAZINE HCL 25 MG/ML IJ SOLN
6.2500 mg | Freq: Once | INTRAMUSCULAR | Status: AC
  Administered 2014-07-17: 6.25 mg via INTRAVENOUS

## 2014-07-17 MED ORDER — METHYLENE BLUE 1 % INJ SOLN
INTRAMUSCULAR | Status: AC
  Filled 2014-07-17: qty 10

## 2014-07-17 MED ORDER — LIDOCAINE HCL (CARDIAC) 20 MG/ML IV SOLN
INTRAVENOUS | Status: DC | PRN
  Administered 2014-07-17: 60 mg via INTRAVENOUS

## 2014-07-17 MED ORDER — FENTANYL CITRATE (PF) 100 MCG/2ML IJ SOLN
INTRAMUSCULAR | Status: AC
  Filled 2014-07-17: qty 2

## 2014-07-17 MED ORDER — DEXAMETHASONE SODIUM PHOSPHATE 4 MG/ML IJ SOLN
INTRAMUSCULAR | Status: DC | PRN
  Administered 2014-07-17: 10 mg via INTRAVENOUS

## 2014-07-17 MED ORDER — OXYCODONE HCL 5 MG PO TABS
ORAL_TABLET | ORAL | Status: AC
  Filled 2014-07-17: qty 1

## 2014-07-17 MED ORDER — MIDAZOLAM HCL 2 MG/2ML IJ SOLN
INTRAMUSCULAR | Status: AC
  Filled 2014-07-17: qty 2

## 2014-07-17 MED ORDER — MEPERIDINE HCL 25 MG/ML IJ SOLN: 6.2500 mg | INTRAMUSCULAR | Status: DC | PRN

## 2014-07-17 MED ORDER — SCOPOLAMINE 1 MG/3DAYS TD PT72
1.0000 | MEDICATED_PATCH | Freq: Once | TRANSDERMAL | Status: AC | PRN
  Administered 2014-07-17: 1.5 mg via TRANSDERMAL
  Administered 2014-07-17: 1 via TRANSDERMAL

## 2014-07-17 MED ORDER — OXYCODONE HCL 5 MG PO TABS
5.0000 mg | ORAL_TABLET | Freq: Once | ORAL | Status: AC | PRN
  Administered 2014-07-17: 5 mg via ORAL

## 2014-07-17 MED ORDER — BUPIVACAINE-EPINEPHRINE (PF) 0.5% -1:200000 IJ SOLN
INTRAMUSCULAR | Status: DC | PRN
  Administered 2014-07-17: 25 mL via PERINEURAL

## 2014-07-17 MED ORDER — HYDROMORPHONE HCL 1 MG/ML IJ SOLN
INTRAMUSCULAR | Status: AC
  Filled 2014-07-17: qty 1

## 2014-07-17 MED ORDER — PROPOFOL 10 MG/ML IV BOLUS
INTRAVENOUS | Status: DC | PRN
  Administered 2014-07-17: 200 mg via INTRAVENOUS

## 2014-07-17 MED ORDER — ONDANSETRON HCL 4 MG/2ML IJ SOLN
INTRAMUSCULAR | Status: DC | PRN
  Administered 2014-07-17: 4 mg via INTRAVENOUS

## 2014-07-17 MED ORDER — OXYCODONE HCL 5 MG/5ML PO SOLN: 5.0000 mg | Freq: Once | ORAL | Status: AC | PRN

## 2014-07-17 MED ORDER — PROMETHAZINE HCL 25 MG RE SUPP: 25.0000 mg | Freq: Four times a day (QID) | RECTAL | Status: DC | PRN

## 2014-07-17 MED ORDER — TECHNETIUM TC 99M SULFUR COLLOID FILTERED
1.0000 | Freq: Once | INTRAVENOUS | Status: AC | PRN
  Administered 2014-07-17: 1 via INTRADERMAL

## 2014-07-17 MED ORDER — CIPROFLOXACIN IN D5W 400 MG/200ML IV SOLN
400.0000 mg | INTRAVENOUS | Status: AC
  Administered 2014-07-17: 400 mg via INTRAVENOUS

## 2014-07-17 MED ORDER — FENTANYL CITRATE (PF) 100 MCG/2ML IJ SOLN
INTRAMUSCULAR | Status: AC
  Filled 2014-07-17: qty 6

## 2014-07-17 MED ORDER — LIDOCAINE-EPINEPHRINE (PF) 1 %-1:200000 IJ SOLN
INTRAMUSCULAR | Status: DC | PRN
  Administered 2014-07-17: 20 mL

## 2014-07-17 MED ORDER — HYDROMORPHONE HCL 1 MG/ML IJ SOLN
0.2500 mg | INTRAMUSCULAR | Status: DC | PRN
  Administered 2014-07-17 (×2): 0.25 mg via INTRAVENOUS

## 2014-07-17 MED ORDER — SCOPOLAMINE 1 MG/3DAYS TD PT72
MEDICATED_PATCH | TRANSDERMAL | Status: AC
  Filled 2014-07-17: qty 1

## 2014-07-17 MED ORDER — PROMETHAZINE HCL 25 MG/ML IJ SOLN
INTRAMUSCULAR | Status: AC
  Filled 2014-07-17: qty 1

## 2014-07-17 MED ORDER — GLYCOPYRROLATE 0.2 MG/ML IJ SOLN: 0.2000 mg | Freq: Once | INTRAMUSCULAR | Status: DC | PRN

## 2014-07-17 MED ORDER — PROMETHAZINE HCL 25 MG RE SUPP
RECTAL | Status: AC
  Filled 2014-07-17: qty 1

## 2014-07-17 MED ORDER — OXYCODONE-ACETAMINOPHEN 5-325 MG PO TABS: 1.0000 | ORAL_TABLET | ORAL | Status: DC | PRN

## 2014-07-17 MED ORDER — MIDAZOLAM HCL 2 MG/2ML IJ SOLN
1.0000 mg | INTRAMUSCULAR | Status: DC | PRN
  Administered 2014-07-17: 2 mg via INTRAVENOUS
  Administered 2014-07-17: 1 mg via INTRAVENOUS

## 2014-07-17 MED ORDER — FENTANYL CITRATE (PF) 100 MCG/2ML IJ SOLN
50.0000 ug | INTRAMUSCULAR | Status: AC | PRN
  Administered 2014-07-17 (×3): 50 ug via INTRAVENOUS
  Administered 2014-07-17: 100 ug via INTRAVENOUS
  Administered 2014-07-17: 25 ug via INTRAVENOUS
  Administered 2014-07-17: 50 ug via INTRAVENOUS
  Administered 2014-07-17 (×5): 25 ug via INTRAVENOUS

## 2014-07-17 MED ORDER — LACTATED RINGERS IV SOLN
INTRAVENOUS | Status: DC
  Administered 2014-07-17 (×2): via INTRAVENOUS

## 2014-07-17 SURGICAL SUPPLY — 75 items
APPLIER CLIP 9.375 MED OPEN (MISCELLANEOUS)
BINDER BREAST LRG (GAUZE/BANDAGES/DRESSINGS) IMPLANT
BINDER BREAST MEDIUM (GAUZE/BANDAGES/DRESSINGS) IMPLANT
BINDER BREAST XLRG (GAUZE/BANDAGES/DRESSINGS) IMPLANT
BINDER BREAST XXLRG (GAUZE/BANDAGES/DRESSINGS) ×3 IMPLANT
BLADE HEX COATED 2.75 (ELECTRODE) ×3 IMPLANT
BLADE SURG 10 STRL SS (BLADE) ×3 IMPLANT
BLADE SURG 15 STRL LF DISP TIS (BLADE) ×1 IMPLANT
BLADE SURG 15 STRL SS (BLADE) ×2
BNDG COHESIVE 4X5 TAN STRL (GAUZE/BANDAGES/DRESSINGS) ×3 IMPLANT
CANISTER SUC SOCK COL 7IN (MISCELLANEOUS) IMPLANT
CANISTER SUCT 1200ML W/VALVE (MISCELLANEOUS) ×3 IMPLANT
CHLORAPREP W/TINT 26ML (MISCELLANEOUS) ×3 IMPLANT
CLIP APPLIE 9.375 MED OPEN (MISCELLANEOUS) IMPLANT
CLIP TI LARGE 6 (CLIP) ×3 IMPLANT
CLIP TI MEDIUM 6 (CLIP) ×9 IMPLANT
CLIP TI WIDE RED SMALL 6 (CLIP) IMPLANT
CLOSURE WOUND 1/2 X4 (GAUZE/BANDAGES/DRESSINGS) ×1
COVER MAYO STAND STRL (DRAPES) ×3 IMPLANT
COVER PROBE W GEL 5X96 (DRAPES) ×3 IMPLANT
DECANTER SPIKE VIAL GLASS SM (MISCELLANEOUS) IMPLANT
DERMABOND ADVANCED (GAUZE/BANDAGES/DRESSINGS) ×2
DERMABOND ADVANCED .7 DNX12 (GAUZE/BANDAGES/DRESSINGS) ×1 IMPLANT
DEVICE DUBIN W/COMP PLATE 8390 (MISCELLANEOUS) ×3 IMPLANT
DRAPE UTILITY XL STRL (DRAPES) ×6 IMPLANT
DRSG PAD ABDOMINAL 8X10 ST (GAUZE/BANDAGES/DRESSINGS) IMPLANT
ELECT REM PT RETURN 9FT ADLT (ELECTROSURGICAL) ×3
ELECTRODE REM PT RTRN 9FT ADLT (ELECTROSURGICAL) ×1 IMPLANT
GLOVE BIO SURGEON STRL SZ 6 (GLOVE) ×6 IMPLANT
GLOVE BIO SURGEON STRL SZ 6.5 (GLOVE) ×2 IMPLANT
GLOVE BIO SURGEONS STRL SZ 6.5 (GLOVE) ×1
GLOVE BIOGEL PI IND STRL 6.5 (GLOVE) ×1 IMPLANT
GLOVE BIOGEL PI IND STRL 7.5 (GLOVE) ×1 IMPLANT
GLOVE BIOGEL PI IND STRL 8 (GLOVE) ×1 IMPLANT
GLOVE BIOGEL PI INDICATOR 6.5 (GLOVE) ×2
GLOVE BIOGEL PI INDICATOR 7.5 (GLOVE) ×2
GLOVE BIOGEL PI INDICATOR 8 (GLOVE) ×2
GLOVE ECLIPSE 6.5 STRL STRAW (GLOVE) ×3 IMPLANT
GLOVE SURG SS PI 7.5 STRL IVOR (GLOVE) ×3 IMPLANT
GOWN STRL REUS W/ TWL LRG LVL3 (GOWN DISPOSABLE) ×2 IMPLANT
GOWN STRL REUS W/ TWL XL LVL3 (GOWN DISPOSABLE) ×1 IMPLANT
GOWN STRL REUS W/TWL 2XL LVL3 (GOWN DISPOSABLE) ×3 IMPLANT
GOWN STRL REUS W/TWL LRG LVL3 (GOWN DISPOSABLE) ×4
GOWN STRL REUS W/TWL XL LVL3 (GOWN DISPOSABLE) ×2
KIT MARKER MARGIN INK (KITS) ×3 IMPLANT
NDL SAFETY ECLIPSE 18X1.5 (NEEDLE) IMPLANT
NEEDLE HYPO 18GX1.5 SHARP (NEEDLE)
NEEDLE HYPO 25X1 1.5 SAFETY (NEEDLE) ×3 IMPLANT
NS IRRIG 1000ML POUR BTL (IV SOLUTION) ×3 IMPLANT
PACK BASIN DAY SURGERY FS (CUSTOM PROCEDURE TRAY) ×3 IMPLANT
PACK UNIVERSAL I (CUSTOM PROCEDURE TRAY) ×3 IMPLANT
PENCIL BUTTON HOLSTER BLD 10FT (ELECTRODE) ×3 IMPLANT
SLEEVE SCD COMPRESS KNEE MED (MISCELLANEOUS) ×3 IMPLANT
SPONGE GAUZE 4X4 12PLY STER LF (GAUZE/BANDAGES/DRESSINGS) ×3 IMPLANT
SPONGE LAP 18X18 X RAY DECT (DISPOSABLE) ×6 IMPLANT
STAPLER VISISTAT 35W (STAPLE) IMPLANT
STOCKINETTE IMPERVIOUS LG (DRAPES) ×3 IMPLANT
STRIP CLOSURE SKIN 1/2X4 (GAUZE/BANDAGES/DRESSINGS) ×2 IMPLANT
SUT ETHILON 2 0 FS 18 (SUTURE) IMPLANT
SUT MNCRL AB 4-0 PS2 18 (SUTURE) ×6 IMPLANT
SUT MON AB 5-0 PS2 18 (SUTURE) IMPLANT
SUT SILK 2 0 SH (SUTURE) IMPLANT
SUT VIC AB 2-0 SH 27 (SUTURE) ×4
SUT VIC AB 2-0 SH 27XBRD (SUTURE) ×2 IMPLANT
SUT VIC AB 3-0 SH 27 (SUTURE) ×4
SUT VIC AB 3-0 SH 27X BRD (SUTURE) ×2 IMPLANT
SUT VIC AB 5-0 PS2 18 (SUTURE) IMPLANT
SUT VICRYL AB 2 0 TIE (SUTURE) ×1 IMPLANT
SUT VICRYL AB 2 0 TIES (SUTURE) ×2
SYR CONTROL 10ML LL (SYRINGE) ×3 IMPLANT
TOWEL OR 17X24 6PK STRL BLUE (TOWEL DISPOSABLE) ×3 IMPLANT
TOWEL OR NON WOVEN STRL DISP B (DISPOSABLE) ×3 IMPLANT
TUBE CONNECTING 20'X1/4 (TUBING) ×1
TUBE CONNECTING 20X1/4 (TUBING) ×2 IMPLANT
YANKAUER SUCT BULB TIP NO VENT (SUCTIONS) ×3 IMPLANT

## 2014-07-17 NOTE — Transfer of Care (Signed)
Immediate Anesthesia Transfer of Care Note  Patient: Marie Pierce  Procedure(s) Performed: Procedure(s): RADIOACTIVE SEED GUIDED PARTIAL MASTECTOMY WITH AXILLARY SENTINEL LYMPH NODE BIOPSY (Right)  Patient Location: PACU  Anesthesia Type:General  Level of Consciousness: awake, alert  and oriented  Airway & Oxygen Therapy: Patient Spontanous Breathing and Patient connected to face mask oxygen  Post-op Assessment: Report given to RN and Post -op Vital signs reviewed and stable  Post vital signs: Reviewed and stable  Last Vitals:  Filed Vitals:   07/17/14 1436  BP:   Pulse: 100  Temp:   Resp: 19    Complications: No apparent anesthesia complications

## 2014-07-17 NOTE — Anesthesia Preprocedure Evaluation (Signed)
Anesthesia Evaluation  Patient identified by MRN, date of birth, ID band Patient awake    Reviewed: Allergy & Precautions, NPO status , Patient's Chart, lab work & pertinent test results  History of Anesthesia Complications (+) PONV  Airway Mallampati: I  TM Distance: >3 FB Neck ROM: Full    Dental  (+) Teeth Intact, Dental Advisory Given   Pulmonary  breath sounds clear to auscultation        Cardiovascular Rhythm:Regular Rate:Normal     Neuro/Psych    GI/Hepatic GERD-  Medicated and Controlled,  Endo/Other    Renal/GU      Musculoskeletal   Abdominal   Peds  Hematology   Anesthesia Other Findings   Reproductive/Obstetrics                             Anesthesia Physical Anesthesia Plan  ASA: II  Anesthesia Plan: General   Post-op Pain Management:    Induction: Intravenous  Airway Management Planned: LMA  Additional Equipment:   Intra-op Plan:   Post-operative Plan: Extubation in OR  Informed Consent: I have reviewed the patients History and Physical, chart, labs and discussed the procedure including the risks, benefits and alternatives for the proposed anesthesia with the patient or authorized representative who has indicated his/her understanding and acceptance.   Dental advisory given  Plan Discussed with: CRNA, Anesthesiologist and Surgeon  Anesthesia Plan Comments:         Anesthesia Quick Evaluation

## 2014-07-17 NOTE — Progress Notes (Signed)
Assisted Dr. Crews with right, ultrasound guided, pectoralis block. Side rails up, monitors on throughout procedure. See vital signs in flow sheet. Tolerated Procedure well. 

## 2014-07-17 NOTE — Anesthesia Postprocedure Evaluation (Signed)
  Anesthesia Post-op Note  Patient: Marie Pierce  Procedure(s) Performed: Procedure(s): RADIOACTIVE SEED GUIDED PARTIAL MASTECTOMY WITH AXILLARY SENTINEL LYMPH NODE BIOPSY (Right)  Patient Location: PACU  Anesthesia Type: General   Level of Consciousness: awake, alert  and oriented  Airway and Oxygen Therapy: Patient Spontanous Breathing  Post-op Pain: mild  Post-op Assessment: Post-op Vital signs reviewed  Post-op Vital Signs: Reviewed  Last Vitals:  Filed Vitals:   07/17/14 1730  BP: 155/65  Pulse: 97  Temp:   Resp: 19    Complications: No apparent anesthesia complications

## 2014-07-17 NOTE — Anesthesia Procedure Notes (Addendum)
Anesthesia Regional Block:  Pectoralis block  Pre-Anesthetic Checklist: ,, timeout performed, Correct Patient, Correct Site, Correct Laterality, Correct Procedure, Correct Position, site marked, Risks and benefits discussed,  Surgical consent,  Pre-op evaluation,  At surgeon's request and post-op pain management  Laterality: Right and Upper  Prep: chloraprep       Needles:  Injection technique: Single-shot  Needle Type: Echogenic Needle     Needle Length: 9cm 9 cm Needle Gauge: 21 and 21 G    Additional Needles:  Procedures: ultrasound guided (picture in chart) Pectoralis block Narrative:  Start time: 07/17/2014 2:30 PM End time: 07/17/2014 2:36 PM Injection made incrementally with aspirations every 5 mL.  Performed by: Personally  Anesthesiologist: CREWS, DAVID   Procedure Name: LMA Insertion Date/Time: 07/17/2014 2:50 PM Performed by: Maryella Shivers Pre-anesthesia Checklist: Patient identified, Emergency Drugs available, Suction available and Patient being monitored Patient Re-evaluated:Patient Re-evaluated prior to inductionOxygen Delivery Method: Circle System Utilized Preoxygenation: Pre-oxygenation with 100% oxygen Intubation Type: IV induction Ventilation: Mask ventilation without difficulty LMA: LMA inserted LMA Size: 4.0 Number of attempts: 1 Airway Equipment and Method: Bite block Placement Confirmation: positive ETCO2 Tube secured with: Tape Dental Injury: Teeth and Oropharynx as per pre-operative assessment

## 2014-07-17 NOTE — Op Note (Signed)
Right Breast Radioactive seed localized lumpectomy and sentinel lymph node biopsy  Indications: This patient presents with history of right breast cancer, upper outer quadrant  Pre-operative Diagnosis: [id number] right breast cancer   Post-operative Diagnosis: Same  Surgeon: [name]   Anesthesia: General endotracheal anesthesia  ASA Class: 2  Procedure Details  The patient was seen in the Holding Room. The risks, benefits, complications, treatment options, and expected outcomes were discussed with the patient. The possibilities of bleeding, infection, the need for additional procedures, failure to diagnose a condition, and creating a complication requiring transfusion or operation were discussed with the patient. The patient concurred with the proposed plan, giving informed consent.  The site of surgery properly noted/marked. The patient was taken to Operating Room # 2, identified, and the procedure verified as Right Breast seed localized Lumpectomy and sentinel node biopsy. A Time Out was held and the above information confirmed.  The right arm, breast, and chest were prepped and draped in standard fashion. The lumpectomy was performed by creating an transverse incision over the upper outer quadrant of the breast over the previously placed radioactive seed.  Dissection was carried down to around the point of maximum signal intensity. The cautery was used to perform the dissection.  Hemostasis was achieved with cautery. The edges of the cavity were marked with large clips, with one each medial, lateral, inferior and superior, and two clips posteriorly.   The specimen was inked with the margin marker paint kit.    Specimen radiography confirmed inclusion of the mammographic lesion, the clip, and the seed.  The background signal in the breast was zero.  The wound was irrigated and closed with 3-0 vicryl in layers and 4-0 monocryl subcuticular suture.    Using a hand-held gamma probe, right axillary  sentinel nodes were identified transcutaneously.  An oblique incision was created below the axillary hairline.  Dissection was carried through the clavipectoral fascia.  Four level 2 axillary sentinel nodes were removed.  Counts per second were 590, 110, 50, and 75.    The background count was 12 cps.  The wound was irrigated.  Hemostasis was achieved with cautery.  The axillary incision was closed with a 3-0 vicryl deep dermal interrupted sutures and a 4-0 monocryl subcuticular closure.    Sterile dressings were applied. At the end of the operation, all sponge, instrument, and needle counts were correct.  Findings: grossly clear surgical margins and no adenopathy.  Posterior margin is pectoralis and anterior margin is skin.    Estimated Blood Loss:  min         Specimens: right breast lumpectomy and four right axillary sentinel lymph nodes.             Complications:  None; patient tolerated the procedure well.         Disposition: PACU - hemodynamically stable.         Condition: stable

## 2014-07-17 NOTE — Progress Notes (Signed)
REFERRING PROVIDER: Lurline Del, MD  ADDITIONAL PROVIDERS:  [name], MD Arloa Koh, MD  PRIMARY REASON FOR VISIT:  1. Breast cancer, right   2. Family history of breast cancer in female   3. Family history of colon cancer   4. Family history of stomach cancer   5. Family history of brain tumor   6. Family history of lung cancer   7. Family history of leukemia      HISTORY OF PRESENT ILLNESS:   Marie Pierce, a 59 y.o. female, was seen for a [place] cancer genetics consultation at the request of Dr. Jana Hakim due to a personal and family history of cancer.  Marie Pierce presents to clinic today to discuss the possibility of a hereditary predisposition to cancer, genetic testing, and to further clarify her future cancer risks, as well as potential cancer risks for family members.   In June 2016, at the age of 70, Marie Pierce was diagnosed with invasive ductal carcinoma of the right breast, along with right breast calcifications and atypical lobular hyperplasia. The hormone status was ER/PR+, Her2-.  This was treated with partial right mastectomy on 07/17/14 and will be followed by radiation.   CANCER HISTORY:    Breast cancer of upper-outer quadrant of right female breast   06/25/2014 Breast US subtle irregular hypoechoic mass located within the right breast at the 11 o'clock position 9 cm from the nipple with shadowing. This measures 6 x 5 x 5 mm in size.    07/03/2014 Initial Biopsy Breast, right, needle core biopsy, UOQ - INVASIVE DUCTAL CARCINOMA, SEE COMMENT. - LOBULAR NEOPLASIA (ATYPICAL LOBULAR HYPERPLASIA). - CALCIFICATIONS PRESENT.   07/03/2014 Receptors her2 Estrogen Receptor: 100%, POSITIVE, STRONG STAINING INTENSITY (PERFORMED MANUALLY) Progesterone Receptor: 20%, POSITIVE, STRONG STAINING INTENSITY (PERFORMED MANUALLY) Proliferation Marker Ki67: 10% (PERFORMED MANUALLY    07/06/2014 Initial Diagnosis Breast cancer of upper-outer quadrant of right female breast      HORMONAL RISK FACTORS:  Menarche was at age 53.  First live birth at age 73.  OCP use for approximately 10 years.  Ovaries intact: no.  Hysterectomy: yes.  Menopausal status: postmenopausal.  HRT use: 0 years. Colonoscopy: yes; colon polyp found in 2000, 1 tubular adenoma; normal results in 2011, but due for one this year. Mammogram within the last year: yes. Number of breast biopsies: 3. Up to date with pelvic exams:  yes. 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 Elevated blood pressure reading without diagnosis of hypertension     pt denies hypertension  . GERD (gastroesophageal reflux disease)   . Gastric ulcer   . Breast cancer of upper-outer quadrant of right female breast 07/06/2014  . Breast cancer   . Arthritis     hands and feet  . PONV (postoperative nausea and vomiting)     nausea only    Past Surgical History  Procedure Laterality Date  . Abdominal hysterectomy    . Cesarean section    . Hemorrhoid surgery    . Foot surgery      Left  . Tonsillectomy    . Breast biopsy      left x2; right x 1  . Colonoscopy    . Polypectomy      History   Social History  . Marital Status: Married    Spouse Name: N/A  . Number of Children: N/A  . Years  of Education: N/A   Occupational History  . [employer]     OR Nurse    Social History Main Topics  . Smoking status: Never Smoker   . Smokeless tobacco: Never Used  . Alcohol Use: 0.6 - 1.8 oz/week    1-3 Glasses of wine per week     Comment: occ  . Drug Use: No  . Sexual Activity: Yes    Birth Control/ Protection: Surgical   Other Topics Concern  . None   Social History Narrative     FAMILY HISTORY:  We obtained a detailed, 4-generation family history.  Significant diagnoses are listed below: Family History  Problem Relation Age of Onset  . Colon cancer Father 50    and dx.  again at 92  . Stomach cancer Maternal Aunt 60  . Diabetes Mother   . Heart disease Mother   . Breast cancer Paternal Aunt     dx. 5s  . Breast cancer Maternal Grandmother 34  . Skin cancer Brother     not melanoma  . Colon polyps Brother     1 polyp on colonoscopy  . Breast cancer Maternal Aunt 52  . Lung cancer Maternal Uncle     smoker  . Lung cancer Maternal Uncle     smoker  . Brain cancer Maternal Uncle     tumor type unknown  . Breast cancer Cousin     underwent BL mastectomies  . Leukemia Cousin 44    Marie Pierce has one son, age 56, and one daughter, age 12.  She has two brothers, ages 19 and 67.  One brother has a history of non-melanoma skin cancers.  Marie Pierce mother is alive and cancer-free at 68.  Marie Pierce father is currently 26 and has a history of colon cancer diagnosed at 33 and again at 57.  There is a paternal family history of breast cancer in a paternal aunt diagnosed in her 44s.  One daughter of this aunt was diagnosed with breast cancer and has had bilateral mastectomies.  Marie Pierce paternal grandparents passed away due to heart problems in their 42s or 64s.    Marie Pierce has a maternal family history of breast cancer diagnosed in an aunt at the age of 100.  Another maternal aunt was diagnosed with stomach cancer in her 63s.  Five maternal aunts are alive and cancer-free in their 35s and 62s.  Two maternal uncles were diagnosed with lung cancer in their 13s and were smokers.  One maternal uncle passed away at 53 due to brain tumor (type unknown).  A fourth maternal uncle passed away in childhood.  One maternal first cousin was diagnosed with leukemia at the age of 66.  Marie Pierce maternal grandmother was diagnosed with breast cancer at 41.  Marie Pierce maternal grandfather passed away around 14 and was cancer-free.    Patient's maternal ancestors are of Caucasian/Native American ([place]rokee) descent, and paternal ancestors are of Caucasian/German  descent. There is no reported Ashkenazi Jewish ancestry. There is no known consanguinity.  GENETIC COUNSELING ASSESSMENT: Marie Pierce is a 59 y.o. female with a personal and family history of cancer which somewhat suggestive of a hereditary cancer syndrome and predisposition to cancer. We, therefore, discussed and recommended the following at today's visit.   DISCUSSION: We reviewed the characteristics, features and inheritance patterns of hereditary cancer syndromes, specifically those caused by changes within the BRCA1/2 and Lynch syndrome genes. We also discussed genetic testing, including  the appropriate family members to test, the process of testing, insurance coverage and turn-around-time for results. We discussed the implications of a negative, positive and/or variant of uncertain significant result. We recommended Marie Pierce pursue genetic testing for the 24-gene OvaNext panel through [facility].   Based on Marie Pierce's personal and family history of cancer, she meets medical criteria for genetic testing. Despite that she meets criteria, she may still have an out of pocket cost. We discussed that if her out of pocket cost for testing is over $100, the laboratory will call and confirm whether she wants to proceed with testing.  If the out of pocket cost of testing is less than $100 she will be billed by the genetic testing laboratory.   PLAN: After considering the risks, benefits, and limitations, Marie Pierce  provided informed consent to pursue genetic testing and the blood sample was sent to [facility] for analysis of the 24-gene OvaNext Panel test. The 24-gene OvaNext Panel includes sequencing and deletion/duplication analysis of the following 23 genes: ATM, BARD1, BRCA1, BRCA2, BRIP1, CDH1, CHEK2, MLH1, MRE11A, MSH2, MSH6, MUTYH, NBN, NF1, PALB2, PMS2, PTEN, RAD50, RAD51C, RAD51D, SMARCA4, STK11, and TP53.  This panel also includes deletion/duplication  analysis (without sequencing) for one gene, EPCAM.  Results should be available within approximately 3-4 weeks' time, at which point they will be disclosed by telephone to Marie Pierce, as will any additional recommendations warranted by these results. Marie Pierce will receive a summary of her genetic counseling visit and a copy of her results once available. This information will also be available in Epic. We encouraged Marie Pierce to remain in contact with cancer genetics annually so that we can continuously update the family history and inform her of any changes in cancer genetics and testing that may be of benefit for her family. Ms. Brandner questions were answered to her satisfaction today. Our contact information was provided should additional questions or concerns arise.   Thank you for the referral and allowing [place] to share in the care of your patient.   Jeanine Luz, MS Genetic Counselor [email or web address] Phone: [phone]  The patient was seen for a total of 60 minutes in face-to-face genetic counseling.  This patient was discussed with Drs. Magrinat, Lindi Adie and/or Burr Medico who agrees with the above.    _______________________________________________________________________ For Office Staff:  Number of people involved in session: 1 Was an Intern/ student involved with case: no

## 2014-07-17 NOTE — Anesthesia Postprocedure Evaluation (Signed)
Anesthesia H&P Update: History and Physical Exam reviewed; patient is OK for planned anesthetic and procedure. ? ?

## 2014-07-17 NOTE — Discharge Instructions (Addendum)
[facility] °Office Phone Number [phone] ° °BREAST BIOPSY/ PARTIAL MASTECTOMY: POST OP INSTRUCTIONS ° °Always review your discharge instruction sheet given to you by the facility where your surgery was performed. ° °IF YOU HAVE DISABILITY OR FAMILY LEAVE FORMS, YOU MUST BRING THEM TO THE OFFICE FOR PROCESSING.  DO NOT GIVE THEM TO YOUR DOCTOR. ° °1. A prescription for pain medication may be given to you upon discharge.  Take your pain medication as prescribed, if needed.  If narcotic pain medicine is not needed, then you may take acetaminophen (Tylenol) or ibuprofen (Advil) as needed. °2. Take your usually prescribed medications unless otherwise directed °3. If you need a refill on your pain medication, please contact your pharmacy.  They will contact our office to request authorization.  Prescriptions will not be filled after 5pm or on week-ends. °4. You should eat very light the first 24 hours after surgery, such as soup, crackers, pudding, etc.  Resume your normal diet the day after surgery. °5. Most patients will experience some swelling and bruising in the breast.  Ice packs and a good support bra will help.  Swelling and bruising can take several days to resolve.  °6. It is common to experience some constipation if taking pain medication after surgery.  Increasing fluid intake and taking a stool softener will usually help or prevent this problem from occurring.  A mild laxative (Milk of Magnesia or Miralax) should be taken according to package directions if there are no bowel movements after 48 hours. °7. Unless discharge instructions indicate otherwise, you may remove your bandages 48 hours after surgery, and you may shower at that time.  You may have steri-strips (small skin tapes) in place directly over the incision.  These strips should be left on the skin for 7-10 days.   Any sutures or staples will be removed at the office during your follow-up visit. °8. ACTIVITIES:  You may resume  regular daily activities (gradually increasing) beginning the next day.  Wearing a good support bra or sports bra (or the breast binder) minimizes pain and swelling.  You may have sexual intercourse when it is comfortable. °a. You may drive when you no longer are taking prescription pain medication, you can comfortably wear a seatbelt, and you can safely maneuver your car and apply brakes. °b. RETURN TO WORK:  __________1 week_______________ °9. You should see your doctor in the office for a follow-up appointment approximately two weeks after your surgery.  Your doctor’s nurse will typically make your follow-up appointment when she calls you with your pathology report.  Expect your pathology report 2-3 business days after your surgery.  You may call to check if you do not hear from us after three days. ° ° °WHEN TO CALL YOUR DOCTOR: °1. Fever over 101.0 °2. Nausea and/or vomiting. °3. Extreme swelling or bruising. °4. Continued bleeding from incision. °5. Increased pain, redness, or drainage from the incision. ° °The clinic staff is available to answer your questions during regular business hours.  Please don’t hesitate to call and ask to speak to one of the nurses for clinical concerns.  If you have a medical emergency, go to the nearest emergency room or call 911.  A surgeon from [facility] is always on call at the hospital. ° °For further questions, please visit [email or web address]  ° ° °Post Anesthesia Home Care Instructions ° °Activity: °Get plenty of rest for the remainder of the day. A responsible adult should stay with you for 24   hours following the procedure.  °For the next 24 hours, DO NOT: °-Drive a car °-Operate machinery °-Drink alcoholic beverages °-Take any medication unless instructed by your physician °-Make any legal decisions or sign important papers. ° °Meals: °Start with liquid foods such as gelatin or soup. Progress to regular foods as tolerated. Avoid greasy, spicy, heavy  foods. If nausea and/or vomiting occur, drink only clear liquids until the nausea and/or vomiting subsides. Call your physician if vomiting continues. ° °Special Instructions/Symptoms: °Your throat may feel dry or sore from the anesthesia or the breathing tube placed in your throat during surgery. If this causes discomfort, gargle with warm salt water. The discomfort should disappear within 24 hours. ° °If you had a scopolamine patch placed behind your ear for the management of post- operative nausea and/or vomiting: ° °1. The medication in the patch is effective for 72 hours, after which it should be removed.  Wrap patch in a tissue and discard in the trash. Wash hands thoroughly with soap and water. °2. You may remove the patch earlier than 72 hours if you experience unpleasant side effects which may include dry mouth, dizziness or visual disturbances. °3. Avoid touching the patch. Wash your hands with soap and water after contact with the patch. °  ° °

## 2014-07-17 NOTE — H&P (Signed)
Marie Pierce 07/11/2014 6:51 AM Location: [facility] Patient #: [id number] DOB: 05-10-1955 Undefined / Language: Marie Pierce / Race: Undefined Female  History of Present Illness Marie Klein MD; 07/11/2014 12:14 PM) The patient is a 59 year old female who presents with breast cancer. Patient is a 59 yo F referred by Dr. Dr. Phineas Pierce for a new diagnosis of right breast cancer. She presented with a tomographic screening detected architectural distortion. She has a history of 2 excisional breast biopsies in the past. Diagnostic imaging was obtained. This demonstrated a 6 mm area of concern at 11 o'clock. 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 ER and PR were +, and Her-2 neu was pending. Ki67 is 10%. She denies palpable mass. 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  Menarche was at age 78. She had menopause after hysterectomy at age <71. She is a G2P2 with first child between age 76-35. She has a family history of breast cancer in her maternal grandmother at age 5 and a maternal aunt at an unknown age. Her dad had colon cancer Diagnostic Mammogram/ultrasound 6/20 FINDINGS: There is a persistent subtle area of distortion located superiorly within the right breast at approximately the 11 o'clock position. On physical exam, there is no discrete palpable abnormality. Targeted ultrasound is performed, showing a subtle irregular hypoechoic mass located within the right breast at the 11 o'clock position 9 cm from the nipple with shadowing. This measures 6 x 5 x 5 mm in size. Most likely this represents the mammographic finding. Tissue sampling is recommended. However, I believe this would be more accurately sampled by 3D guidance and this will be scheduled as a stereotactic (Affirm) biopsy. This has been discussed with the patient. Ultrasound of the right axilla demonstrates normal axillary contents and no evidence  for adenopathy. IMPRESSION: Subtle small area of distortion located superiorly within the right breast at the 11 o'clock position. Tissue sampling is recommended and stereotactic biopsy will be scheduled. Pathology Breast, right, needle core biopsy, UOQ - INVASIVE DUCTAL CARCINOMA, SEE COMMENT. - LOBULAR NEOPLASIA (ATYPICAL LOBULAR HYPERPLASIA). - CALCIFICATIONS PRESENT.   Other Problems Marie Klein, MD; 07/11/2014 6:58 AM) Breast Cancer Gastroesophageal Reflux Disease  Past Surgical History Marie Klein, MD; 07/11/2014 6:58 AM) Breast Biopsy Left. Cesarean Section - Multiple Hysterectomy (due to cancer) - Complete Tonsillectomy  Diagnostic Studies History Marie Klein, MD; 07/11/2014 6:58 AM) Colonoscopy 1-5 years ago Mammogram within last year Pap Smear 1-5 years ago  Social History Marie Klein, MD; 07/11/2014 6:58 AM) Alcohol use Occasional alcohol use. Caffeine use Carbonated beverages, Coffee, Tea. No drug use Tobacco use Never smoker.  Family History Marie Klein, MD; 07/11/2014 6:58 AM) Cancer Father.  Pregnancy / Birth History Marie Klein, MD; 07/11/2014 6:58 AM) Age at menarche 49 years. Age of menopause <45 Contraceptive History Oral contraceptives. Gravida 2 Irregular periods Maternal age 43-35 Para 2  Review of Systems Marie Klein MD; 07/11/2014 6:59 AM) General Not Present- Appetite Loss, Chills, Fatigue, Fever, Night Sweats, Weight Gain and Weight Loss. Skin Not Present- Change in Wart/Mole, Dryness, Hives, Jaundice, New Lesions, Non-Healing Wounds, Rash and Ulcer. HEENT Not Present- Earache, Hearing Loss, Hoarseness, Nose Bleed, Oral Ulcers, Ringing in the Ears, Seasonal Allergies, Sinus Pain, Sore Throat, Visual Disturbances, Wears glasses/contact lenses and Yellow Eyes. Respiratory Not Present- Bloody sputum, Chronic Cough, Difficulty Breathing, Snoring and Wheezing. Breast Not Present- Breast Mass, Breast Pain, Nipple Discharge and Skin  Changes. Cardiovascular Not Present-  Chest Pain, Difficulty Breathing Lying Down, Leg Cramps, Palpitations, Rapid Heart Rate, Shortness of Breath and Swelling of Extremities. Gastrointestinal Not Present- Abdominal Pain, Bloating, Bloody Stool, Change in Bowel Habits, Chronic diarrhea, Constipation, Difficulty Swallowing, Excessive gas, Gets full quickly at meals, Hemorrhoids, Indigestion, Nausea, Rectal Pain and Vomiting.   Vitals Marie Klein MD; 07/11/2014 12:13 PM) 07/11/2014 12:12 PM Weight: 166.1 lb Height: 62in Body Surface Area: 1.82 m Body Mass Index: 30.38 kg/m Temp.: 98.59F  Pulse: 72 (Regular)  Resp.: 18 (Unlabored)  BP: 147/64 (Sitting, Left Arm, Standard)    Physical Exam Marie Klein MD; 07/11/2014 12:10 PM) General Mental Status-Alert. General Appearance-Consistent with stated age. Hydration-Well hydrated. Voice-Normal.  Head and Neck Head-normocephalic, atraumatic with no lesions or palpable masses. Trachea-midline. Thyroid Gland Characteristics - normal size and consistency.  Eye Eyeball - Bilateral-Extraocular movements intact. Sclera/Conjunctiva - Bilateral-No scleral icterus.  Chest and Lung Exam Chest and lung exam reveals -quiet, even and easy respiratory effort with no use of accessory muscles and on auscultation, normal breath sounds, no adventitious sounds and normal vocal resonance. Inspection Chest Wall - Normal. Back - normal.  Breast Note: Small amount of bruising at biopsy site. No palpable masses. Breasts symmetric bilaterally. No lymphadenopathy. No nipple retraction or skin dimpling.   Cardiovascular Cardiovascular examination reveals -normal heart sounds, regular rate and rhythm with no murmurs and normal pedal pulses bilaterally.  Abdomen Inspection Inspection of the abdomen reveals - No Hernias. Palpation/Percussion Palpation and Percussion of the abdomen reveal - Soft, Non Tender, No Rebound  tenderness, No Rigidity (guarding) and No hepatosplenomegaly. Auscultation Auscultation of the abdomen reveals - Bowel sounds normal.  Neurologic Neurologic evaluation reveals -alert and oriented x 3 with no impairment of recent or remote memory. Mental Status-Normal.  Musculoskeletal Global Assessment -Note: no gross deformities.  Normal Exam - Left-Upper Extremity Strength Normal and Lower Extremity Strength Normal. Normal Exam - Right-Upper Extremity Strength Normal and Lower Extremity Strength Normal.  Lymphatic Head & Neck  General Head & Neck Lymphatics: Bilateral - Description - Normal. Axillary  General Axillary Region: Bilateral - Description - Normal. Tenderness - Non Tender. Femoral & Inguinal  Generalized Femoral & Inguinal Lymphatics: Bilateral - Description - No Generalized lymphadenopathy.    Assessment & Plan Marie Klein MD; 07/11/2014 12:15 PM) PRIMARY CANCER OF UPPER OUTER QUADRANT OF RIGHT FEMALE BREAST (174.4  C50.411) Impression: Patient is a 59 yo F with a cT1bN0 right breast cancer. She is a good candidate for breast conservation. I would plan a seed localized lumpectomy with sentinel lymph node biopsy. She would then get radiation and antihormone therapy. She is a candidate for oncotype as well. She will also be referred to genetics given her family history.  The surgical procedure was described to the patient. I discussed the incision type and location and that we would need radiology involved on with a seed marker and sentinel node.  The risks and benefits of the procedure were described to the patient and she wishes to proceed.  We discussed the risks bleeding, infection, damage to other structures, need for further procedures/surgeries. We discussed the risk of seroma. The patient was advised if the area in the breast in cancer, we may need to go back to surgery for additional tissue to obtain negative margins or for a lymph node biopsy. The  patient was advised that these are the most common complications, but that others can occur as well. They were advised against taking aspirin or other anti-inflammatory agents/blood thinners the week before  surgery.  She will likely need 1-2 weeks off work due to the physical nature of [place].  45 min spent in evaluation, examination, counseling, and coordination of care. >50% spent in counseling. Current Plans  Schedule for Surgery Pt Education - flb breast cancer surgery: discussed with patient and provided information.   Signed by Marie Klein, MD (07/11/2014 12:15 PM)

## 2014-07-17 NOTE — Progress Notes (Signed)
Emotional support during breast injections °

## 2014-07-17 NOTE — Interval H&P Note (Signed)
History and Physical Interval Note:  07/17/2014 2:25 PM  Marie Pierce  has presented today for surgery, with the diagnosis of RIGHT BREAST CANCER  The various methods of treatment have been discussed with the patient and family. After consideration of risks, benefits and other options for treatment, the patient has consented to  Procedure(s): RADIOACTIVE SEED GUIDED PARTIAL MASTECTOMY WITH AXILLARY SENTINEL LYMPH NODE BIOPSY (Right) as a surgical intervention .  The patient's history has been reviewed, patient examined, no change in status, stable for surgery.  I have reviewed the patient's chart and labs.  Questions were answered to the patient's satisfaction.     Kharson Rasmusson

## 2014-07-18 ENCOUNTER — Encounter (HOSPITAL_BASED_OUTPATIENT_CLINIC_OR_DEPARTMENT_OTHER): Payer: Self-pay | Admitting: General Surgery

## 2014-07-19 NOTE — Progress Notes (Signed)
Quick Note:  Please let patient know margins are negative and lymph nodes are negative!  ______

## 2014-07-20 ENCOUNTER — Encounter: Payer: Self-pay | Admitting: *Deleted

## 2014-07-20 NOTE — Progress Notes (Signed)
Received order for oncoytpe testing. Requisition sent to pathology. PAC sent to [facility].

## 2014-07-27 ENCOUNTER — Encounter (HOSPITAL_COMMUNITY): Payer: Self-pay

## 2014-07-30 ENCOUNTER — Telehealth: Payer: Self-pay | Admitting: *Deleted

## 2014-07-30 NOTE — Telephone Encounter (Signed)
Received oncotype score of 17. Per Dr. Jana Hakim pt does not need chemotherapy. Called pt and informed of results. Informed pt she will get scheduled for f/u with Dr. Valere Dross for xrt. Denies further questions or needs at this time. Encourage pt to call with concerns.

## 2014-07-31 ENCOUNTER — Telehealth: Payer: Self-pay | Admitting: *Deleted

## 2014-07-31 NOTE — Telephone Encounter (Signed)
CALLED PATIENT TO INFORM OF APPT. FOR 08-09-14, SPOKE WITH PATIENT AND SHE IS AWARE OF THIS APPT.

## 2014-08-01 ENCOUNTER — Telehealth: Payer: Self-pay | Admitting: *Deleted

## 2014-08-01 NOTE — Telephone Encounter (Signed)
CALLED PATIENT TO INFORM THAT DR. MURRAY WOULD SEE HER THE WEEK OF AUG. 15, SINCE SHE IS GOING ON VACATION, APPT. WILL BE ON AUG. 17-  2:30 PM FOR THE NURSE AND 3 PM FOR THE DR., PATIENT AGREED TO APPT. DAY AND TIME

## 2014-08-03 ENCOUNTER — Telehealth: Payer: Self-pay | Admitting: Genetic Counselor

## 2014-08-03 NOTE — Telephone Encounter (Signed)
Discussed with Marie Pierce that her genetic test results were negative for changes within any of 24-genes that would cause her to be at an increased risk for breast, ovarian, or other related cancers.  We discussed, in a way, this is a less informative result because we still do not have an explanation for the personal and family history of cancer.  However, most cancers are caused by chance.  At this point, the service became very spotty, and we were unable to discuss further.  I asked Marie Pierce to please call me back at a convenient time.  She returned my call while I was out and left me a message.  I then returned her call and left her a message, and tried to get in touch with her one more time.  I will try to call her back at the beginning of next week and she has my office number as well.

## 2014-08-08 ENCOUNTER — Ambulatory Visit: Payer: Self-pay | Admitting: Genetic Counselor

## 2014-08-08 ENCOUNTER — Telehealth: Payer: Self-pay | Admitting: Genetic Counselor

## 2014-08-08 DIAGNOSIS — Z1379 Encounter for other screening for genetic and chromosomal anomalies: Secondary | ICD-10-CM

## 2014-08-08 DIAGNOSIS — C50411 Malignant neoplasm of upper-outer quadrant of right female breast: Secondary | ICD-10-CM

## 2014-08-08 DIAGNOSIS — Z800 Family history of malignant neoplasm of digestive organs: Secondary | ICD-10-CM | POA: Insufficient documentation

## 2014-08-08 DIAGNOSIS — Z803 Family history of malignant neoplasm of breast: Secondary | ICD-10-CM

## 2014-08-08 NOTE — Telephone Encounter (Signed)
Discussed with Marie Pierce that her genetic test results were negative.  At this point, future cancer screening for herself and her family members would be based upon the personal and family history of cancer.  Her daughter should begin mammograms at the age of 44, based on current guidelines and the family history.  We discussed that Marie Pierce and her brothers should continue to get colonoscopies more often based on the family history of colon cancer in their father.  Marie Pierce paternal first cousin who was diagnosed with breast cancer at 37 would be eligible for genetic counseling and testing if interested; Marie Pierce will inform her of this.  Marie Pierce is welcome to call us back in the future to find out if there are any updated testing options and also to update the family history.

## 2014-08-08 NOTE — Progress Notes (Signed)
GENETIC TEST RESULT  HPI: Marie Pierce was previously seen in the [facility] due to a personal and family history of cancer and concerns regarding a hereditary predisposition to cancer. Please refer to our prior cancer genetics clinic note from July 17, 2014 for more information regarding Marie Pierce's medical, social and family histories, and our assessment and recommendations, at the time. Marie Pierce recent genetic test results were disclosed to her, as were recommendations warranted by these results. These results and recommendations are discussed in more detail below.  GENETIC TEST RESULTS: At the time of Marie Pierce's visit on 07/17/14, we recommended she pursue genetic testing of the 24-gene OvaNext Panel through [employer] [facility], [place]).  The OvaNext Panel includes sequencing and deletion/duplication analysis for the following 23 genes: ATM, BARD1, BRCA1, BRCA2, BRIP1, CDH1, CHEK2, MLH1, MRE11A, MSH2, MSH6, MUTYH, NBN, NF1, PALB2, PMS2, PTEN, RAD50, RAD51C, RAD51D, SMARCA4, STK11, and TP53.  This panel also includes deletion/duplication analysis (without sequencing) for one gene, EPCAM.  Those results are now back, the report date for which is August 02, 2014.  Genetic testing was normal, and did not reveal a deleterious mutation in these genes.  Additionally, no variants of uncertain significance were found.  The test report will be scanned into EPIC and will be located under the Media tab.   We discussed with Marie Pierce that since the current genetic testing is not perfect, it is possible there may be a gene mutation in one of these genes that current testing cannot detect, but that chance is small. We also discussed, that it is possible that another gene that has not yet been discovered, or that we have not yet tested, is responsible for the cancer diagnoses in the family, and it is, therefore, important to remain in touch with cancer genetics in the  future so that we can continue to offer Marie Pierce the most up to date genetic testing.      CANCER SCREENING RECOMMENDATIONS: While we still do not have an explanation for the personal and family history of cancer, this result is reassuring and indicates that Marie Pierce likely does not have an increased risk for a future cancer due to a mutation in one of these genes. This normal test also suggests that Marie Pierce's cancer was most likely not due to an inherited predisposition associated with one of these genes.  Most cancers happen by chance and this negative test suggests that her cancer falls into this category.  We, therefore, recommended she continue to follow the cancer management and screening guidelines provided by her oncology and primary healthcare providers.   RECOMMENDATIONS FOR FAMILY MEMBERS: Women in this family might be at some increased risk of developing cancer, over the general population risk, simply due to the family history of cancer. We recommended women in this family have a yearly mammogram beginning at age 44, or 68 years younger than the earliest onset of cancer, an annual clinical breast exam, and perform monthly breast self-exams. This means that, as current guidelines stand, Marie Pierce's daughter should begin breast cancer screening by the age of 31.  Women in this family should also have a gynecological exam as recommended by their primary provider. All family members should have a colonoscopy by age 60, except for first-degree relatives of Marie Pierce's father (i.e. his children, siblings) who should have begun colonoscopies by the age of 40 and should be getting these every 5 years, or more often as indicated  by positive findings.  Marie Pierce reports that she and her brothers have been having colonoscopies since that time.    Furthermore, based on Marie Pierce's family history, we recommended her paternal first cousin, who was diagnosed with breast cancer at age 22, have  genetic counseling and testing.  This cousin is currently in her 62s, and, Marie Pierce, believes has not had genetic testing.  Marie Pierce feels like she might not be interested, but that she will let us know if we can be of any assistance in coordinating genetic counseling and/or testing for this family member.   FOLLOW-UP: Lastly, we discussed with Marie Pierce that cancer genetics is a rapidly advancing field and it is possible that new genetic tests will be appropriate for her and/or her family members in the future. We encouraged her to remain in contact with cancer genetics on an annual basis so we can update her personal and family histories and let her know of advances in cancer genetics that may benefit this family.   Our contact number was provided. Marie Pierce questions were answered to her satisfaction, and she knows she is welcome to call us at anytime with additional questions or concerns.   Jeanine Luz, MS Genetic Counselor [email or web address] Phone: [phone]

## 2014-08-09 ENCOUNTER — Ambulatory Visit: Payer: 59 | Admitting: Radiation Oncology

## 2014-08-09 ENCOUNTER — Ambulatory Visit: Payer: 59

## 2014-08-16 ENCOUNTER — Encounter: Payer: Self-pay | Admitting: Radiation Oncology

## 2014-08-16 NOTE — Progress Notes (Signed)
Location of Breast Cancer: Right Breast, Upper Outer Quadrant  Histology per Pathology Report:  07/17/14 Diagnosis 1. Breast, lumpectomy, right - INVASIVE DUCTAL CARCINOMA WITH CALCIFICATIONS, GRADE 1/3, SPANNING 1.5 CM. - DUCTAL CARCINOMA IN SITU, LOW GRADE. - LOBULAR NEOPLASMA (LOBULAR CARCINOMA IN SITU). - THE SURGICAL RESECTION MARGINS ARE NEGATIVE FOR DUCTAL CARCINOMA. - SEE ONCOLOGY TABLE BELOW. 2. Lymph node, sentinel, biopsy, right #1 - THERE IS NO EVIDENCE OF CARCINOMA IN 1 OF 1 LYMPH NODE (0/1). 3. Lymph node, sentinel, biopsy, right #2 - THERE IS NO EVIDENCE OF CARCINOMA IN 1 OF 1 LYMPH NODE (0/1). 4. Lymph node, sentinel, biopsy, right #3 1 of 3 FINAL for Virnig, Viann M [phone]) Diagnosis(continued) - THERE IS NO EVIDENCE OF CARCINOMA IN 1 OF 1 LYMPH NODE (0/1). 5. Lymph node, sentinel, biopsy, right #4 - THERE IS NO EVIDENCE OF CARCINOMA IN 1 OF 1 LYMPH NODE (0/1).  07/03/14 Diagnosis Breast, right, needle core biopsy, UOQ - INVASIVE DUCTAL CARCINOMA, SEE COMMENT. - LOBULAR NEOPLASIA (ATYPICAL LOBULAR HYPERPLASIA). - CALCIFICATIONS PRESENT.  Receptor Status: ER(100%), PR (20%), Her2-neu (Neg), Ki-67(10%)  Did patient present with symptoms (if so, please note symptoms) or was this found on screening mammography?: Found on 3 D mammogram.  Past/Anticipated interventions by surgeon, if any:Dr. [name], Lumpectomy Right Breast  Past/Anticipated interventions by medical oncology, if any: Dr. Sarajane Jews Magrinat: Antiestrogen therapy to begin at the completion of local treatment  Lymphedema issues, if any: None  Pain issues, if any:  Movement causes "discomfort in the right lateral breast and right posterior back  SAFETY ISSUES:  Prior radiation? No  Pacemaker/ICD? No  Possible current pregnancy?No  Is the patient on methotrexate? No  Current Complaints / other details:   menarche age 12, first live birth at 28, which increases the risk of breast  cancer developing. The patient is G2,P2. She is postmenopausal and did not take hormone replacement. She took oral contraceptives for about ~10 years remotely with no complications.    Deirdre Evener, RN 08/16/2014,6:54 PM

## 2014-08-20 ENCOUNTER — Other Ambulatory Visit: Payer: Self-pay | Admitting: *Deleted

## 2014-08-20 MED ORDER — UNABLE TO FIND: 1.0000 | Status: DC | PRN

## 2014-08-22 ENCOUNTER — Ambulatory Visit
Admission: RE | Admit: 2014-08-22 | Discharge: 2014-08-22 | Disposition: A | Discharge status: Home / Self Care | Payer: 59 | Source: Ambulatory Visit | Attending: Radiation Oncology | Admitting: Radiation Oncology

## 2014-08-22 ENCOUNTER — Encounter: Payer: Self-pay | Admitting: Radiation Oncology

## 2014-08-22 VITALS — BP 144/68 | HR 98 | Temp 98.3°F | Ht 62.0 in | Wt 169.9 lb

## 2014-08-22 DIAGNOSIS — C50911 Malignant neoplasm of unspecified site of right female breast: Secondary | ICD-10-CM | POA: Diagnosis present

## 2014-08-22 DIAGNOSIS — C50411 Malignant neoplasm of upper-outer quadrant of right female breast: Secondary | ICD-10-CM

## 2014-08-22 DIAGNOSIS — Z510 Encounter for antineoplastic radiation therapy: Secondary | ICD-10-CM | POA: Insufficient documentation

## 2014-08-22 DIAGNOSIS — Z170 Estrogen receptor positive status [ER+]: Secondary | ICD-10-CM | POA: Diagnosis not present

## 2014-08-22 NOTE — Progress Notes (Signed)
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 I saw her in consultation on 07/11/2014. At the time of a screening mammogram at the [facility] on 06/18/2014 she was found have distortion within the right breast. Additional views on 06/25/2014 showed distortion localized to 11:00 within the upper-outer quadrant. Ultrasound showed a 0.6 x 0.5 x 0.5 cm mass. The axilla was benign on ultrasound. Ultrasound-guided biopsy on 07/03/2014 was diagnostic for invasive ductal carcinoma with calcifications. This was felt to be grade 1. Her tumor is ER positive at 100%, PR positive at 20% with a low Ki-67 of 10%. HER-2/neu was negative. On 07/17/2014 showed underwent a right partial mastectomy and sentinel lymph node biopsy.  She was found have a 1.5 cm invasive ductal carcinoma along with low-grade DCIS and LCIS. Surgical margins for both invasive and in situ disease was at least 1.0 cm. 4 lymph nodes were free of metastatic disease.  Oncotype DX score was 17 and Dr. Jana Hakim recommends antiestrogen therapy following completion of radiation therapy. She is doing well postoperatively.   Physical examination: alert and oriented. Filed Vitals:   08/22/14 1439  BP: 144/68  Pulse: 98  Temp: 98.3 F (36.8 C)    Head and neck examination: Grossly unremarkable. Nodes: Without palpable cervical, supraclavicular, or axillary lymphadenopathy. Breast: There is a partial mastectomy wound along the upper-outer quadrant of the right breast extending from 11 to 12:00.  The wound is healing well. There is the usual degree of underlying induration. No discreet masses are appreciated. Left breast without masses or lesions. Extremities: Without  edema.   Impression: stage I A (T1 N0 M0) invasive ductal/DCIS/LCIS of the right breast. She is a good candidate for breast conservation.  We discussed hypofractionated  versus standard fractionation. She is somewhat large breasted , we'll make a determination the time of simulation as to whether not she is a candidate for hypofractionation based on her  interfield  tangentdistance /anatomy. We again discussed the potential acute and late toxicities of radiation therapy. Consent is signed today.   Plan: Will have her return in the near future for CT simulation.   30 minutes was spent face-to-face with the patient, primarily counseling patient and coordinating her care.

## 2014-08-22 NOTE — Addendum Note (Signed)
Encounter addended by: Benn Moulder, RN on: 08/22/2014  4:34 PM<BR>     Documentation filed: BPA Follow-up Actions, Flowsheet VN, Dx Association, Orders

## 2014-08-23 ENCOUNTER — Encounter: Payer: Self-pay | Admitting: *Deleted

## 2014-08-23 NOTE — Progress Notes (Signed)
[place] Psychosocial Distress Screening Clinical Social Work  Clinical Social Work was referred by distress screening protocol.  The patient scored a 6 on the Psychosocial Distress Thermometer which indicates moderate distress. Clinical Social Worker phoned pt to assess for distress and other psychosocial needs. Pt was at appointment and her daughter answered phone. Pt aware to return CSW call.   ONCBCN DISTRESS SCREENING 08/22/2014  Screening Type   Distress experienced in past week (1-10) 6  Practical problem type Work/school  Family Problem type   Emotional problem type Adjusting to illness  Information Concerns Type Lack of info about treatment  Physical Problem type Pain  Physician notified of physical symptoms Yes  Referral to clinical psychology Yes  Referral to clinical social work   Referral to dietition   Referral to financial advocate   Referral to support programs   Referral to palliative care     Clinical Social Worker follow up needed: Yes.    If yes, follow up plan: See above Loren Racer, [place] Worker [place]  [facility] Phone: [phone] Fax: [phone]

## 2014-08-28 ENCOUNTER — Ambulatory Visit
Admission: RE | Admit: 2014-08-28 | Discharge: 2014-08-28 | Disposition: A | Discharge status: Home / Self Care | Payer: 59 | Source: Ambulatory Visit | Attending: Radiation Oncology | Admitting: Radiation Oncology

## 2014-08-28 DIAGNOSIS — Z510 Encounter for antineoplastic radiation therapy: Secondary | ICD-10-CM | POA: Diagnosis not present

## 2014-08-28 DIAGNOSIS — C50411 Malignant neoplasm of upper-outer quadrant of right female breast: Secondary | ICD-10-CM

## 2014-08-28 NOTE — Progress Notes (Signed)
Complex simulation/treatment planning note: The patient was taken to the CT simulator.  A Vac lock immobilization device was constructed on a custom breast board.  Her right breast field borders were marked with radiopaque wires.  Her partial mastectomy scar was also marked.  She was then scanned.  I chose an isocenter along the central breast.  The CT data set was sent to the planning system for contouring of her normal anatomy including her tumor bed.  She was set up to medial and lateral right breast tangents.  2 unique MLCs were designed to conform the field.  I prescribing 5040 cGy 28 sessions with 3-D conformal planning.  No boost.

## 2014-08-30 ENCOUNTER — Encounter: Payer: Self-pay | Admitting: Radiation Oncology

## 2014-08-30 DIAGNOSIS — Z510 Encounter for antineoplastic radiation therapy: Secondary | ICD-10-CM | POA: Diagnosis not present

## 2014-08-30 NOTE — Progress Notes (Signed)
3-D simulation note: The patient completed 3-D simulation for treatment of her right breast cancer.  She was set up to medial and lateral right breast tangents with mixed 10 MV/6 MV photons along with electronic compensation.  Unique sets of MLCs are designed along with electronic compensation for a total four complex treatment devices.  Dose volume histograms were obtained for the target structures including the tumor bed and also avoidance structures including the heart and lungs.  We met our departmental guidelines.  I am prescribing 5040 cGy 28 sessions.

## 2014-08-31 DIAGNOSIS — Z510 Encounter for antineoplastic radiation therapy: Secondary | ICD-10-CM | POA: Diagnosis not present

## 2014-09-03 ENCOUNTER — Telehealth: Payer: Self-pay | Admitting: Oncology

## 2014-09-03 ENCOUNTER — Ambulatory Visit (HOSPITAL_BASED_OUTPATIENT_CLINIC_OR_DEPARTMENT_OTHER): Payer: 59 | Admitting: Oncology

## 2014-09-03 ENCOUNTER — Encounter: Payer: Self-pay | Admitting: Internal Medicine

## 2014-09-03 VITALS — BP 139/77 | HR 70 | Temp 98.5°F | Resp 18 | Ht 62.0 in | Wt 169.6 lb

## 2014-09-03 DIAGNOSIS — F329 Major depressive disorder, single episode, unspecified: Secondary | ICD-10-CM

## 2014-09-03 DIAGNOSIS — Z170 Estrogen receptor positive status [ER+]: Secondary | ICD-10-CM

## 2014-09-03 DIAGNOSIS — R21 Rash and other nonspecific skin eruption: Secondary | ICD-10-CM | POA: Diagnosis not present

## 2014-09-03 DIAGNOSIS — C50411 Malignant neoplasm of upper-outer quadrant of right female breast: Secondary | ICD-10-CM | POA: Diagnosis not present

## 2014-09-03 MED ORDER — VENLAFAXINE HCL ER 37.5 MG PO CP24: 37.5000 mg | ORAL_CAPSULE | Freq: Every day | ORAL | Status: DC

## 2014-09-03 MED ORDER — TAMOXIFEN CITRATE 20 MG PO TABS: 20.0000 mg | ORAL_TABLET | Freq: Every day | ORAL | Status: DC

## 2014-09-03 MED ORDER — KETOCONAZOLE 2 % EX CREA: 1.0000 "application " | TOPICAL_CREAM | Freq: Every day | CUTANEOUS | Status: DC

## 2014-09-03 NOTE — Progress Notes (Signed)
[place]  Telephone:([phone]) [phone] Fax:(336) [phone]     ID: Marie Pierce DOB: 1955-07-29  MR#: [id number]  PIR#:[id number]  Patient Care Team: No Pcp Per Patient as PCP - General (General Practice) Anastasio Auerbach, MD as Consulting Physician (Gynecology) [name], MD as Consulting Physician (General Surgery) Chauncey Cruel, MD as Consulting Physician (Oncology) Arloa Koh, MD as Consulting Physician (Radiation Oncology) Mauro Kaufmann, RN as Registered Nurse Rockwell Germany, RN as Registered Nurse Holley Bouche, NP as Nurse Practitioner (Nurse Practitioner) PCP: No PCP Per Patient OTHER MD:  CHIEF COMPLAINT:  Estrogen receptor positive breast cancer  CURRENT TREATMENT:   Adjuvant radiation   BREAST CANCER HISTORY:  from the original intake note:  "Marie Pierce" had bilateral screening mammography with tomography at the [facility] 06/18/2014. This showed a possible area of distortion in the right breast. On 06/25/2014 she was recalled for bilateral diagnostic mammography with tomography and right breast ultrasonography. The breast density was category C. In the right breast at 11:00 there was a persistent area of distortion which was not palpable by exam. Ultrasound confirmed a subtle hypoechoic mass in the area in question measuring 0.6 cm. Ultrasound of the right axilla was normal.  On 07/03/2014 the patient underwent right breast upper outer quadrant biopsy, with the pathology ([id number]) showing invasive ductal carcinoma, grade 1, estrogen receptor 100% positive, progesterone receptor 20% positive, both with strong staining intensity, with an MIB-1 of 10%, and HER-2 pending.  Her subsequent history is as detailed below.  INTERVAL HISTORY:  Marie Pierce returns today for follow-up of her breast cancer. Since her last visit here she underwent definitive right lumpectomy and sentinel lymph node sampling , on 07/17/2014. The final pathology ([id number])  showed an invasive ductal carcinoma measuring 1.5 cm, grade 1 , with all 3 sentinel lymph nodes clear. Margins were ample. Repeat HER-2 was again negative, with a signals ratio of 1.59, copy number per cell 2.15. --  To clarify the chemotherapy question an Oncotype DX was obtained which showed a score of 17, predicting a risk of recurrence outside the breast within 10 years of 11% if the patient's only systemic therapy is tamoxifen for 5 years. It also predicts no benefit from chemotherapy.    Marie Pierce has already met with radiation and is scheduled to start adjuvant radiation later this week  REVIEW OF SYSTEMS:  she exercises by walking, partly outside and partly using her treadmill, about 30 minutes, most days. She does feel tired. She has less energy. She is only working part-time now (half days instead of full days, 2 days one week in 3 days the next). She feels deconditioned. She  Has changed her diet and that is going well. She is feeling" teary". She cries easily. She wakes up at night and gets mad because now she has cancer. She knows she will do well in the long term when she just hates the whole process. She does have pain in the right breast at times. Occasionally she has palpitations. She has arthritis pains here in there which are not more intense or persistent than before.  She has the "shooting pains" in the right breast that so many patients experience at this time.A detailed review of systems today was otherwise stable.  PAST MEDICAL HISTORY: Past Medical History  Diagnosis Date  . Elevated blood pressure reading without diagnosis of hypertension     pt denies hypertension  . GERD (gastroesophageal reflux disease)   . Gastric  ulcer   . Breast cancer of upper-outer quadrant of right female breast 07/06/2014  . Breast cancer   . Arthritis     hands and feet  . PONV (postoperative nausea and vomiting)     nausea only    PAST SURGICAL HISTORY: Past Surgical History  Procedure Laterality  Date  . Abdominal hysterectomy    . Cesarean section    . Hemorrhoid surgery    . Foot surgery      Left  . Tonsillectomy    . Breast biopsy      left x2; right x 1  . Colonoscopy    . Polypectomy    . Radioactive seed guided mastectomy with axillary sentinel lymph node biopsy Right 07/17/2014    Procedure: RADIOACTIVE SEED GUIDED PARTIAL MASTECTOMY WITH AXILLARY SENTINEL LYMPH NODE BIOPSY;  Surgeon: [name], MD;  Location: [facility];  Service: General;  Laterality: Right;    FAMILY HISTORY Family History  Problem Relation Age of Onset  . Colon cancer Father 51    and dx. again at 1  . Stomach cancer Maternal Aunt 60  . Diabetes Mother   . Heart disease Mother   . Breast cancer Paternal Aunt     dx. 71s  . Breast cancer Maternal Grandmother 60  . Skin cancer Brother     not melanoma  . Colon polyps Brother     1 polyp on colonoscopy  . Breast cancer Maternal Aunt 52  . Lung cancer Maternal Uncle     smoker  . Lung cancer Maternal Uncle     smoker  . Brain cancer Maternal Uncle     tumor type unknown  . Breast cancer Cousin     underwent BL mastectomies  . Leukemia Cousin 18   The patient's parents are still living, in their early 35s per the patient had 2 brothers, no sisters. The patient's father had colon cancer at age 31. One maternal aunt (out of 40) and the maternal grandmother were both diagnosed with breast cancer over the age of 69.  GYNECOLOGIC HISTORY:  No LMP recorded. Patient has had a hysterectomy.  menarche age 32, first live birth at 2, which increases the risk of breast cancer developing. The patient is GX P2. She is  Status post TAH/BSO and did not take hormone replacement. 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 Her husband Lasel "Skip" Bolin  Is a Chief Strategy Officer. Daughter Marie Pierce is a Pharmacist, hospital in  [place] son Marie Pierce is studying at [employer] (Event organiser).    ADVANCED DIRECTIVES:  Not in place   HEALTH MAINTENANCE: Social History  Substance Use Topics  . Smoking status: Never Smoker   . Smokeless tobacco: Never Used  . Alcohol Use: 0.6 - 1.8 oz/week    1-3 Glasses of wine per week     Comment: occ     Colonoscopy: repeat due 2016  PAP:  Bone density:  Lipid panel:  Allergies  Allergen Reactions  . Morphine And Related Hives    hives  . Sulfonamide Derivatives Hives    REACTION: hives  . Ultram [Tramadol Hcl] Other (See Comments)    Pt reports she was very dizzy with this medication  . Penicillins Swelling and Rash    Swelling throat    Current Outpatient Prescriptions  Medication Sig Dispense Refill  . acetaminophen (  TYLENOL) 500 MG tablet Take 1,000 mg by mouth every 6 (six) hours as needed.      [name] BIOTIN PO Take 1 tablet by mouth daily.    . calcium carbonate (OS-CAL) 600 MG TABS tablet Take 600 mg by mouth 2 (two) times daily with a meal.    . cholecalciferol (VITAMIN D) 1000 UNITS tablet Take 1,000 Units by mouth daily.    [name] esomeprazole (NEXIUM) 40 MG capsule Take 1 capsule (40 mg total) by mouth daily before breakfast. 30 capsule 6  . fluticasone (FLONASE) 50 MCG/ACT nasal spray Place 2 sprays into both nostrils daily.  11  . glucosamine-chondroitin 500-400 MG tablet Take 1 tablet by mouth 2 (two) times daily.    . Multiple Vitamin (MULTIVITAMIN) tablet Take 1 tablet by mouth daily.    [name] oxyCODONE-acetaminophen (ROXICET) 5-325 MG per tablet Take 1-2 tablets by mouth every 4 (four) hours as needed for severe pain. (Patient not taking: Reported on 08/22/2014) 30 tablet 0  . UNABLE TO FIND 1 each by Other route as needed. Dispense per medical necessity class 1 preventive compression 20-30 mm/Hg sleeve and glove with hx of breast cancer s/p surgery 1 each 1  . vitamin C (ASCORBIC ACID) 500 MG tablet Take 500 mg by mouth daily.     No current  facility-administered medications for this visit.    OBJECTIVE:  Middle-aged white woman who appears stated age 64 Vitals:   09/03/14 1051  BP: 139/77  Pulse: 70  Temp: 98.5 F (36.9 C)  Resp: 18     Body mass index is 31.01 kg/(m^2).    ECOG FS:1 - Symptomatic but completely ambulatory  Sclerae unicteric, pupils round and equal Oropharynx clear and moist-- no thrush or other lesions No cervical or supraclavicular adenopathy Lungs no rales or rhonchi Heart regular rate and rhythm Abd soft, nontender, positive bowel sounds MSK no focal spinal tenderness, no upper extremity lymphedema Neuro: nonfocal, well oriented, appropriate affect Breasts:  The right breast is status post lumpectomy. The cosmetic result is excellent. There is no erythema, swelling, or dehiscence. There is no palpable mass. There is no skin change of concern. The right axilla is benign per the left breast is unremarkable. Skin: There is a small area of candidal rash under the left breast  LAB RESULTS:  CMP     Component Value Date/Time   NA 144 07/11/2014 0834   NA 142 05/16/2010 1522   K 4.0 07/11/2014 0834   K 4.3 05/16/2010 1522   CL 103 05/16/2010 1522   CO2 28 07/11/2014 0834   CO2 30 05/16/2010 1522   GLUCOSE 88 07/11/2014 0834   GLUCOSE 86 05/16/2010 1522   BUN 13.6 07/11/2014 0834   BUN 9 05/16/2010 1522   CREATININE 0.8 07/11/2014 0834   CREATININE 0.8 05/16/2010 1522   CALCIUM 9.8 07/11/2014 0834   CALCIUM 9.4 05/16/2010 1522   PROT 6.8 07/11/2014 0834   PROT 6.8 05/16/2010 1522   ALBUMIN 4.1 07/11/2014 0834   ALBUMIN 4.3 05/16/2010 1522   AST 20 07/11/2014 0834   AST 31 05/16/2010 1522   ALT 25 07/11/2014 0834   ALT 44* 05/16/2010 1522   ALKPHOS 107 07/11/2014 0834   ALKPHOS 76 05/16/2010 1522   BILITOT 0.45 07/11/2014 0834   BILITOT 0.8 05/16/2010 1522    INo results found for: SPEP, UPEP  Lab Results  Component Value Date   WBC 6.1 07/11/2014   NEUTROABS 3.7 07/11/2014     HGB 15.3  07/11/2014   HCT 45.6 07/11/2014   MCV 94.8 07/11/2014   PLT 180 07/11/2014      Chemistry      Component Value Date/Time   NA 144 07/11/2014 0834   NA 142 05/16/2010 1522   K 4.0 07/11/2014 0834   K 4.3 05/16/2010 1522   CL 103 05/16/2010 1522   CO2 28 07/11/2014 0834   CO2 30 05/16/2010 1522   BUN 13.6 07/11/2014 0834   BUN 9 05/16/2010 1522   CREATININE 0.8 07/11/2014 0834   CREATININE 0.8 05/16/2010 1522      Component Value Date/Time   CALCIUM 9.8 07/11/2014 0834   CALCIUM 9.4 05/16/2010 1522   ALKPHOS 107 07/11/2014 0834   ALKPHOS 76 05/16/2010 1522   AST 20 07/11/2014 0834   AST 31 05/16/2010 1522   ALT 25 07/11/2014 0834   ALT 44* 05/16/2010 1522   BILITOT 0.45 07/11/2014 0834   BILITOT 0.8 05/16/2010 1522       No results found for: LABCA2  No components found for: LABCA125  No results for input(s): INR in the last 168 hours.  Urinalysis No results found for: COLORURINE, APPEARANCEUR, LABSPEC, PHURINE, GLUCOSEU, HGBUR, BILIRUBINUR, KETONESUR, PROTEINUR, UROBILINOGEN, NITRITE, LEUKOCYTESUR  STUDIES: No results found.  ASSESSMENT: 59 y.o. [place] woman status post right breast upper outer quadrant biopsy 07/03/2014 for a clinical T1b N0, stage IA invasive ductal carcinoma, grade 1, estrogen and progesterone receptor positive, with an MIB-1 of 10% and HER-2 pending.    (1) breast conserving surgery with sentinel lymph node sampling  07/17/2014 showed a pT1c pN0, stage IA  Invasive ductal carcinoma, grade 1, with ample margins. Repeat HER-2 was again negative  (2) Oncotype sent from the definitive surgical sample showed a recurrence score of 17, predicting a risk of recurrence outside the breast within 10 years of 11% if the patient's only systemic therapy is tamoxifen for 5 years. Also predicts no benefit from chemotherapy.  (3) adjuvant radiation planned for 09/04/2014 through 10/15/2014  (4)  To start tamoxifen early November 2016  (a)   Patient is status post remote TAH-BSO  PLAN: We spent approximately 40 minutes going over his situation. She understands she has a very good prognosis overall , and specifically if she took tamoxifen for 5 years she would have an 11% chance of recurrence in her lungs liver or bones over the next 10 years.  (put positively, that is a nearly 90% chance of that NOT happening ). She can do even better than that if she takes tamoxifen for 10 years or if she takes aromatase inhibitors.   we discussed the possible toxicities, side effects and complications of these agents and after much discussion she thinks she would like to start with tamoxifen. We are not going to start so until after she has completed her radiation and recovered from it. That will be about the first week in November.   We also discussed her depression. This is not at all unusual at this point. I think she would do well with low-dose venlafaxine and I am writing her for 37.5 mg to take daily. She will let us know she has any side effects from that or if there are any complications from that.   As far as the rash under the left breast, I wrote her for ketoconazole cream to use as needed.   She will return to see me again early November. She knows to call for any problems that may develop before then.  Chauncey Cruel, MD   09/03/2014 11:08 AM Medical Oncology and Hematology [facility] [address] [place], [place] [address] Tel. [phone]    Fax. [phone]

## 2014-09-03 NOTE — Telephone Encounter (Signed)
Gave avs & calendar for November.  °

## 2014-09-04 ENCOUNTER — Ambulatory Visit
Admission: RE | Admit: 2014-09-04 | Discharge: 2014-09-04 | Disposition: A | Discharge status: Home / Self Care | Payer: 59 | Source: Ambulatory Visit | Attending: Radiation Oncology | Admitting: Radiation Oncology

## 2014-09-04 DIAGNOSIS — Z510 Encounter for antineoplastic radiation therapy: Secondary | ICD-10-CM | POA: Diagnosis not present

## 2014-09-05 ENCOUNTER — Ambulatory Visit
Admission: RE | Admit: 2014-09-05 | Discharge: 2014-09-05 | Disposition: A | Discharge status: Home / Self Care | Payer: 59 | Source: Ambulatory Visit | Attending: Radiation Oncology | Admitting: Radiation Oncology

## 2014-09-05 DIAGNOSIS — Z510 Encounter for antineoplastic radiation therapy: Secondary | ICD-10-CM | POA: Diagnosis not present

## 2014-09-05 NOTE — Addendum Note (Signed)
Encounter addended by: Benn Moulder, RN on: 09/05/2014  9:11 AM<BR>     Documentation filed: Charges VN

## 2014-09-06 ENCOUNTER — Ambulatory Visit
Admission: RE | Admit: 2014-09-06 | Discharge: 2014-09-06 | Disposition: A | Discharge status: Home / Self Care | Payer: 59 | Source: Ambulatory Visit | Attending: Radiation Oncology | Admitting: Radiation Oncology

## 2014-09-06 DIAGNOSIS — Z510 Encounter for antineoplastic radiation therapy: Secondary | ICD-10-CM | POA: Diagnosis not present

## 2014-09-07 ENCOUNTER — Ambulatory Visit
Admission: RE | Admit: 2014-09-07 | Discharge: 2014-09-07 | Disposition: A | Discharge status: Home / Self Care | Payer: 59 | Source: Ambulatory Visit | Attending: Radiation Oncology | Admitting: Radiation Oncology

## 2014-09-07 DIAGNOSIS — C50411 Malignant neoplasm of upper-outer quadrant of right female breast: Secondary | ICD-10-CM

## 2014-09-07 DIAGNOSIS — Z510 Encounter for antineoplastic radiation therapy: Secondary | ICD-10-CM | POA: Diagnosis not present

## 2014-09-07 NOTE — Progress Notes (Signed)
Pt here for patient teaching.  Pt given Radiation and You booklet, skin care instructions, Alra deodorant and Radiaplex gel. Pt reports they have watched, not watched and refused to watch the Radiation Therapy Education video on   Reviewed areas of pertinence such as fatigue, skin changes, breast tenderness and breast swelling . Pt able to give teach back of to pat skin and use unscented/gentle soap,apply Radiaplex bid, avoid applying anything to skin within 4 hours of treatment, avoid wearing an under wire bra and to use an electric razor if they must shave. Pt demonstrated understanding and verbalizes understanding of information given and will contact nursing with any questions or concerns.

## 2014-09-11 ENCOUNTER — Ambulatory Visit
Admission: RE | Admit: 2014-09-11 | Discharge: 2014-09-11 | Disposition: A | Discharge status: Home / Self Care | Payer: 59 | Source: Ambulatory Visit | Attending: Radiation Oncology | Admitting: Radiation Oncology

## 2014-09-11 DIAGNOSIS — C50411 Malignant neoplasm of upper-outer quadrant of right female breast: Secondary | ICD-10-CM

## 2014-09-11 DIAGNOSIS — Z510 Encounter for antineoplastic radiation therapy: Secondary | ICD-10-CM | POA: Diagnosis not present

## 2014-09-11 NOTE — Progress Notes (Signed)
Weekly Management Note:  Site: Right breast Current Dose:  720  cGy Projected Dose: 5040  cGy  Narrative: The patient is seen today for routine under treatment assessment. CBCT/MVCT images/port films were reviewed. The chart was reviewed.   She is without complaints today except for a few "occasional twinges in the right breast".  Physical Examination: There were no vitals filed for this visit..  Weight:  .  No skin changes.  Impression: Tolerating radiation therapy well.  Plan: Continue radiation therapy as planned.

## 2014-09-11 NOTE — Progress Notes (Signed)
Weekly rad txs right breast, no skin changes, soreness and occasional twinges in breast,  Appetite good, energy okay,  3:35 PM   Wt Readings from Last 3 Encounters:  09/03/14 169 lb 9.6 oz (76.93 kg)  08/22/14 169 lb 14.4 oz (77.066 kg)  07/17/14 164 lb 2 oz (74.447 kg)

## 2014-09-11 NOTE — Progress Notes (Signed)
Weekly rad txs right breast, no skin changes, soreness and occasional twinges in breast, Appetite good, energy okay,  3:35 PM  Wt Readings from Last 3 Encounters:  09/03/14 169 lb 9.6 oz (76.93 kg)  08/22/14 169 lb 14.4 oz (77.066 kg)  07/17/14 164 lb 2 oz (74.447 kg)

## 2014-09-12 ENCOUNTER — Ambulatory Visit
Admission: RE | Admit: 2014-09-12 | Discharge: 2014-09-12 | Disposition: A | Discharge status: Home / Self Care | Payer: 59 | Source: Ambulatory Visit | Attending: Radiation Oncology | Admitting: Radiation Oncology

## 2014-09-12 DIAGNOSIS — Z510 Encounter for antineoplastic radiation therapy: Secondary | ICD-10-CM | POA: Diagnosis not present

## 2014-09-13 ENCOUNTER — Telehealth: Payer: Self-pay | Admitting: *Deleted

## 2014-09-13 ENCOUNTER — Ambulatory Visit
Admission: RE | Admit: 2014-09-13 | Discharge: 2014-09-13 | Disposition: A | Discharge status: Home / Self Care | Payer: 59 | Source: Ambulatory Visit | Attending: Radiation Oncology | Admitting: Radiation Oncology

## 2014-09-13 DIAGNOSIS — Z510 Encounter for antineoplastic radiation therapy: Secondary | ICD-10-CM | POA: Diagnosis not present

## 2014-09-13 NOTE — Telephone Encounter (Signed)
Left vm for pt to return call regarding needs during xrt. 

## 2014-09-14 ENCOUNTER — Ambulatory Visit
Admission: RE | Admit: 2014-09-14 | Discharge: 2014-09-14 | Disposition: A | Discharge status: Home / Self Care | Payer: 59 | Source: Ambulatory Visit | Attending: Radiation Oncology | Admitting: Radiation Oncology

## 2014-09-14 DIAGNOSIS — Z510 Encounter for antineoplastic radiation therapy: Secondary | ICD-10-CM | POA: Diagnosis not present

## 2014-09-17 ENCOUNTER — Ambulatory Visit
Admission: RE | Admit: 2014-09-17 | Discharge: 2014-09-17 | Disposition: A | Discharge status: Home / Self Care | Payer: 59 | Source: Ambulatory Visit | Attending: Radiation Oncology | Admitting: Radiation Oncology

## 2014-09-17 ENCOUNTER — Encounter: Payer: Self-pay | Admitting: Radiation Oncology

## 2014-09-17 VITALS — BP 135/85 | HR 71 | Temp 98.5°F | Ht 62.0 in | Wt 167.4 lb

## 2014-09-17 DIAGNOSIS — C50411 Malignant neoplasm of upper-outer quadrant of right female breast: Secondary | ICD-10-CM

## 2014-09-17 DIAGNOSIS — Z510 Encounter for antineoplastic radiation therapy: Secondary | ICD-10-CM | POA: Diagnosis not present

## 2014-09-17 NOTE — Progress Notes (Addendum)
Marie Pierce has received 8 fractions to her right breast.  Note mild erythema and mild red rash like appearance in the upper, inner portion of her right breast and in the inframmary fold(raised like appearance). Admits to fatigue today, but attributes to working.

## 2014-09-17 NOTE — Progress Notes (Signed)
Weekly Management Note:  Site: Right breast Current Dose:  1440  cGy Projected Dose: 5040  cGy  Narrative: The patient is seen today for routine under treatment assessment. CBCT/MVCT images/port films were reviewed. The chart was reviewed.   She is without complaints today.  Physical Examination:  Filed Vitals:   09/17/14 1528  BP: 135/85  Pulse: 71  Temp: 98.5 F (36.9 C)  .  Weight: 167 lb 6.4 oz (75.932 kg).  There were a few small punctate reddish papules along the medial inframammary folds a both breast which may represent a "heat rash" or an early fungal infection.  She does not typically have  fungal infections.  Earlier this summer she had the same skin reaction and she was given a fungal cream by Dr. Jana Hakim.  There is faint erythema along the right breast.  No areas of desquamation.  Impression: Tolerating radiation therapy well.  Plan: Continue radiation therapy as planned.

## 2014-09-18 ENCOUNTER — Ambulatory Visit
Admission: RE | Admit: 2014-09-18 | Discharge: 2014-09-18 | Disposition: A | Discharge status: Home / Self Care | Payer: 59 | Source: Ambulatory Visit | Attending: Radiation Oncology | Admitting: Radiation Oncology

## 2014-09-18 DIAGNOSIS — Z510 Encounter for antineoplastic radiation therapy: Secondary | ICD-10-CM | POA: Diagnosis not present

## 2014-09-19 ENCOUNTER — Ambulatory Visit
Admission: RE | Admit: 2014-09-19 | Discharge: 2014-09-19 | Disposition: A | Discharge status: Home / Self Care | Payer: 59 | Source: Ambulatory Visit | Attending: Radiation Oncology | Admitting: Radiation Oncology

## 2014-09-19 DIAGNOSIS — C50411 Malignant neoplasm of upper-outer quadrant of right female breast: Secondary | ICD-10-CM

## 2014-09-19 DIAGNOSIS — Z510 Encounter for antineoplastic radiation therapy: Secondary | ICD-10-CM | POA: Diagnosis not present

## 2014-09-19 MED ORDER — BIAFINE EX EMUL
CUTANEOUS | Status: DC | PRN
  Administered 2014-09-19: 18:00:00 via TOPICAL

## 2014-09-19 NOTE — Addendum Note (Signed)
Encounter addended by: Benn Moulder, RN on: 09/19/2014  6:11 PM<BR>     Documentation filed: Inpatient MAR

## 2014-09-19 NOTE — Progress Notes (Signed)
Weekly Management Note:  Site: Right breast  Current Dose:  1800  cGy Projected Dose: 5040  cGy  Narrative: The patient is seen today for routine under treatment assessment. CBCT/MVCT images/port films were reviewed. The chart was reviewed.   Marie Pierce is seen today for evaluation of a papular erythematous reaction along the upper inner quadrant of her right breast.  When I saw her this week she had a similar eruption along the inferior aspect of both breasts medially.  Physical Examination: There were no vitals filed for this visit..  Weight:  .  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  I do not feel that this is fungal.  Impression: Tolerating radiation therapy well except for early papular radiation dermatitis.  If this becomes significant, we will limit her whole breast dose to 45 Gy and boost her tumor bed.  Plan: Continue radiation therapy as planned.

## 2014-09-20 ENCOUNTER — Ambulatory Visit
Admission: RE | Admit: 2014-09-20 | Discharge: 2014-09-20 | Disposition: A | Discharge status: Home / Self Care | Payer: 59 | Source: Ambulatory Visit | Attending: Radiation Oncology | Admitting: Radiation Oncology

## 2014-09-20 DIAGNOSIS — Z510 Encounter for antineoplastic radiation therapy: Secondary | ICD-10-CM | POA: Diagnosis not present

## 2014-09-21 ENCOUNTER — Ambulatory Visit
Admission: RE | Admit: 2014-09-21 | Discharge: 2014-09-21 | Disposition: A | Discharge status: Home / Self Care | Payer: 59 | Source: Ambulatory Visit | Attending: Radiation Oncology | Admitting: Radiation Oncology

## 2014-09-21 DIAGNOSIS — Z510 Encounter for antineoplastic radiation therapy: Secondary | ICD-10-CM | POA: Diagnosis not present

## 2014-09-24 ENCOUNTER — Ambulatory Visit
Admission: RE | Admit: 2014-09-24 | Discharge: 2014-09-24 | Disposition: A | Discharge status: Home / Self Care | Payer: 59 | Source: Ambulatory Visit | Attending: Radiation Oncology | Admitting: Radiation Oncology

## 2014-09-24 ENCOUNTER — Encounter: Payer: Self-pay | Admitting: Radiation Oncology

## 2014-09-24 DIAGNOSIS — Z510 Encounter for antineoplastic radiation therapy: Secondary | ICD-10-CM | POA: Diagnosis not present

## 2014-09-24 DIAGNOSIS — C50411 Malignant neoplasm of upper-outer quadrant of right female breast: Secondary | ICD-10-CM

## 2014-09-24 NOTE — Progress Notes (Signed)
Weekly Management Note:  Site: Right breast Current Dose:   2340  cGy Projected Dose:  5040  cGy  Narrative: The patient is seen today for routine under treatment assessment. CBCT/MVCT images/port films were reviewed. The chart was reviewed.    She uses to have severe pruritus along her right breast. She has been using another product for  Her radiation dermatitis which has worked better than hydrocortisone cream.  I read the ingredients, and I think that this is satisfactory.  Physical Examination: There were no vitals filed for this visit..  Weight:  .  There is a papular erythematous radiation dermatitis, particularly along the upper inner quadrant of the right breast extending just across the midline. This is consistent with a radiation dermatitis rather than a fungal infection. She does have a mild erythematous papular reaction along the  Medial aspects of both inframammary folds which is probably fungal.  Impression: Tolerating radiation therapy well.  I feel certain that we're dealing with a pruritic radiation dermatitis,  based on the location, rather than a fungal infection. I may need to stop her whole breast prescription 4500 cGy  and deliver her last 3 fractions with a beam directed photon boost.  I will assess her skin in 2 weeks. She may continue with her nonprescription skin preparation or she may want to try hydrocortisone ointment instead of hydrocortisone cream.She may take Benadryl at bedtime for pruritus.  Plan: Continue radiation therapy as planned.

## 2014-09-24 NOTE — Progress Notes (Signed)
See dictated note from earlier today. 

## 2014-09-25 ENCOUNTER — Ambulatory Visit
Admission: RE | Admit: 2014-09-25 | Discharge: 2014-09-25 | Disposition: A | Discharge status: Home / Self Care | Payer: 59 | Source: Ambulatory Visit | Attending: Radiation Oncology | Admitting: Radiation Oncology

## 2014-09-25 DIAGNOSIS — Z510 Encounter for antineoplastic radiation therapy: Secondary | ICD-10-CM | POA: Diagnosis not present

## 2014-09-26 ENCOUNTER — Ambulatory Visit
Admission: RE | Admit: 2014-09-26 | Discharge: 2014-09-26 | Disposition: A | Discharge status: Home / Self Care | Payer: 59 | Source: Ambulatory Visit | Attending: Radiation Oncology | Admitting: Radiation Oncology

## 2014-09-26 DIAGNOSIS — Z510 Encounter for antineoplastic radiation therapy: Secondary | ICD-10-CM | POA: Diagnosis not present

## 2014-09-27 ENCOUNTER — Ambulatory Visit
Admission: RE | Admit: 2014-09-27 | Discharge: 2014-09-27 | Disposition: A | Discharge status: Home / Self Care | Payer: 59 | Source: Ambulatory Visit | Attending: Radiation Oncology | Admitting: Radiation Oncology

## 2014-09-27 DIAGNOSIS — Z510 Encounter for antineoplastic radiation therapy: Secondary | ICD-10-CM | POA: Diagnosis not present

## 2014-09-28 ENCOUNTER — Ambulatory Visit
Admission: RE | Admit: 2014-09-28 | Discharge: 2014-09-28 | Disposition: A | Discharge status: Home / Self Care | Payer: 59 | Source: Ambulatory Visit | Attending: Radiation Oncology | Admitting: Radiation Oncology

## 2014-09-28 DIAGNOSIS — Z510 Encounter for antineoplastic radiation therapy: Secondary | ICD-10-CM | POA: Diagnosis not present

## 2014-10-01 ENCOUNTER — Ambulatory Visit
Admission: RE | Admit: 2014-10-01 | Discharge: 2014-10-01 | Disposition: A | Discharge status: Home / Self Care | Payer: 59 | Source: Ambulatory Visit | Attending: Radiation Oncology | Admitting: Radiation Oncology

## 2014-10-01 ENCOUNTER — Encounter: Payer: Self-pay | Admitting: Radiation Oncology

## 2014-10-01 VITALS — BP 134/68 | HR 83 | Temp 98.9°F | Ht 62.0 in | Wt 169.2 lb

## 2014-10-01 DIAGNOSIS — Z510 Encounter for antineoplastic radiation therapy: Secondary | ICD-10-CM | POA: Diagnosis not present

## 2014-10-01 DIAGNOSIS — C50411 Malignant neoplasm of upper-outer quadrant of right female breast: Secondary | ICD-10-CM

## 2014-10-01 NOTE — Progress Notes (Addendum)
Marie Pierce has received 18 fractions to her right breast. She reports achiness and itching of this area.  Note rash-like appearance to her upper inner portion of her breast and also note raised redness in the right inframmary fold for which she is using Miconazole as ordered by Dr. Jana Hakim.

## 2014-10-01 NOTE — Progress Notes (Signed)
   Weekly Management Note:  outpatient    ICD-9-CM ICD-10-CM   1. Breast cancer of upper-outer quadrant of right female breast 174.4 C50.411     Current Dose:  32.4 Gy  Projected Dose: 50.4 Gy   Narrative:  The patient presents for routine under treatment assessment.  CBCT/MVCT images/Port film x-rays were reviewed.  The chart was checked. She has diffuse itching over right breast.  Applying miconazole to IM fold, which does not itch.  Using hydrocortisone only over breast, nothing else at present.  Physical Findings:  height is 5\' 2"  (1.575 m) and weight is 169 lb 3.2 oz (76.749 kg). Her temperature is 98.9 F (37.2 C). Her blood pressure is 134/68 and her pulse is 83.   Wt Readings from Last 3 Encounters:  10/01/14 169 lb 3.2 oz (76.749 kg)  09/17/14 167 lb 6.4 oz (75.932 kg)  09/11/14 166 lb 1.6 oz (75.342 kg)   NAD, dermatitis over UIQ of right breast. Skin is dry over right breast. Erythema of IM fold.  Impression:  The patient is tolerating radiotherapy.  Plan:  Continue radiotherapy as planned. Recommended alternating biafine with  1% hydrocortisone over breast.  Continue miconazole to IM fold.  ________________________________   Eppie Gibson, M.D.

## 2014-10-02 ENCOUNTER — Ambulatory Visit
Admission: RE | Admit: 2014-10-02 | Discharge: 2014-10-02 | Disposition: A | Discharge status: Home / Self Care | Payer: 59 | Source: Ambulatory Visit | Attending: Radiation Oncology | Admitting: Radiation Oncology

## 2014-10-02 DIAGNOSIS — Z510 Encounter for antineoplastic radiation therapy: Secondary | ICD-10-CM | POA: Diagnosis not present

## 2014-10-03 ENCOUNTER — Ambulatory Visit
Admission: RE | Admit: 2014-10-03 | Discharge: 2014-10-03 | Disposition: A | Discharge status: Home / Self Care | Payer: 59 | Source: Ambulatory Visit | Attending: Radiation Oncology | Admitting: Radiation Oncology

## 2014-10-03 DIAGNOSIS — Z510 Encounter for antineoplastic radiation therapy: Secondary | ICD-10-CM | POA: Diagnosis not present

## 2014-10-04 ENCOUNTER — Ambulatory Visit
Admission: RE | Admit: 2014-10-04 | Discharge: 2014-10-04 | Disposition: A | Discharge status: Home / Self Care | Payer: 59 | Source: Ambulatory Visit | Attending: Radiation Oncology | Admitting: Radiation Oncology

## 2014-10-04 DIAGNOSIS — Z510 Encounter for antineoplastic radiation therapy: Secondary | ICD-10-CM | POA: Diagnosis not present

## 2014-10-05 ENCOUNTER — Ambulatory Visit
Admission: RE | Admit: 2014-10-05 | Discharge: 2014-10-05 | Disposition: A | Discharge status: Home / Self Care | Payer: 59 | Source: Ambulatory Visit | Attending: Radiation Oncology | Admitting: Radiation Oncology

## 2014-10-05 DIAGNOSIS — Z510 Encounter for antineoplastic radiation therapy: Secondary | ICD-10-CM | POA: Diagnosis not present

## 2014-10-08 ENCOUNTER — Ambulatory Visit
Admission: RE | Admit: 2014-10-08 | Discharge: 2014-10-08 | Disposition: A | Discharge status: Home / Self Care | Payer: 59 | Source: Ambulatory Visit | Attending: Radiation Oncology | Admitting: Radiation Oncology

## 2014-10-08 ENCOUNTER — Encounter: Payer: Self-pay | Admitting: Radiation Oncology

## 2014-10-08 VITALS — BP 131/65 | HR 83 | Temp 98.8°F | Ht 62.0 in | Wt 168.2 lb

## 2014-10-08 DIAGNOSIS — C50411 Malignant neoplasm of upper-outer quadrant of right female breast: Secondary | ICD-10-CM

## 2014-10-08 DIAGNOSIS — Z510 Encounter for antineoplastic radiation therapy: Secondary | ICD-10-CM | POA: Diagnosis not present

## 2014-10-08 MED ORDER — BIAFINE EX EMUL
CUTANEOUS | Status: DC | PRN
Start: 2014-10-08 — End: 2014-10-09

## 2014-10-08 NOTE — Progress Notes (Signed)
Weekly Management Note:  Site: Right breast Current Dose:  4140  cGy Projected Dose: 5040  cGy  Narrative: The patient is seen today for routine under treatment assessment. CBCT/MVCT images/port films were reviewed. The chart was reviewed.   She reports worsening right breast pain particularly along her inframammary region where she is developing a moist desquamation.  She did have significant discomfort and pruritus along the upper inner quadrant of the right breast.  She has been using Biafine cream.  Physical Examination:  Filed Vitals:   10/08/14 1515  BP: 131/65  Pulse: 83  Temp: 98.8 F (37.1 C)  .  Weight: 168 lb 3.2 oz (76.295 kg).  There is marked erythema along the right breast with a papular erythematous dry desquamation along the upper inner quadrant along with impending moist desquamation along the entire right inframammary fold.  Impression: Tolerating radiation therapy well except for radiation dermatitis with impending moist desquamation.  I feel that we need to reach a dose of at least 4500 cGy to the entire right breast, and then we will give her final 3 fractions through a boost technique to reach the same target dose of 5040 cGy.  Plan: Continue radiation therapy as planned.

## 2014-10-08 NOTE — Progress Notes (Signed)
Ms. Rubey reports increased aching of right axilla and right breast.  Note increased dryness and redness in the upper, inner portion of her [place] field with erythema in the inframmary fold with mild peeling of skin.  Note increased erythema in the back adjacent to her axilla.  States she has chills intermittently as compared to chills she has experienced in the past with sunburns.  Biafine given.

## 2014-10-09 ENCOUNTER — Ambulatory Visit
Admission: RE | Admit: 2014-10-09 | Discharge: 2014-10-09 | Disposition: A | Discharge status: Home / Self Care | Payer: 59 | Source: Ambulatory Visit | Attending: Radiation Oncology | Admitting: Radiation Oncology

## 2014-10-09 ENCOUNTER — Encounter: Payer: Self-pay | Admitting: Radiation Oncology

## 2014-10-09 ENCOUNTER — Ambulatory Visit: Payer: 59 | Admitting: Radiation Oncology

## 2014-10-09 DIAGNOSIS — Z510 Encounter for antineoplastic radiation therapy: Secondary | ICD-10-CM | POA: Diagnosis not present

## 2014-10-09 NOTE — Progress Notes (Signed)
Complex simulation note: The patient underwent virtual simulation for her right breast 3 field boost.  She was set up to a 3 field technique.  3 separate and unique multileaf collimators were designed to conform the field.  An isodose plan is reviewed.  I am prescribing 540 cGy in 3 sessions utilizing mixed 6 MV/10 MV photons.

## 2014-10-10 ENCOUNTER — Ambulatory Visit
Admission: RE | Admit: 2014-10-10 | Discharge: 2014-10-10 | Disposition: A | Discharge status: Home / Self Care | Payer: 59 | Source: Ambulatory Visit | Attending: Radiation Oncology | Admitting: Radiation Oncology

## 2014-10-10 DIAGNOSIS — Z510 Encounter for antineoplastic radiation therapy: Secondary | ICD-10-CM | POA: Diagnosis not present

## 2014-10-11 ENCOUNTER — Ambulatory Visit
Admission: RE | Admit: 2014-10-11 | Discharge: 2014-10-11 | Disposition: A | Discharge status: Home / Self Care | Payer: 59 | Source: Ambulatory Visit | Attending: Radiation Oncology | Admitting: Radiation Oncology

## 2014-10-11 DIAGNOSIS — Z510 Encounter for antineoplastic radiation therapy: Secondary | ICD-10-CM | POA: Diagnosis not present

## 2014-10-12 ENCOUNTER — Ambulatory Visit
Admission: RE | Admit: 2014-10-12 | Discharge: 2014-10-12 | Disposition: A | Discharge status: Home / Self Care | Payer: 59 | Source: Ambulatory Visit | Attending: Radiation Oncology | Admitting: Radiation Oncology

## 2014-10-12 DIAGNOSIS — Z510 Encounter for antineoplastic radiation therapy: Secondary | ICD-10-CM | POA: Diagnosis not present

## 2014-10-12 DIAGNOSIS — C50411 Malignant neoplasm of upper-outer quadrant of right female breast: Secondary | ICD-10-CM

## 2014-10-12 MED ORDER — BIAFINE EX EMUL
CUTANEOUS | Status: DC | PRN
  Administered 2014-10-12: 16:00:00 via TOPICAL

## 2014-10-15 ENCOUNTER — Ambulatory Visit
Admission: RE | Admit: 2014-10-15 | Discharge: 2014-10-15 | Disposition: A | Discharge status: Home / Self Care | Payer: 59 | Source: Ambulatory Visit | Attending: Radiation Oncology | Admitting: Radiation Oncology

## 2014-10-15 ENCOUNTER — Encounter: Payer: Self-pay | Admitting: Radiation Oncology

## 2014-10-15 VITALS — BP 139/72 | HR 103 | Temp 98.4°F | Ht 62.0 in | Wt 169.0 lb

## 2014-10-15 DIAGNOSIS — Z510 Encounter for antineoplastic radiation therapy: Secondary | ICD-10-CM | POA: Diagnosis not present

## 2014-10-15 DIAGNOSIS — C50411 Malignant neoplasm of upper-outer quadrant of right female breast: Secondary | ICD-10-CM

## 2014-10-15 NOTE — Progress Notes (Signed)
Weekly Management Note:  Site: right breast Current Dose:   4860  cGy Projected Dose:  4860  cGy  Narrative: The patient is seen today for routine under treatment assessment. CBCT/MVCT images/port films were reviewed. The chart was reviewed.    She finishes her radiation therapy today. She continues with Biafine cream. She is most symptomatic along the upper inner quadrant of her right breast as expected. She has been using antibiotic ointment along the inframammary region. She noticed a big improvement beginning late last week.  Physical Examination:  Filed Vitals:   10/15/14 1508  BP: 139/72  Pulse: 103  Temp: 98.4 F (36.9 C)  .  Weight: 169 lb (76.658 kg).  There is moderate to marked erythema along the right breast with dry desquamation particularly along the upper inner quadrant of the right breast.  There is very focal residual moist desquamation along the mid inframammary region over  1-2 cm area.  Impression: Tolerating radiation therapy well.  Radiation therapy completed.  She'll continue with Biafine cream for at least another week.  Plan:  Follow-up visit here one month.

## 2014-10-15 NOTE — Progress Notes (Signed)
Marie Pierce completes today.  Erythema  Right breast with rash-like, dry appearance in the upper inner quadrant that she describes as itchy.  Mild moist desquamation in the inframmary fold and is using hydrogel pads at this time.

## 2014-10-15 NOTE — Progress Notes (Signed)
Simulation verification note: The patient underwent  simulation verification on 10/11/2014 for her right breast boost.  She was set up to 3 field technique.  Her isocenter was in good position and the multileaf collimators contoured the treatment volume appropriately.

## 2014-10-15 NOTE — Progress Notes (Signed)
[facility] End of Treatment Note  Name:Wende HAYLEY HORN  Date: 10/15/2014 BHA:[id number] DOB:August 26, 1955   Status:outpatient    CC: No PCP Per Patient   Dr. [name]  REFERRING PHYSICIAN:    Dr. 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 Three-field right breast boost with mixed 6 MV/10 MV photons.                 NARRATIVE:     Ms. Haefele tolerated her treatment reasonably well but she developed a brisk radiation dermatitis which became papular and quite pruritic particularly along the upper inner quadrant of the right breast. She was given Biafine cream to use as needed and also instructed to use hydrocortisone cream as needed. She also began to develop a moist desquamation along the inframammary region and it was decided to limit the right breast dose to 4500 cGy and then boost her tumor bed to bring her cumulative tumor bed dose up to 5040 cGy in 28 sessions.                        PLAN: Routine followup in one month. Patient instructed to call if questions or worsening complaints in interim.

## 2014-10-18 ENCOUNTER — Other Ambulatory Visit: Payer: Self-pay | Admitting: Adult Health

## 2014-10-18 DIAGNOSIS — C50411 Malignant neoplasm of upper-outer quadrant of right female breast: Secondary | ICD-10-CM

## 2014-10-19 ENCOUNTER — Telehealth: Payer: Self-pay | Admitting: Oncology

## 2014-10-19 NOTE — Telephone Encounter (Signed)
lvm for pt regarding to NOV appt.. °

## 2014-10-30 ENCOUNTER — Telehealth: Payer: Self-pay | Admitting: *Deleted

## 2014-10-30 NOTE — Telephone Encounter (Signed)
Left vm for pt to return call to assess needs after xrt. 

## 2014-11-01 ENCOUNTER — Encounter: Payer: Self-pay | Admitting: Internal Medicine

## 2014-11-01 ENCOUNTER — Other Ambulatory Visit: Payer: Self-pay | Admitting: Internal Medicine

## 2014-11-01 ENCOUNTER — Ambulatory Visit (INDEPENDENT_AMBULATORY_CARE_PROVIDER_SITE_OTHER): Payer: 59 | Admitting: Internal Medicine

## 2014-11-01 VITALS — BP 120/84 | HR 88 | Ht 65.0 in | Wt 166.0 lb

## 2014-11-01 DIAGNOSIS — K219 Gastro-esophageal reflux disease without esophagitis: Secondary | ICD-10-CM | POA: Diagnosis not present

## 2014-11-01 DIAGNOSIS — K5901 Slow transit constipation: Secondary | ICD-10-CM

## 2014-11-01 DIAGNOSIS — R05 Cough: Secondary | ICD-10-CM | POA: Diagnosis not present

## 2014-11-01 DIAGNOSIS — Z86010 Personal history of colonic polyps: Secondary | ICD-10-CM

## 2014-11-01 DIAGNOSIS — R059 Cough, unspecified: Secondary | ICD-10-CM

## 2014-11-01 MED ORDER — NA SULFATE-K SULFATE-MG SULF 17.5-3.13-1.6 GM/177ML PO SOLN: ORAL | Status: DC

## 2014-11-01 NOTE — Progress Notes (Signed)
HISTORY OF PRESENT ILLNESS:  Marie Pierce is a 59 y.o. female with a history of GERD, peptic ulcer disease, and adenomatous colon polyps. She presents today regarding management of GERD, question the need for upper endoscopy, and surveillance colonoscopy. She was last evaluated 05/20/2011 for upper endoscopy in an effort to follow up healing of gastric ulcer. The examination was normal. She is also known to have GERD and was instructed to continue reflux precautions and Nexium. For the most part she has done well. She will experience occasional breakthrough reflux symptoms with dietary indiscretion. Has been having a nonproductive cough at night intermittently. States she feels like there is something in her throat. Wonders if endoscopy is warranted. Her last colonoscopy was performed November 2010. Previous examinations 2000, 2003, 2006, and 2008. She does have a history of adenomas and a family history of colon cancer in her father. Since her last evaluation in 2013 she has been diagnosed with breast cancer (4 months ago) and is doing well. Review of laboratories from 07/11/2014 were unremarkable including CBC and comprehensive metabolic panel. GI review of systems is remarkable for recent constipation likely secondary to Effexor and hemorrhoids.  REVIEW OF SYSTEMS:  All non-GI ROS negative except for arthritis, cough, itching, breast changes  Past Medical History  Diagnosis Date  . Elevated blood pressure reading without diagnosis of hypertension     pt denies hypertension  . GERD (gastroesophageal reflux disease)   . Gastric ulcer   . Breast cancer of upper-outer quadrant of right female breast ([facility]) 07/06/2014  . Breast cancer ([place])   . Arthritis     hands and feet  . PONV (postoperative nausea and vomiting)     nausea only  . Colon polyps     adenomatous    Past Surgical History  Procedure Laterality Date  . Abdominal hysterectomy    . Cesarean section    . Hemorrhoid surgery    .  Foot surgery      Left  . Tonsillectomy    . Breast biopsy      left x2; right x 1  . Colonoscopy    . Polypectomy    . Radioactive seed guided mastectomy with axillary sentinel lymph node biopsy Right 07/17/2014    Procedure: RADIOACTIVE SEED GUIDED PARTIAL MASTECTOMY WITH AXILLARY SENTINEL LYMPH NODE BIOPSY;  Surgeon: [name], MD;  Location: [facility];  Service: General;  Laterality: Right;    Social History Marie Pierce  reports that she has never smoked. She has never used smokeless tobacco. She reports that she drinks about 0.6 - 1.8 oz of alcohol per week. She reports that she does not use illicit drugs.  family history includes Brain cancer in her maternal uncle; Breast cancer in her cousin and paternal aunt; Breast cancer (age of onset: 56) in her maternal aunt; Breast cancer (age of onset: 29) in her maternal grandmother; Colon cancer (age of onset: 35) in her father; Colon polyps in her brother; Diabetes in her mother; Heart disease in her mother; Leukemia (age of onset: 37) in her cousin; Lung cancer in her maternal uncle and maternal uncle; Skin cancer in her brother; Stomach cancer (age of onset: 26) in her maternal aunt.  Allergies  Allergen Reactions  . Morphine And Related Hives    hives  . Sulfonamide Derivatives Hives    REACTION: hives  . Ultram [Tramadol Hcl] Other (See Comments)    Pt reports she was very dizzy with this medication  .  Penicillins Swelling and Rash    Swelling throat       PHYSICAL EXAMINATION: Vital signs: BP 120/84 mmHg  Pulse 88  Ht 5\' 5"  (1.651 m)  Wt 166 lb (75.297 kg)  BMI 27.62 kg/m2  Constitutional: generally well-appearing, no acute distress Psychiatric: alert and oriented x3, cooperative Eyes: extraocular movements intact, anicteric, conjunctiva pink Mouth: oral pharynx moist, no lesions Neck: supple no lymphadenopathy Cardiovascular: heart regular rate and rhythm, no murmur Lungs: clear to auscultation  bilaterally Abdomen: soft, nontender, nondistended, no obvious ascites, no peritoneal signs, normal bowel sounds, no organomegaly Rectal: Deferred until colonoscopy Extremities: no clubbing cyanosis or lower extremity edema bilaterally Skin: no lesions on visible extremities Neuro: No focal deficits. Cranial nerves intact  ASSESSMENT:  #1. Chronic GERD. For the most part symptoms controlled with PPI. No dysphagia or alarm features #2. History of gastric ulcer. Followed to healing May 2013 (normal endoscopy) #3. Nonspecific intermittent dry cough at night. Not clear if this is related to GERD. No indication for endoscopy #4. History of adenomatous colon polyps and family history of colon cancer. Due for surveillance. Last examination 2010 (normal colon without polyps) #5. Slow transit constipation  PLAN:  #1. Strict attention to reflux precautions. #2. Continue Nexium #3. Schedule surveillance colonoscopy.The nature of the procedure, as well as the risks, benefits, and alternatives were carefully and thoroughly reviewed with the patient. Ample time for discussion and questions allowed. The patient understood, was satisfied, and agreed to proceed. #4. Stool softeners or MiraLAX as needed for constipation

## 2014-11-01 NOTE — Patient Instructions (Signed)

## 2014-11-02 ENCOUNTER — Telehealth: Payer: Self-pay | Admitting: *Deleted

## 2014-11-02 NOTE — Telephone Encounter (Signed)
Spoke to pt concerning needs after xrt. Relate doing well and without complaints. Encourage pt to call with questions or concerns. Received verbal understanding. Confirmed future appts.

## 2014-11-05 ENCOUNTER — Other Ambulatory Visit: Payer: Self-pay | Admitting: *Deleted

## 2014-11-05 DIAGNOSIS — C50411 Malignant neoplasm of upper-outer quadrant of right female breast: Secondary | ICD-10-CM

## 2014-11-06 ENCOUNTER — Ambulatory Visit (HOSPITAL_BASED_OUTPATIENT_CLINIC_OR_DEPARTMENT_OTHER): Payer: 59 | Admitting: Oncology

## 2014-11-06 ENCOUNTER — Other Ambulatory Visit (HOSPITAL_BASED_OUTPATIENT_CLINIC_OR_DEPARTMENT_OTHER): Payer: 59

## 2014-11-06 VITALS — BP 152/84 | HR 87 | Temp 98.2°F | Resp 18 | Ht 65.0 in | Wt 167.7 lb

## 2014-11-06 DIAGNOSIS — Z170 Estrogen receptor positive status [ER+]: Secondary | ICD-10-CM

## 2014-11-06 DIAGNOSIS — C50411 Malignant neoplasm of upper-outer quadrant of right female breast: Secondary | ICD-10-CM | POA: Diagnosis not present

## 2014-11-06 LAB — CBC WITH DIFFERENTIAL/PLATELET
BASO%: 1.1 % (ref 0.0–2.0)
BASOS ABS: 0 10*3/uL (ref 0.0–0.1)
EOS ABS: 0.1 10*3/uL (ref 0.0–0.5)
EOS%: 2.2 % (ref 0.0–7.0)
HCT: 44.4 % (ref 34.8–46.6)
HGB: 14.9 g/dL (ref 11.6–15.9)
LYMPH%: 18.7 % (ref 14.0–49.7)
MCH: 31.9 pg (ref 25.1–34.0)
MCHC: 33.5 g/dL (ref 31.5–36.0)
MCV: 95.5 fL (ref 79.5–101.0)
MONO#: 0.4 10*3/uL (ref 0.1–0.9)
MONO%: 9.5 % (ref 0.0–14.0)
NEUT#: 2.7 10*3/uL (ref 1.5–6.5)
NEUT%: 68.5 % (ref 38.4–76.8)
PLATELETS: 161 10*3/uL (ref 145–400)
RBC: 4.65 10*6/uL (ref 3.70–5.45)
RDW: 12.7 % (ref 11.2–14.5)
WBC: 4 10*3/uL (ref 3.9–10.3)
lymph#: 0.7 10*3/uL — ABNORMAL LOW (ref 0.9–3.3)

## 2014-11-06 LAB — COMPREHENSIVE METABOLIC PANEL (CC13)
ALT: 29 U/L (ref 0–55)
ANION GAP: 7 meq/L (ref 3–11)
AST: 23 U/L (ref 5–34)
Albumin: 4 g/dL (ref 3.5–5.0)
Alkaline Phosphatase: 103 U/L (ref 40–150)
BUN: 9.1 mg/dL (ref 7.0–26.0)
CO2: 28 meq/L (ref 22–29)
Calcium: 9.3 mg/dL (ref 8.4–10.4)
Chloride: 106 mEq/L (ref 98–109)
Creatinine: 0.7 mg/dL (ref 0.6–1.1)
EGFR: 89 mL/min/{1.73_m2} — ABNORMAL LOW (ref >90)
GLUCOSE: 121 mg/dL (ref 70–140)
POTASSIUM: 4.1 meq/L (ref 3.5–5.1)
SODIUM: 142 meq/L (ref 136–145)
Total Protein: 6.8 g/dL (ref 6.4–8.3)

## 2014-11-06 NOTE — Progress Notes (Signed)
[facility]  [phone] [phone]     ID: Marie Pierce DOB: 11/09/1955  MR#: [id number]  FVW#:[id number]  Patient Care Team: No Pcp Per Patient as PCP - General (General Practice) Anastasio Auerbach, MD as Consulting Physician (Gynecology) [name], MD as Consulting Physician (General Surgery) Chauncey Cruel, MD as Consulting Physician (Oncology) Arloa Koh, MD as Consulting Physician (Radiation Oncology) Mauro Kaufmann, RN as Registered Nurse Rockwell Germany, RN as Registered Nurse Holley Bouche, NP as Nurse Practitioner (Nurse Practitioner) PCP: No PCP Per Patient OTHER MD:  CHIEF COMPLAINT:  Estrogen receptor positive breast cancer  CURRENT TREATMENT:   tamoxifen   BREAST CANCER HISTORY:  from the original intake note:  "Marie Pierce" had bilateral screening mammography with tomography at the [facility] 06/18/2014. This showed a possible area of distortion in the right breast. On 06/25/2014 she was recalled for bilateral diagnostic mammography with tomography and right breast ultrasonography. The breast density was category C. In the right breast at 11:00 there was a persistent area of distortion which was not palpable by exam. Ultrasound confirmed a subtle hypoechoic mass in the area in question measuring 0.6 cm. Ultrasound of the right axilla was normal.  On 07/03/2014 the patient underwent right breast upper outer quadrant biopsy, with the pathology ([id number]) showing invasive ductal carcinoma, grade 1, estrogen receptor 100% positive, progesterone receptor 20% positive, both with strong staining intensity, with an MIB-1 of 10%, and HER-2 pending.  Her subsequent history is as detailed below.  INTERVAL HISTORY:  Marie Pierce returns today for follow-up of her breast cancer. She completed adjuvant radiation therapy about 2 and half weeks ago. She did mostly well with it although she did have some wet desquamation in the inferior mammary  fold. She had significant pain associated with that. Luckily that all has resolved. She never was fatigue. She kept herself on a walking schedule and was able to continue to work pretty much full-time during the treatments. She is now ready to start anti-estrogens  REVIEW OF SYSTEMS: She's had a little bit of upper respiratory congestion and associated with that is a little dry cough. She's had slightly looser bowel movements recently. She has joint and back pain which is not more intense or persistent than before. A detailed review of systems today was otherwise stable.  PAST MEDICAL HISTORY: Past Medical History  Diagnosis Date  . Elevated blood pressure reading without diagnosis of hypertension     pt denies hypertension  . GERD (gastroesophageal reflux disease)   . Gastric ulcer   . Breast cancer of upper-outer quadrant of right female breast ([facility]) 07/06/2014  . Breast cancer ([facility])   . Arthritis     hands and feet  . PONV (postoperative nausea and vomiting)     nausea only  . Colon polyps     adenomatous    PAST SURGICAL HISTORY: Past Surgical History  Procedure Laterality Date  . Abdominal hysterectomy    . Cesarean section    . Hemorrhoid surgery    . Foot surgery      Left  . Tonsillectomy    . Breast biopsy      left x2; right x 1  . Colonoscopy    . Polypectomy    . Radioactive seed guided mastectomy with axillary sentinel lymph node biopsy Right 07/17/2014    Procedure: RADIOACTIVE SEED GUIDED PARTIAL MASTECTOMY WITH AXILLARY SENTINEL LYMPH NODE BIOPSY;  Surgeon: [name], MD;  [facility];  Service: General;  Laterality: Right;    FAMILY HISTORY Family History  Problem Relation Age of Onset  . Colon cancer Father 33    and dx. again at 17  . Stomach cancer Maternal Aunt 60  . Diabetes Mother   . Heart disease Mother   . Breast cancer Paternal Aunt     dx. 54s  . Breast cancer Maternal Grandmother 64  . Skin cancer Brother     not  melanoma  . Colon polyps Brother     1 polyp on colonoscopy  . Breast cancer Maternal Aunt 52  . Lung cancer Maternal Uncle     smoker  . Lung cancer Maternal Uncle     smoker  . Brain cancer Maternal Uncle     tumor type unknown  . Breast cancer Cousin     underwent BL mastectomies  . Leukemia Cousin 57   The patient's parents are still living, in their early 12s per the patient had 2 brothers, no sisters. The patient's father had colon cancer at age 41. One maternal aunt (out of 32) and the maternal grandmother were both diagnosed with breast cancer over the age of 67.  GYNECOLOGIC HISTORY:  No LMP recorded. Patient has had a hysterectomy.  menarche age 44, first live birth at 71, which increases the risk of breast cancer developing. The patient is GX P2. She is  Status post TAH/BSO and did not take hormone replacement. 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 Her husband Lasel "Skip" Manship  Is a Chief Strategy Officer. Daughter [name] is a Pharmacist, hospital in [place] son [name] is studying at [employer] (Event organiser).    ADVANCED DIRECTIVES:  Not in place   HEALTH MAINTENANCE: Social History  Substance Use Topics  . Smoking status: Never Smoker   . Smokeless tobacco: Never Used  . Alcohol Use: 0.6 - 1.8 oz/week    1-3 Glasses of wine per week     Comment: occ     Colonoscopy: scheduled DEC 2016/ [facility]  PAP:  Bone density:  Lipid panel:  Allergies  Allergen Reactions  . Morphine And Related Hives    hives  . Sulfonamide Derivatives Hives    REACTION: hives  . Ultram [Tramadol Hcl] Other (See Comments)    Pt reports she was very dizzy with this medication  . Penicillins Swelling and Rash    Swelling throat    Current Outpatient Prescriptions  Medication Sig Dispense Refill  . acetaminophen (TYLENOL) 500 MG tablet Take 1,000 mg by mouth  every 6 (six) hours as needed.      [name] BIOTIN PO Take 1 tablet by mouth daily.    . calcium carbonate (OS-CAL) 600 MG TABS tablet Take 600 mg by mouth 2 (two) times daily with a meal.    . cholecalciferol (VITAMIN D) 1000 UNITS tablet Take 1,000 Units by mouth daily.    . cyanocobalamin 500 MCG tablet Take 500 mcg by mouth daily.    [name] esomeprazole (NEXIUM) 40 MG capsule Take 1 capsule (40 mg total) by mouth daily before breakfast. 30 capsule 6  . esomeprazole (NEXIUM) 40 MG capsule TAKE ONE CAPSULE BY MOUTH EVERY DAY BEFORE BREAKFAST 90 capsule 3  . fluticasone (FLONASE) 50 MCG/ACT nasal spray Place 2 sprays into both nostrils daily.  11  . glucosamine-chondroitin 500-400 MG tablet Take 1  tablet by mouth 2 (two) times daily.    [name] ketoconazole (NIZORAL) 2 % cream Apply 1 application topically daily. 15 g 0  . Multiple Vitamin (MULTIVITAMIN) tablet Take 1 tablet by mouth daily.    . Na Sulfate-K Sulfate-Mg Sulf SOLN Suprep-Use as directed 354 mL 0  . tamoxifen (NOLVADEX) 20 MG tablet Take 1 tablet (20 mg total) by mouth daily. 90 tablet 4  . venlafaxine XR (EFFEXOR-XR) 37.5 MG 24 hr capsule Take 1 capsule (37.5 mg total) by mouth daily with breakfast. 90 capsule 3  . vitamin C (ASCORBIC ACID) 500 MG tablet Take 500 mg by mouth daily.     No current facility-administered medications for this visit.    OBJECTIVE:  Middle-aged white woman in no acute distress Filed Vitals:   11/06/14 1340  BP: 152/84  Pulse: 87  Temp: 98.2 F (36.8 C)  Resp: 18     Body mass index is 27.91 kg/(m^2).    ECOG FS:0 - Asymptomatic  Sclerae unicteric, pupils round and equal Oropharynx clear and moist-- no thrush or other lesions No cervical or supraclavicular adenopathy Lungs no rales or rhonchi Heart regular rate and rhythm Abd soft, nontender, positive bowel sounds MSK no focal spinal tenderness, no upper extremity lymphedema Neuro: nonfocal, well oriented, appropriate affect Breasts: The right breast is  status post lumpectomy and radiation. There is essentially no residual erythema. The cosmetic result is excellent although the right breast and that up about 10% smaller than the left. There is no mass or skin change of concern. The right axilla is benign. The left breast is unremarkable.  LAB RESULTS:  CMP     Component Value Date/Time   NA 144 07/11/2014 0834   NA 142 05/16/2010 1522   K 4.0 07/11/2014 0834   K 4.3 05/16/2010 1522   CL 103 05/16/2010 1522   CO2 28 07/11/2014 0834   CO2 30 05/16/2010 1522   GLUCOSE 88 07/11/2014 0834   GLUCOSE 86 05/16/2010 1522   BUN 13.6 07/11/2014 0834   BUN 9 05/16/2010 1522   CREATININE 0.8 07/11/2014 0834   CREATININE 0.8 05/16/2010 1522   CALCIUM 9.8 07/11/2014 0834   CALCIUM 9.4 05/16/2010 1522   PROT 6.8 07/11/2014 0834   PROT 6.8 05/16/2010 1522   ALBUMIN 4.1 07/11/2014 0834   ALBUMIN 4.3 05/16/2010 1522   AST 20 07/11/2014 0834   AST 31 05/16/2010 1522   ALT 25 07/11/2014 0834   ALT 44* 05/16/2010 1522   ALKPHOS 107 07/11/2014 0834   ALKPHOS 76 05/16/2010 1522   BILITOT 0.45 07/11/2014 0834   BILITOT 0.8 05/16/2010 1522    INo results found for: SPEP, UPEP  Lab Results  Component Value Date   WBC 4.0 11/06/2014   NEUTROABS 2.7 11/06/2014   HGB 14.9 11/06/2014   HCT 44.4 11/06/2014   MCV 95.5 11/06/2014   PLT 161 11/06/2014      Chemistry      Component Value Date/Time   NA 144 07/11/2014 0834   NA 142 05/16/2010 1522   K 4.0 07/11/2014 0834   K 4.3 05/16/2010 1522   CL 103 05/16/2010 1522   CO2 28 07/11/2014 0834   CO2 30 05/16/2010 1522   BUN 13.6 07/11/2014 0834   BUN 9 05/16/2010 1522   CREATININE 0.8 07/11/2014 0834   CREATININE 0.8 05/16/2010 1522      Component Value Date/Time   CALCIUM 9.8 07/11/2014 0834   CALCIUM 9.4 05/16/2010 1522   ALKPHOS 107  07/11/2014 0834   ALKPHOS 76 05/16/2010 1522   AST 20 07/11/2014 0834   AST 31 05/16/2010 1522   ALT 25 07/11/2014 0834   ALT 44* 05/16/2010 1522    BILITOT 0.45 07/11/2014 0834   BILITOT 0.8 05/16/2010 1522       No results found for: LABCA2  No components found for: [id number]  No results for input(s): INR in the last 168 hours.  Urinalysis No results found for: COLORURINE, APPEARANCEUR, LABSPEC, PHURINE, GLUCOSEU, HGBUR, BILIRUBINUR, KETONESUR, PROTEINUR, UROBILINOGEN, NITRITE, LEUKOCYTESUR  STUDIES: No results found.  ASSESSMENT: 59 y.o. [place] woman status post right breast upper outer quadrant biopsy 07/03/2014 for a clinical T1b N0, stage IA invasive ductal carcinoma, grade 1, estrogen and progesterone receptor positive, with an MIB-1 of 10% and HER-2 pending.    (1) breast conserving surgery with sentinel lymph node sampling  07/17/2014 showed a pT1c pN0, stage IA  Invasive ductal carcinoma, grade 1, with ample margins. Repeat HER-2 was again negative  (2) Oncotype sent from the definitive surgical sample showed a recurrence score of 17, predicting a risk of recurrence outside the breast within 10 years of 11% if the patient's only systemic therapy is tamoxifen for 5 years. Also predicts no benefit from chemotherapy.  (3) adjuvant radiation 09/05/2014 through 10/15/2014:  Right breast 4500 cGy in 25 sessions , right breast tumor bed boost 540 cGy in 3 sessions   (4)  Started tamoxifen 11/06/2014  (a)  Patient is status post remote TAH-BSO  PLAN: Marie Pierce has completed her local treatment for her early-stage estrogen receptor positive breast cancer. Just with that she has L very low risk of local recurrence.  She will make that risk even lower by taking anti-estrogens for 5 years. Of course anti-estrogens are systemic so they will also, more importantly, cut in half her risk of this breast cancer recurring in her lungs, liver, or bones. They will also cut in half her risk of developing a new breast cancer in either breast area  We had previously discussed the  difference between tamoxifen and anastrozole and she is attracted to the idea of tamoxifen particularly because she status post hysterectomy and because she took oral contraceptives for about 10 years with no complications. This means her risk of developing a clot from tamoxifen is going to be correspondingly low.  Accordingly she will start tamoxifen today. We reviewed the possible toxicities, side effects and complications and she will call with any problems that may develop before her next visit here, which will be in 3 months. If all is going well at that time we will schedule her routine follow-up from that point  Chauncey Cruel, MD   11/06/2014 1:54 PM Medical Oncology and Hematology [facility] [place], [place] [address] Tel. [phone]    Fax. [phone]

## 2014-11-07 ENCOUNTER — Telehealth: Payer: Self-pay | Admitting: Oncology

## 2014-11-07 NOTE — Telephone Encounter (Signed)
Called patient per pof and left a message with a 24month appointment

## 2014-11-27 ENCOUNTER — Encounter (HOSPITAL_COMMUNITY): Payer: Self-pay

## 2014-12-03 ENCOUNTER — Encounter: Payer: Self-pay | Admitting: Radiation Oncology

## 2014-12-04 ENCOUNTER — Ambulatory Visit
Admission: RE | Admit: 2014-12-04 | Discharge: 2014-12-04 | Disposition: A | Discharge status: Home / Self Care | Payer: 59 | Source: Ambulatory Visit | Attending: Radiation Oncology | Admitting: Radiation Oncology

## 2014-12-04 ENCOUNTER — Ambulatory Visit (HOSPITAL_BASED_OUTPATIENT_CLINIC_OR_DEPARTMENT_OTHER): Payer: 59 | Admitting: Nurse Practitioner

## 2014-12-04 ENCOUNTER — Encounter: Payer: Self-pay | Admitting: Nurse Practitioner

## 2014-12-04 ENCOUNTER — Encounter: Payer: Self-pay | Admitting: Radiation Oncology

## 2014-12-04 VITALS — BP 134/66 | HR 85 | Temp 98.1°F

## 2014-12-04 VITALS — BP 134/66 | HR 85 | Temp 98.1°F | Ht 65.0 in | Wt 169.3 lb

## 2014-12-04 DIAGNOSIS — Z170 Estrogen receptor positive status [ER+]: Secondary | ICD-10-CM

## 2014-12-04 DIAGNOSIS — C50411 Malignant neoplasm of upper-outer quadrant of right female breast: Secondary | ICD-10-CM

## 2014-12-04 HISTORY — DX: Personal history of irradiation: Z92.3

## 2014-12-04 NOTE — Progress Notes (Signed)
Marie Pierce here for reassessment s/p radiation to her breast.  Note tanning in tx field with intact skin.

## 2014-12-04 NOTE — Progress Notes (Signed)
CLINIC:  Cancer Survivorship   REASON FOR VISIT:  Routine follow-up post-treatment for a recent history of breast cancer.  BRIEF ONCOLOGIC HISTORY:    Breast cancer of upper-outer quadrant of right female breast ([place])   06/18/2014 Mammogram Right breast: possible distortion warrants further evaluation   06/25/2014 Breast US Right breast: subtle irregular hypoechoic mass at the 11 o'clock position 9 cm from the nipple with shadowing. This measures 6 x 5 x 5 mm in size.    07/03/2014 Initial Biopsy Breast, right, needle core biopsy: Invasive ductal carcinoma, ER+ (100%), PR+ (20%), HER2neu negative (ratio 1.35), Ki67 10%   07/03/2014 Clinical Stage Stage IA:  T1b N0   07/17/2014 Procedure OvaNext panel reveals no clinically significant variant at ATM, BARD1, BRCA1, BRCA2, BRIP1, CDH1, CHEK2, MLH1, MRE11A, MSH2, MSH6, MUTYH, NBN, NF1, PALB2, PMS2, PTEN, RAD50, RAD51C, RAD51D, SMARCA4, STK11, and TP53.   07/17/2014 Definitive Surgery Right lumpectomy / [facility] Barry Dienes): Invasive ductal carcinoma with calcifications, grade 1, spans 1.5 cm, low grade DCIS, LCIS, HER2/neu repeated and remains negative (ratio 1.59), negative margins, 4 LN removed and negative (0/4)   07/17/2014 Oncotype testing Score: 17 (11% ROR)   07/17/2014 Pathologic Stage Stage IA: pT1c pN0   09/05/2014 - 10/15/2014 Radiation Therapy Adjuvant RT Valere Dross): Right breast 45 Gy in 25 sessions , right breast tumor bed boost 5.4 Gy in 3 sessions   11/06/2014 -  Anti-estrogen oral therapy Tamoxifen 20 mg daily    INTERVAL HISTORY:  Ms. Trouten presents to the [facility] today for our initial meeting to review her survivorship care plan detailing her treatment course for breast cancer, as well as monitoring long-term side effects of that treatment, education regarding health maintenance, screening, and overall wellness and health promotion.     Overall, Ms. Gendron reports feeling quite well since completing her radiation therapy  approximately one month ago. She reports that she never developed fatigue, working part time throughout radiation, and the changes overlying her skin along her right breast have improved. She will occasionally experience a cramping along her right breast which "grabs her" and disappears. She denies any other breast pain or change. She denies headache, cough, shortness of breath or bone pain.  She has a good appetite and denies any weight loss.  She began her tamoxifen approximately 4 weeks ago and so far is tolerating it without significant side effects. She denies any mass or nodule within either breast.   REVIEW OF SYSTEMS:  General: Denies fever, chills, or night sweats.  HEENT: Denies visual changes, hearing loss, mouth sores or difficulty swallowing. Cardiac: Denies palpitations, chest pain, and lower extremity edema.  Respiratory: Denies wheeze or dyspnea on exertion.  Breast: Denies any new nodularity, masses, tenderness, nipple changes, or nipple discharge.  GI: Denies abdominal pain, constipation, diarrhea, nausea, or vomiting.  GU: Denies dysuria, hematuria, vaginal bleeding, vaginal discharge, or vaginal dryness.  Musculoskeletal: Denies joint or bone pain.  Neuro: Denies recent fall or numbness / tingling in her extremities. Skin: Denies rash, pruritis, or open wounds.  Psych: Denies depression, anxiety, insomnia, or memory loss.   A 14-point review of systems was completed and was negative, except as noted above.   ONCOLOGY TREATMENT TEAM:  1. Surgeon:  Dr. Barry Dienes at [facility]  2. Medical Oncologist: Dr. Jana Hakim 3. Radiation Oncologist: Dr. Valere Dross    PAST MEDICAL/SURGICAL HISTORY:  Past Medical History  Diagnosis Date  . Elevated blood pressure reading without diagnosis of hypertension     pt  denies hypertension  . GERD (gastroesophageal reflux disease)   . Gastric ulcer   . Breast cancer of upper-outer quadrant of right female breast ([place]) 07/06/2014  .  Breast cancer ([place])   . Arthritis     hands and feet  . PONV (postoperative nausea and vomiting)     nausea only  . Colon polyps     adenomatous  . S/P radiation therapy Right breast 4500 cGy in 25 sessions , right breast tumor bed boost 540 cGy in 3 sessions     Right breast 4500 cGy in 25 sessions , right breast tumor bed boost 540 cGy in 3 sessions    Past Surgical History  Procedure Laterality Date  . Abdominal hysterectomy    . Cesarean section    . Hemorrhoid surgery    . Foot surgery      Left  . Tonsillectomy    . Breast biopsy      left x2; right x 1  . Colonoscopy    . Polypectomy    . Radioactive seed guided mastectomy with axillary sentinel lymph node biopsy Right 07/17/2014    Procedure: RADIOACTIVE SEED GUIDED PARTIAL MASTECTOMY WITH AXILLARY SENTINEL LYMPH NODE BIOPSY;  Surgeon: [name], MD;  Location: [facility];  Service: General;  Laterality: Right;     ALLERGIES:  Allergies  Allergen Reactions  . Morphine And Related Hives    hives  . Sulfonamide Derivatives Hives    REACTION: hives  . Ultram [Tramadol Hcl] Other (See Comments)    Pt reports she was very dizzy with this medication  . Penicillins Swelling and Rash    Swelling throat     CURRENT MEDICATIONS:  Current Outpatient Prescriptions on File Prior to Visit  Medication Sig Dispense Refill  . acetaminophen (TYLENOL) 500 MG tablet Take 1,000 mg by mouth every 6 (six) hours as needed.      [name] BIOTIN PO Take 1 tablet by mouth daily.    . calcium carbonate (OS-CAL) 600 MG TABS tablet Take 600 mg by mouth 2 (two) times daily with a meal.    . cholecalciferol (VITAMIN D) 1000 UNITS tablet Take 1,000 Units by mouth daily.    . cyanocobalamin 500 MCG tablet Take 500 mcg by mouth daily.    [name] esomeprazole (NEXIUM) 40 MG capsule Take 1 capsule (40 mg total) by mouth daily before breakfast. 30 capsule 6  . glucosamine-chondroitin 500-400 MG  tablet Take 1 tablet by mouth 2 (two) times daily.    . Multiple Vitamin (MULTIVITAMIN) tablet Take 1 tablet by mouth daily.    . tamoxifen (NOLVADEX) 20 MG tablet Take 1 tablet (20 mg total) by mouth daily. 90 tablet 4  . venlafaxine XR (EFFEXOR-XR) 37.5 MG 24 hr capsule Take 1 capsule (37.5 mg total) by mouth daily with breakfast. 90 capsule 3  . vitamin C (ASCORBIC ACID) 500 MG tablet Take 500 mg by mouth daily.    . fluticasone (FLONASE) 50 MCG/ACT nasal spray Place 2 sprays into both nostrils daily.  11  . Na Sulfate-K Sulfate-Mg Sulf SOLN Suprep-Use as directed (Patient not taking: Reported on 12/04/2014) 354 mL 0   No current facility-administered medications on file prior to visit.     ONCOLOGIC FAMILY HISTORY:  Family History  Problem Relation Age of Onset  . Colon cancer Father 65    and dx. again at 56  . Stomach cancer Maternal Aunt 60  . Diabetes Mother   . Heart disease Mother   .  Breast cancer Paternal Aunt     dx. 48s  . Breast cancer Maternal Grandmother 82  . Skin cancer Brother     not melanoma  . Colon polyps Brother     1 polyp on colonoscopy  . Breast cancer Maternal Aunt 52  . Lung cancer Maternal Uncle     smoker  . Lung cancer Maternal Uncle     smoker  . Brain cancer Maternal Uncle     tumor type unknown  . Breast cancer Cousin     underwent BL mastectomies  . Leukemia Cousin 80     GENETIC COUNSELING/TESTING: Yes, performed 07/17/2014 revealing no clinically significant variant at ATM, BARD1, BRCA1, BRCA2, BRIP1, CDH1, CHEK2, MLH1, MRE11A, MSH2, MSH6, MUTYH, NBN, NF1, PALB2, PMS2, PTEN, RAD50, RAD51C, RAD51D, SMARCA4, STK11, and TP53.   SOCIAL HISTORY:  WAVER DIBIASIO is married and lives with her spouse in [place], [place].  She has 2 children. 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  She denies any current or history of tobacco or illicit drug use, and uses alcohol socially.[name]     PHYSICAL  EXAMINATION:  Vital Signs: Filed Vitals:   12/04/14 1600  BP: 134/66  Pulse: 85  Temp: 98.1 F (36.7 C)   ECOG Performance Status: 0  General: Well-nourished, well-appearing female in no acute distress.  She is unaccompanied in clinic today.   HEENT: Head is atraumatic and normocephalic.  Pupils equal and reactive to light and accomodation. Conjunctivae clear without exudate.  Sclerae anicteric. Oral mucosa is pink, moist, and intact without lesions.  Oropharynx is pink without lesions or erythema.  Lymph: No cervical, supraclavicular, infraclavicular, or axillary lymphadenopathy noted on palpation.  Cardiovascular: Regular rate and rhythm without murmurs, rubs, or gallops. Respiratory: Clear to auscultation bilaterally. Chest expansion symmetric without accessory muscle use on inspiration or expiration.  GI: Abdomen soft and round. No tenderness to palpation. Bowel sounds normoactive in 4 quadrants.  GU: Deferred.  Musculoskeletal: Muscle strength 5/5 in all extremities.   Neuro: No focal deficits. Steady gait.  Psych: Mood and affect normal and appropriate for situation.  Extremities: No edema, cyanosis, or clubbing.  Skin: Warm and dry. No open lesions noted.   LABORATORY DATA:  None for this visit.  DIAGNOSTIC IMAGING:  None for this visit.     ASSESSMENT AND PLAN:   1. History of breast cancer: Stage IA invasive ductal carcinoma of the right breast, low grade DCIS, ER positive, PR positive, HER2/neu negative, S/P lumpectomy followed by adjuvant radiation therapy, now on adjuvant endocrine therapy with tamoxifen.  Ms. Gergely is doing well without clinical symptoms worrisome for disease recurrence. She will follow-up with her medical oncologist, Dr. Jana Hakim, in February 2017 with history and physical examination per surveillance protocol.  She will continue her anti-estrogen therapy with tamoxifen as prescribed by Dr. Jana Hakim at this time. She was instructed to make Dr. Jana Hakim  or myself aware if she begins to experience any new or increased side effects of the medication and I could see her back in clinic to help manage those side effects, as needed. A comprehensive survivorship care plan and treatment summary was reviewed with the patient today detailing her breast cancer diagnosis, treatment course, potential late/long-term effects of treatment, appropriate follow-up care with recommendations for the future, and patient education resources.  A copy of this summary, along with a letter will be sent to the patient's primary care provider via in basket  message after today's visit.  Ms. Battin is welcome to return to the Survivorship Clinic in the future, as needed; no follow-up will be scheduled at this time.    2. Cancer screening:  Due to Ms. Ellingwood's history and her age, she should receive screening for skin cancers and colon cancer. She is SP hysterectomy.  The information and recommendations are listed on the patient's comprehensive care plan/treatment summary and were reviewed in detail with the patient.    3. Health maintenance and wellness promotion: Ms. Dulski was encouraged to consume 5-7 servings of fruits and vegetables per day. We reviewed the "Nutrition Rainbow" handout, as well as discussed recommendations to maximize nutrition and minimize recurrence, such as increased intake of fruits, vegetables, lean proteins, and minimizing the intake of red meats and processed foods.  She was also encouraged to engage in moderate to vigorous exercise for 30 minutes per day most days of the week. We discussed the [employer] fitness program, which is designed for cancer survivors to help them become more physically fit after cancer treatments.  She was instructed to limit her alcohol consumption and continue to abstain from tobacco use.  A copy of the "Take Control of Your Health" brochure was given to her reinforcing these recommendations.   4. Support services/counseling:  It is not uncommon for this period of the patient's cancer care trajectory to be one of many emotions and stressors.  We discussed an opportunity for her to participate in the next session of [facility] ("Finding Your New Normal") support group series designed for patients after they have completed treatment.   Ms. Rickerson was encouraged to take advantage of our many other support services programs, support groups, and/or counseling in coping with her new life as a cancer survivor after completing anti-cancer treatment.  She was offered support today through active listening and expressive supportive counseling.  She was given information regarding our available services and encouraged to contact me with any questions or for help enrolling in any of our support group/programs.    A total of 50 minutes of face-to-face time was spent with this patient with greater than 50% of that time in counseling and care-coordination.   Sylvan Cheese, NP  Survivorship Program [place] [phone]   Note: PRIMARY CARE PROVIDER No PCP Per Patient None None

## 2014-12-04 NOTE — Progress Notes (Signed)
CC: Dr. [name]  Follow-up note:  Marie Pierce returns today approximately 7 weeks following completion of radiation therapy following conservative surgery in the management of her T1b N0 invasive ductal carcinoma the right breast.  She is doing well and she is pleased with her progress and cosmesis.  She was started on tamoxifen by Dr. Jana Hakim approximately 1 month ago.  She does admit to mild hot flashes.  She is on Effexor.  She tells me she will see Dr. Jana Hakim for a follow-up visit in early February.  She does not yet have an appointment to see Dr. Barry Dienes.  Physical examination: Alert and oriented. Filed Vitals:   12/04/14 1512  BP: 134/66  Pulse: 85  Temp: 98.1 F (36.7 C)   Nodes: There is no palpable cervical, supraclavicular, or axillary lymphadenopathy.  Breasts: There is residual erythema along the right breast with minimal thickening.  Cosmesis is excellent.  No masses are appreciated.  Left breast without masses or lesions although there is a very superficial skin carcinoma along the upper inner quadrant of her right breast or an actinic keratosis measuring just approximately 7 mm in size.  Extremities: Without edema.  Impression: Satisfactory progress.  She'll maintain close follow-up with Dr. Jana Hakim.  I feel that she should have mammography by June 2017.  She will visit her dermatologist for evaluation of her skin in the near future.  No scheduled follow-up with [place].  Plan: As above.

## 2014-12-19 ENCOUNTER — Encounter: Payer: Self-pay | Admitting: Internal Medicine

## 2015-01-02 ENCOUNTER — Ambulatory Visit (AMBULATORY_SURGERY_CENTER): Payer: 59 | Admitting: Internal Medicine

## 2015-01-02 ENCOUNTER — Encounter: Payer: Self-pay | Admitting: Internal Medicine

## 2015-01-02 VITALS — BP 159/95 | HR 68 | Temp 96.0°F | Resp 16 | Ht 65.0 in | Wt 166.0 lb

## 2015-01-02 DIAGNOSIS — Z86010 Personal history of colonic polyps: Secondary | ICD-10-CM | POA: Diagnosis present

## 2015-01-02 DIAGNOSIS — D125 Benign neoplasm of sigmoid colon: Secondary | ICD-10-CM

## 2015-01-02 DIAGNOSIS — Z800 Family history of malignant neoplasm of digestive organs: Secondary | ICD-10-CM | POA: Diagnosis not present

## 2015-01-02 HISTORY — PX: COLONOSCOPY WITH PROPOFOL: SHX5780

## 2015-01-02 MED ORDER — SODIUM CHLORIDE 0.9 % IV SOLN: 500.0000 mL | INTRAVENOUS | Status: DC

## 2015-01-02 NOTE — Patient Instructions (Signed)
YOU HAD AN ENDOSCOPIC PROCEDURE TODAY AT [facility]:   Refer to the procedure report that was given to you for any specific questions about what was found during the examination.  If the procedure report does not answer your questions, please call your gastroenterologist to clarify.  If you requested that your care partner not be given the details of your procedure findings, then the procedure report has been included in a sealed envelope for you to review at your convenience later.  YOU SHOULD EXPECT: Some feelings of bloating in the abdomen. Passage of more gas than usual.  Walking can help get rid of the air that was put into your GI tract during the procedure and reduce the bloating. If you had a lower endoscopy (such as a colonoscopy or flexible sigmoidoscopy) you may notice spotting of blood in your stool or on the toilet paper. If you underwent a bowel prep for your procedure, you may not have a normal bowel movement for a few days.  Please Note:  You might notice some irritation and congestion in your nose or some drainage.  This is from the oxygen used during your procedure.  There is no need for concern and it should clear up in a day or so.  SYMPTOMS TO REPORT IMMEDIATELY:   Following lower endoscopy (colonoscopy or flexible sigmoidoscopy):  Excessive amounts of blood in the stool  Significant tenderness or worsening of abdominal pains  Swelling of the abdomen that is new, acute  Fever of 100F or higher    Black, tarry-looking stools  For urgent or emergent issues, a gastroenterologist can be reached at any hour by calling [phone].   DIET: Your first meal following the procedure should be a small meal and then it is ok to progress to your normal diet. Heavy or fried foods are harder to digest and may make you feel nauseous or bloated.  Likewise, meals heavy in dairy and vegetables can increase bloating.  Drink plenty of fluids but you should avoid alcoholic  beverages for 24 hours.  ACTIVITY:  You should plan to take it easy for the rest of today and you should NOT DRIVE or use heavy machinery until tomorrow (because of the sedation medicines used during the test).    FOLLOW UP: Our staff will call the number listed on your records the next business day following your procedure to check on you and address any questions or concerns that you may have regarding the information given to you following your procedure. If we do not reach you, we will leave a message.  However, if you are feeling well and you are not experiencing any problems, there is no need to return our call.  We will assume that you have returned to your regular daily activities without incident.  If any biopsies were taken you will be contacted by phone or by letter within the next 1-3 weeks.  Please call us at [phone] if you have not heard about the biopsies in 3 weeks.    SIGNATURES/CONFIDENTIALITY: You and/or your care partner have signed paperwork which will be entered into your electronic medical record.  These signatures attest to the fact that that the information above on your After Visit Summary has been reviewed and is understood.  Full responsibility of the confidentiality of this discharge information lies with you and/or your care-partner.   Information on polyps given to you today

## 2015-01-02 NOTE — Progress Notes (Signed)
Patient awakening,vss,report to rn 

## 2015-01-02 NOTE — Op Note (Signed)
[place]  [address] [place], [id number]   COLONOSCOPY PROCEDURE REPORT  PATIENT: Marie Pierce, Marie Pierce  MR#: [id number] BIRTHDATE: [birth year] , 21  yrs. old GENDER: female ENDOSCOPIST: Eustace Quail, MD REFERRED [id number] Program Recall PROCEDURE DATE:  01/02/2015 PROCEDURE:   Colonoscopy, surveillance and Colonoscopy with snare polypectomy x 1 First Screening Colonoscopy - Avg.  risk and is 50 yrs.  old or older - No.  Prior Negative Screening - Now for repeat screening. N/A  History of Adenoma - Now for follow-up colonoscopy & has been > or = to 3 yrs.  Yes hx of adenoma.  Has been 3 or more years since last colonoscopy.  Polyps removed today? Yes ASA CLASS:   Class II INDICATIONS:Surveillance due to prior colonic neoplasia, PH Colon Adenoma, and FH Colon or Rectal Adenocarcinoma. . Father. Prior examinations 2000 (8 mm tubular adenoma), 2003, 2006, 2008, in 2010 (negative) MEDICATIONS: Monitored anesthesia care and Propofol 400 mg IV  DESCRIPTION OF PROCEDURE:   After the risks benefits and alternatives of the procedure were thoroughly explained, informed consent was obtained.  The digital rectal exam revealed no abnormalities of the rectum.   The LB [id number]  endoscope was introduced through the anus and advanced to the cecum, which was identified by both the appendix and ileocecal valve. No adverse events experienced.   The quality of the prep was excellent. (Suprep was used)  The instrument was then slowly withdrawn as the colon was fully examined. Estimated blood loss is zero unless otherwise noted in this procedure report.  COLON FINDINGS: A single polyp measuring 2 mm in size was found in the sigmoid colon.  A polypectomy was performed with a cold snare. The resection was complete, the polyp tissue was completely retrieved and sent to histology.   The examination was otherwise normal.  Retroflexed views revealed no abnormalities. The  time to cecum = 3.3 Withdrawal time = 11.6   The scope was withdrawn and the procedure completed. COMPLICATIONS: There were no immediate complications.  ENDOSCOPIC IMPRESSION: 1.   Single polyp was found in the sigmoid colon; polypectomy was performed with a cold snare 2.   The examination was otherwise normal  RECOMMENDATIONS: 1. Follow up colonoscopy in 5 years  eSigned:  Eustace Quail, MD 01/02/2015 4:10 PM   cc: The Patient

## 2015-01-02 NOTE — Progress Notes (Signed)
Called to room to assist during endoscopic procedure.  Patient ID and intended procedure confirmed with present staff. Received instructions for my participation in the procedure from the performing physician.  

## 2015-01-03 ENCOUNTER — Telehealth: Payer: Self-pay | Admitting: Emergency Medicine

## 2015-01-03 NOTE — Telephone Encounter (Signed)
Left message, identifier present per f/u  

## 2015-01-08 ENCOUNTER — Encounter: Payer: Self-pay | Admitting: Internal Medicine

## 2015-02-06 ENCOUNTER — Other Ambulatory Visit: Payer: Self-pay

## 2015-02-06 DIAGNOSIS — C50411 Malignant neoplasm of upper-outer quadrant of right female breast: Secondary | ICD-10-CM

## 2015-02-07 ENCOUNTER — Other Ambulatory Visit (HOSPITAL_BASED_OUTPATIENT_CLINIC_OR_DEPARTMENT_OTHER): Payer: Managed Care, Other (non HMO)

## 2015-02-07 ENCOUNTER — Ambulatory Visit (HOSPITAL_BASED_OUTPATIENT_CLINIC_OR_DEPARTMENT_OTHER): Payer: Managed Care, Other (non HMO) | Admitting: Oncology

## 2015-02-07 ENCOUNTER — Telehealth: Payer: Self-pay | Admitting: Oncology

## 2015-02-07 VITALS — BP 137/66 | HR 78 | Temp 97.9°F | Resp 18 | Ht 65.0 in | Wt 167.9 lb

## 2015-02-07 DIAGNOSIS — Z170 Estrogen receptor positive status [ER+]: Secondary | ICD-10-CM

## 2015-02-07 DIAGNOSIS — C50411 Malignant neoplasm of upper-outer quadrant of right female breast: Secondary | ICD-10-CM | POA: Diagnosis not present

## 2015-02-07 DIAGNOSIS — N951 Menopausal and female climacteric states: Secondary | ICD-10-CM

## 2015-02-07 LAB — COMPREHENSIVE METABOLIC PANEL
ALBUMIN: 4 g/dL (ref 3.5–5.0)
ALK PHOS: 79 U/L (ref 40–150)
ALT: 22 U/L (ref 0–55)
AST: 20 U/L (ref 5–34)
Anion Gap: 11 mEq/L (ref 3–11)
BUN: 11.8 mg/dL (ref 7.0–26.0)
CALCIUM: 9.3 mg/dL (ref 8.4–10.4)
CO2: 26 mEq/L (ref 22–29)
CREATININE: 0.8 mg/dL (ref 0.6–1.1)
Chloride: 106 mEq/L (ref 98–109)
EGFR: 78 mL/min/{1.73_m2} — ABNORMAL LOW (ref >90)
GLUCOSE: 124 mg/dL (ref 70–140)
Potassium: 4.6 mEq/L (ref 3.5–5.1)
SODIUM: 143 meq/L (ref 136–145)
TOTAL PROTEIN: 6.7 g/dL (ref 6.4–8.3)

## 2015-02-07 LAB — CBC WITH DIFFERENTIAL/PLATELET
BASO%: 0.3 % (ref 0.0–2.0)
Basophils Absolute: 0 10*3/uL (ref 0.0–0.1)
EOS%: 1.7 % (ref 0.0–7.0)
Eosinophils Absolute: 0.1 10*3/uL (ref 0.0–0.5)
HEMATOCRIT: 40.8 % (ref 34.8–46.6)
HGB: 13.8 g/dL (ref 11.6–15.9)
LYMPH#: 1.4 10*3/uL (ref 0.9–3.3)
LYMPH%: 24.9 % (ref 14.0–49.7)
MCH: 32.9 pg (ref 25.1–34.0)
MCHC: 33.8 g/dL (ref 31.5–36.0)
MCV: 97.4 fL (ref 79.5–101.0)
MONO#: 0.3 10*3/uL (ref 0.1–0.9)
MONO%: 4.7 % (ref 0.0–14.0)
NEUT#: 3.9 10*3/uL (ref 1.5–6.5)
NEUT%: 68.4 % (ref 38.4–76.8)
Platelets: 169 10*3/uL (ref 145–400)
RBC: 4.19 10*6/uL (ref 3.70–5.45)
RDW: 12.8 % (ref 11.2–14.5)
WBC: 5.8 10*3/uL (ref 3.9–10.3)

## 2015-02-07 MED ORDER — GABAPENTIN 300 MG PO CAPS: 300.0000 mg | ORAL_CAPSULE | Freq: Every day | ORAL | Status: DC

## 2015-02-07 MED ORDER — TAMOXIFEN CITRATE 20 MG PO TABS: 20.0000 mg | ORAL_TABLET | Freq: Every day | ORAL | Status: DC

## 2015-02-07 NOTE — Progress Notes (Signed)
[facility]  [phone] [phone]     ID: Marie Pierce DOB: 10-13-1955  MR#: [id number]  QPR#:[id number]  Patient Care Team: No Pcp Per Patient as PCP - General (General Practice) Anastasio Auerbach, MD as Consulting Physician (Gynecology) [name], MD as Consulting Physician (General Surgery) Chauncey Cruel, MD as Consulting Physician (Oncology) Arloa Koh, MD as Consulting Physician (Radiation Oncology) Mauro Kaufmann, RN as Registered Nurse Rockwell Germany, RN as Registered Nurse Holley Bouche, NP as Nurse Practitioner (Nurse Practitioner) Sylvan Cheese, NP as Nurse Practitioner (Hematology and Oncology) PCP: No PCP Per Patient OTHER MD:  CHIEF COMPLAINT:  Estrogen receptor positive breast cancer  CURRENT TREATMENT:   tamoxifen   BREAST CANCER HISTORY:  from the original intake note:  "Marie Pierce" had bilateral screening mammography with tomography at the [facility] 06/18/2014. This showed a possible area of distortion in the right breast. On 06/25/2014 she was recalled for bilateral diagnostic mammography with tomography and right breast ultrasonography. The breast density was category C. In the right breast at 11:00 there was a persistent area of distortion which was not palpable by exam. Ultrasound confirmed a subtle hypoechoic mass in the area in question measuring 0.6 cm. Ultrasound of the right axilla was normal.  On 07/03/2014 the patient underwent right breast upper outer quadrant biopsy, with the pathology ([id number]) showing invasive ductal carcinoma, grade 1, estrogen receptor 100% positive, progesterone receptor 20% positive, both with strong staining intensity, with an MIB-1 of 10%, and HER-2 pending.  Her subsequent history is as detailed below.  INTERVAL HISTORY:  Marie Pierce returns today for follow-up of her early stage breast cancer. She has been on tamoxifen since November 2016. She is tolerating that well.  She does have more hot flashes although not as many as she did when she had her hysterectomy in her 75s. "Their bearable". She is taking venlafaxine at a very low dose and that seems to be helping some. She does have night sweats which wake her up 2 or 3 times a night. Vaginal wetness is not an issue. She is obtaining the drug at less than $2 a month.  REVIEW OF SYSTEMS: She is back to work full-time and enjoying it. She walks about 10,000 steps a day. She is exploring weight loss options including a program called "56 weeks" and she was interested in [place], which assists soy-based program. She does not feel particularly fatigued. She has some discomfort in her hips, which is transient and mild. Just a mild sore throat at present. She does have chronic low back pain which is not more intense or persistent than before. Otherwise a detailed review of systems was stable.  PAST MEDICAL HISTORY: Past Medical History  Diagnosis Date  . Elevated blood pressure reading without diagnosis of hypertension     pt denies hypertension  . GERD (gastroesophageal reflux disease)   . Gastric ulcer   . Breast cancer of upper-outer quadrant of right female breast ([facility]) 07/06/2014  . Breast cancer ([facility])   . Arthritis     hands and feet  . PONV (postoperative nausea and vomiting)     nausea only  . Colon polyps     adenomatous  . S/P radiation therapy Right breast 4500 cGy in 25 sessions , right breast tumor bed boost 540 cGy in 3 sessions     Right breast 4500 cGy in 25 sessions , right breast tumor bed boost 540 cGy in 3 sessions  PAST SURGICAL HISTORY: Past Surgical History  Procedure Laterality Date  . Abdominal hysterectomy    . Cesarean section    . Hemorrhoid surgery    . Foot surgery      Left  . Tonsillectomy    . Breast biopsy      left x2; right x 1  . Colonoscopy    . Polypectomy    . Radioactive seed guided mastectomy with axillary  sentinel lymph node biopsy Right 07/17/2014    Procedure: RADIOACTIVE SEED GUIDED PARTIAL MASTECTOMY WITH AXILLARY SENTINEL LYMPH NODE BIOPSY;  Surgeon: [name], MD;  Location: [facility];  Service: General;  Laterality: Right;    FAMILY HISTORY Family History  Problem Relation Age of Onset  . Colon cancer Father 4    and dx. again at 46  . Stomach cancer Maternal Aunt 60  . Diabetes Mother   . Heart disease Mother   . Breast cancer Paternal Aunt     dx. 80s  . Breast cancer Maternal Grandmother 70  . Skin cancer Brother     not melanoma  . Colon polyps Brother     1 polyp on colonoscopy  . Breast cancer Maternal Aunt 52  . Lung cancer Maternal Uncle     smoker  . Lung cancer Maternal Uncle     smoker  . Brain cancer Maternal Uncle     tumor type unknown  . Breast cancer Cousin     underwent BL mastectomies  . Leukemia Cousin 51   The patient's parents are still living, in their early 41s per the patient had 2 brothers, no sisters. The patient's father had colon cancer at age 98. One maternal aunt (out of 4) and the maternal grandmother were both diagnosed with breast cancer over the age of 94.  GYNECOLOGIC HISTORY:  No LMP recorded. Patient has had a hysterectomy.  menarche age 49, first live birth at 70, which increases the risk of breast cancer developing. The patient is GX P2. She is  Status post TAH/BSO and did not take hormone replacement. 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 Her husband Marie Pierce  Is a Chief Strategy Officer. Daughter Marie Pierce is a Pharmacist, hospital in [place] son Marie Pierce is studying at [employer] (Event organiser).    ADVANCED DIRECTIVES:  Not in place   HEALTH MAINTENANCE: Social History  Substance Use Topics  . Smoking status: Never Smoker   . Smokeless tobacco: Never Used  . Alcohol Use: 0.6 -  1.8 oz/week    1-3 Glasses of wine per week     Comment: occ     Colonoscopy: scheduled DEC 2016/ [facility]  PAP:  Bone density:  Lipid panel:  Allergies  Allergen Reactions  . Morphine And Related Hives    hives  . Sulfonamide Derivatives Hives    REACTION: hives  . Ultram [Tramadol Hcl] Other (See Comments)    Pt reports she was very dizzy with this medication  . Penicillins Swelling and Rash    Swelling throat    Current Outpatient Prescriptions  Medication Sig Dispense Refill  . acetaminophen (TYLENOL) 500 MG tablet Take 1,000 mg by mouth every 6 (six) hours as needed.      [name] BIOTIN PO Take 1 tablet by mouth daily.    . calcium carbonate (OS-CAL) 600 MG TABS tablet Take 600 mg by  mouth 2 (two) times daily with a meal.    . cholecalciferol (VITAMIN D) 1000 UNITS tablet Take 1,000 Units by mouth daily.    . cyanocobalamin 500 MCG tablet Take 500 mcg by mouth daily.    [name] esomeprazole (NEXIUM) 40 MG capsule Take 1 capsule (40 mg total) by mouth daily before breakfast. 30 capsule 6  . fluticasone (FLONASE) 50 MCG/ACT nasal spray Place 2 sprays into both nostrils daily.  11  . glucosamine-chondroitin 500-400 MG tablet Take 1 tablet by mouth 2 (two) times daily.    . Multiple Vitamin (MULTIVITAMIN) tablet Take 1 tablet by mouth daily.    . tamoxifen (NOLVADEX) 20 MG tablet Take 1 tablet (20 mg total) by mouth daily. 90 tablet 4  . venlafaxine XR (EFFEXOR-XR) 37.5 MG 24 hr capsule Take 1 capsule (37.5 mg total) by mouth daily with breakfast. 90 capsule 3  . vitamin C (ASCORBIC ACID) 500 MG tablet Take 500 mg by mouth daily.     No current facility-administered medications for this visit.    OBJECTIVE:  Middle-aged white woman who appears well Filed Vitals:   02/07/15 1400  BP: 137/66  Pulse: 78  Temp: 97.9 F (36.6 C)  Resp: 18     Body mass index is 27.94 kg/(m^2).    ECOG FS:0 - Asymptomatic  Sclerae unicteric, EOMs intact Oropharynx clear, dentition in good repair No  cervical or supraclavicular adenopathy Lungs no rales or rhonchi Heart regular rate and rhythm Abd soft, nontender, positive bowel sounds MSK no focal spinal tenderness, no upper extremity lymphedema Neuro: nonfocal, well oriented, appropriate affect Breasts: The right breast is status post lumpectomy and radiation. There is no evidence of local recurrence. The cosmetic result is excellent. The right axilla is benign per the left breast is unremarkable.   LAB RESULTS:  CMP     Component Value Date/Time   NA 142 11/06/2014 1321   NA 142 05/16/2010 1522   K 4.1 11/06/2014 1321   K 4.3 05/16/2010 1522   CL 103 05/16/2010 1522   CO2 28 11/06/2014 1321   CO2 30 05/16/2010 1522   GLUCOSE 121 11/06/2014 1321   GLUCOSE 86 05/16/2010 1522   BUN 9.1 11/06/2014 1321   BUN 9 05/16/2010 1522   CREATININE 0.7 11/06/2014 1321   CREATININE 0.8 05/16/2010 1522   CALCIUM 9.3 11/06/2014 1321   CALCIUM 9.4 05/16/2010 1522   PROT 6.8 11/06/2014 1321   PROT 6.8 05/16/2010 1522   ALBUMIN 4.0 11/06/2014 1321   ALBUMIN 4.3 05/16/2010 1522   AST 23 11/06/2014 1321   AST 31 05/16/2010 1522   ALT 29 11/06/2014 1321   ALT 44* 05/16/2010 1522   ALKPHOS 103 11/06/2014 1321   ALKPHOS 76 05/16/2010 1522   BILITOT <0.30 11/06/2014 1321   BILITOT 0.8 05/16/2010 1522    INo results found for: SPEP, UPEP  Lab Results  Component Value Date   WBC 5.8 02/07/2015   NEUTROABS 3.9 02/07/2015   HGB 13.8 02/07/2015   HCT 40.8 02/07/2015   MCV 97.4 02/07/2015   PLT 169 02/07/2015      Chemistry      Component Value Date/Time   NA 142 11/06/2014 1321   NA 142 05/16/2010 1522   K 4.1 11/06/2014 1321   K 4.3 05/16/2010 1522   CL 103 05/16/2010 1522   CO2 28 11/06/2014 1321   CO2 30 05/16/2010 1522   BUN 9.1 11/06/2014 1321   BUN 9 05/16/2010 1522   CREATININE  0.7 11/06/2014 1321   CREATININE 0.8 05/16/2010 1522      Component Value Date/Time   CALCIUM 9.3 11/06/2014 1321   CALCIUM 9.4  05/16/2010 1522   ALKPHOS 103 11/06/2014 1321   ALKPHOS 76 05/16/2010 1522   AST 23 11/06/2014 1321   AST 31 05/16/2010 1522   ALT 29 11/06/2014 1321   ALT 44* 05/16/2010 1522   BILITOT <0.30 11/06/2014 1321   BILITOT 0.8 05/16/2010 1522       No results found for: LABCA2  No components found for: LABCA125  No results for input(s): INR in the last 168 hours.  Urinalysis No results found for: COLORURINE, APPEARANCEUR, LABSPEC, PHURINE, GLUCOSEU, HGBUR, BILIRUBINUR, KETONESUR, PROTEINUR, UROBILINOGEN, NITRITE, LEUKOCYTESUR  STUDIES: No results found.  ASSESSMENT: 60 y.o. [place] woman status post right breast upper outer quadrant biopsy 07/03/2014 for a clinical T1b N0, stage IA invasive ductal carcinoma, grade 1, estrogen and progesterone receptor positive, with an MIB-1 of 10% and HER-2 pending.    (1) breast conserving surgery with sentinel lymph node sampling  07/17/2014 showed a pT1c pN0, stage IA  Invasive ductal carcinoma, grade 1, with ample margins. Repeat HER-2 was again negative  (2) Oncotype sent from the definitive surgical sample showed a recurrence score of 17, predicting a risk of recurrence outside the breast within 10 years of 11% if the patient's only systemic therapy is tamoxifen for 5 years. Also predicts no benefit from chemotherapy.  (3) adjuvant radiation 09/05/2014 through 10/15/2014:  Right breast 4500 cGy in 25 sessions , right breast tumor bed boost 540 cGy in 3 sessions   (4)  Started tamoxifen 11/06/2014  (a)  Patient is status post remote TAH-BSO  PLAN: Marie Pierce is tolerating tamoxifen well, with only hot flashes as the chief side effect. We discussed increasing the venlafaxine, but I think the more useful thing for her at this point would be to start gabapentin at bedtime. This will help her get through the night and should not cause any significant side effects then she would be taking an  extremely low dose, namely one fourth of the baseline dose. She understands this can make you sleepy and if she feels groggy in the morning she will let us know.  When the weather starts getting warmer in April and May if she feels the hot flashes are getting harder to deal with then she can simply double up on her venlafaxine to 75 mg. If that works for her we will change her dose to 75 mg.  I counseled her against the Medifast diet simply because I don't know how much so she would actually be consuming on a daily or monthly basis. If she can find out then we can discuss it further. A little sore here in there, eating tofu in a [employer] are using soy sauce occasionally would be of no concern. A more concentrated dose of so a, would be more of a question.  She is interested in a program called "56 weeks" which appears to be chiefly teaching people how to shop for fresh food, avoid additives such as corn syrup, salt etc., and if that is what it as it sounds like a worthwhile project. If they want to start selling her adjuvant products I would advise her to abstain.  In terms of dieting my suggestion to her is that she cut back on carbohydrates radically. If she does that she should at the very least not gain weight but probably over time lose between a  half a pound and a pound a month.  She is going to see me again in 6 months. She knows to call for any problems that may develop before that visit. Chauncey Cruel, MD   02/07/2015 2:14 PM Medical Oncology and Hematology [facility] [address] [place], [place] [address] Tel. [phone]    Fax. [phone]

## 2015-02-07 NOTE — Telephone Encounter (Signed)
Spoke with patient and she is aware of her 08/2015 appointments

## 2015-05-07 ENCOUNTER — Other Ambulatory Visit: Payer: Self-pay | Admitting: *Deleted

## 2015-05-07 MED ORDER — VENLAFAXINE HCL ER 37.5 MG PO CP24: 75.0000 mg | ORAL_CAPSULE | Freq: Every day | ORAL | Status: DC

## 2015-06-04 ENCOUNTER — Other Ambulatory Visit: Payer: Self-pay | Admitting: General Surgery

## 2015-06-04 DIAGNOSIS — Z853 Personal history of malignant neoplasm of breast: Secondary | ICD-10-CM

## 2015-06-27 ENCOUNTER — Ambulatory Visit
Admission: RE | Admit: 2015-06-27 | Discharge: 2015-06-27 | Disposition: A | Discharge status: Home / Self Care | Payer: 59 | Source: Ambulatory Visit | Attending: General Surgery | Admitting: General Surgery

## 2015-06-27 ENCOUNTER — Other Ambulatory Visit: Payer: Self-pay | Admitting: General Surgery

## 2015-06-27 DIAGNOSIS — Z853 Personal history of malignant neoplasm of breast: Secondary | ICD-10-CM

## 2015-06-28 ENCOUNTER — Ambulatory Visit
Admission: RE | Admit: 2015-06-28 | Discharge: 2015-06-28 | Disposition: A | Discharge status: Home / Self Care | Payer: 59 | Source: Ambulatory Visit | Attending: General Surgery | Admitting: General Surgery

## 2015-06-28 ENCOUNTER — Other Ambulatory Visit: Payer: Self-pay | Admitting: General Surgery

## 2015-06-28 DIAGNOSIS — Z853 Personal history of malignant neoplasm of breast: Secondary | ICD-10-CM

## 2015-08-13 ENCOUNTER — Ambulatory Visit (HOSPITAL_BASED_OUTPATIENT_CLINIC_OR_DEPARTMENT_OTHER): Payer: 59 | Admitting: Oncology

## 2015-08-13 ENCOUNTER — Other Ambulatory Visit (HOSPITAL_BASED_OUTPATIENT_CLINIC_OR_DEPARTMENT_OTHER): Payer: 59

## 2015-08-13 ENCOUNTER — Telehealth: Payer: Self-pay | Admitting: Oncology

## 2015-08-13 VITALS — BP 132/74 | HR 80 | Temp 98.8°F | Resp 18 | Ht 65.0 in | Wt 156.1 lb

## 2015-08-13 DIAGNOSIS — Z79810 Long term (current) use of selective estrogen receptor modulators (SERMs): Secondary | ICD-10-CM

## 2015-08-13 DIAGNOSIS — C50411 Malignant neoplasm of upper-outer quadrant of right female breast: Secondary | ICD-10-CM | POA: Diagnosis not present

## 2015-08-13 DIAGNOSIS — Z170 Estrogen receptor positive status [ER+]: Secondary | ICD-10-CM | POA: Diagnosis not present

## 2015-08-13 LAB — CBC WITH DIFFERENTIAL/PLATELET
BASO%: 0.5 % (ref 0.0–2.0)
Basophils Absolute: 0 10*3/uL (ref 0.0–0.1)
EOS%: 1.7 % (ref 0.0–7.0)
Eosinophils Absolute: 0.1 10*3/uL (ref 0.0–0.5)
HCT: 43.6 % (ref 34.8–46.6)
HEMOGLOBIN: 14.5 g/dL (ref 11.6–15.9)
LYMPH%: 25.9 % (ref 14.0–49.7)
MCH: 32.5 pg (ref 25.1–34.0)
MCHC: 33.3 g/dL (ref 31.5–36.0)
MCV: 97.4 fL (ref 79.5–101.0)
MONO#: 0.3 10*3/uL (ref 0.1–0.9)
MONO%: 6.9 % (ref 0.0–14.0)
NEUT%: 65 % (ref 38.4–76.8)
NEUTROS ABS: 3 10*3/uL (ref 1.5–6.5)
Platelets: 144 10*3/uL — ABNORMAL LOW (ref 145–400)
RBC: 4.48 10*6/uL (ref 3.70–5.45)
RDW: 12.7 % (ref 11.2–14.5)
WBC: 4.7 10*3/uL (ref 3.9–10.3)
lymph#: 1.2 10*3/uL (ref 0.9–3.3)

## 2015-08-13 LAB — COMPREHENSIVE METABOLIC PANEL
ALBUMIN: 3.7 g/dL (ref 3.5–5.0)
ALT: 60 U/L — ABNORMAL HIGH (ref 0–55)
AST: 34 U/L (ref 5–34)
Alkaline Phosphatase: 87 U/L (ref 40–150)
Anion Gap: 8 mEq/L (ref 3–11)
BUN: 8.3 mg/dL (ref 7.0–26.0)
CHLORIDE: 107 meq/L (ref 98–109)
CO2: 27 meq/L (ref 22–29)
CREATININE: 0.8 mg/dL (ref 0.6–1.1)
Calcium: 8.9 mg/dL (ref 8.4–10.4)
EGFR: 85 mL/min/{1.73_m2} — ABNORMAL LOW (ref >90)
Glucose: 128 mg/dl (ref 70–140)
Potassium: 3.9 mEq/L (ref 3.5–5.1)
SODIUM: 142 meq/L (ref 136–145)
TOTAL PROTEIN: 6.3 g/dL — AB (ref 6.4–8.3)
Total Bilirubin: 0.33 mg/dL (ref 0.20–1.20)

## 2015-08-13 MED ORDER — TAMOXIFEN CITRATE 20 MG PO TABS: 20.0000 mg | ORAL_TABLET | Freq: Every day | ORAL | 4 refills | Status: DC

## 2015-08-13 MED ORDER — VENLAFAXINE HCL ER 37.5 MG PO CP24: 75.0000 mg | ORAL_CAPSULE | Freq: Every day | ORAL | 3 refills | Status: DC

## 2015-08-13 NOTE — Progress Notes (Signed)
[place]  [phone] [phone]     ID: Marie Pierce DOB: July 03, 1955  MR#: [id number]  NLG#:[id number]  Patient Care Team: No Pcp Per Patient as PCP - General (General Practice) Anastasio Auerbach, MD as Consulting Physician (Gynecology) [name], MD as Consulting Physician (General Surgery) Chauncey Cruel, MD as Consulting Physician (Oncology) Arloa Koh, MD as Consulting Physician (Radiation Oncology) Mauro Kaufmann, RN as Registered Nurse Rockwell Germany, RN as Registered Nurse Holley Bouche, NP as Nurse Practitioner (Nurse Practitioner) Sylvan Cheese, NP as Nurse Practitioner (Hematology and Oncology) PCP: No PCP Per Patient OTHER MD:  CHIEF COMPLAINT:  Estrogen receptor positive breast cancer  CURRENT TREATMENT:   tamoxifen   BREAST CANCER HISTORY:  from the original intake note:  "Marie Pierce" had bilateral screening mammography with tomography at the [facility] 06/18/2014. This showed a possible area of distortion in the right breast. On 06/25/2014 she was recalled for bilateral diagnostic mammography with tomography and right breast ultrasonography. The breast density was category C. In the right breast at 11:00 there was a persistent area of distortion which was not palpable by exam. Ultrasound confirmed a subtle hypoechoic mass in the area in question measuring 0.6 cm. Ultrasound of the right axilla was normal.  On 07/03/2014 the patient underwent right breast upper outer quadrant biopsy, with the pathology ([id number]) showing invasive ductal carcinoma, grade 1, estrogen receptor 100% positive, progesterone receptor 20% positive, both with strong staining intensity, with an MIB-1 of 10%, and HER-2 pending.  Her subsequent history is as detailed below.  INTERVAL HISTORY: Marie Pierce returns today for follow-up of her estrogen receptor positive breast cancer. She had a scare this June with her mammogram, which showed a  slightly suspicious area. The discussed the six-month weight and we'll BUT she decided on biopsy I think appropriately. This fortunately showed only fat necrosis. ([id number])   She continues on tamoxifen, with excellent tolerance. Hot flashes and vaginal wetness are not major issues. She obtains a drug at a very good price.   REVIEW OF SYSTEMS: She is walking up 10,000 steps about for 5 days a week. 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 She doesn't some arthritis pain here and there but this is not more intense or persistent than before. She stopped the Neurontin because he was making her feel "stupid". She now is on a little bit of Effexor for hot flashes and that's all any a detailed review of systems today was otherwise stable.  PAST MEDICAL HISTORY: Past Medical History:  Diagnosis Date  . Arthritis    hands and feet  . Breast cancer ([place])   . Breast cancer of upper-outer quadrant of right female breast ([place]) 07/06/2014  . Colon polyps    adenomatous  . Elevated blood pressure reading without diagnosis of hypertension    pt denies hypertension  . Gastric ulcer   . GERD (gastroesophageal reflux disease)   . PONV (postoperative nausea and vomiting)    nausea only  . S/P radiation therapy Right breast 4500 cGy in 25 sessions , right breast tumor bed boost 540 cGy in 3 sessions    Right breast 4500 cGy in 25 sessions , right breast tumor bed boost 540 cGy in 3 sessions     PAST SURGICAL HISTORY: Past Surgical History:  Procedure Laterality Date  . ABDOMINAL HYSTERECTOMY    . BREAST BIOPSY     left x2; right x 1  .  CESAREAN SECTION    . COLONOSCOPY    . FOOT SURGERY     Left  . HEMORRHOID SURGERY    . POLYPECTOMY    . RADIOACTIVE SEED GUIDED MASTECTOMY WITH AXILLARY SENTINEL LYMPH NODE BIOPSY Right 07/17/2014   Procedure: RADIOACTIVE SEED GUIDED PARTIAL MASTECTOMY WITH AXILLARY SENTINEL LYMPH NODE BIOPSY;  Surgeon:  [name], MD;  Location: [facility];  Service: General;  Laterality: Right;  . TONSILLECTOMY      FAMILY HISTORY Family History  Problem Relation Age of Onset  . Colon cancer Father 66    and dx. again at 55  . Stomach cancer Maternal Aunt 60  . Diabetes Mother   . Heart disease Mother   . Breast cancer Paternal Aunt     dx. 40s  . Breast cancer Maternal Grandmother 59  . Skin cancer Brother     not melanoma  . Colon polyps Brother     1 polyp on colonoscopy  . Breast cancer Maternal Aunt 52  . Lung cancer Maternal Uncle     smoker  . Lung cancer Maternal Uncle     smoker  . Brain cancer Maternal Uncle     tumor type unknown  . Breast cancer Cousin     underwent BL mastectomies  . Leukemia Cousin 54   The patient's parents are still living, in their early 60s per the patient had 2 brothers, no sisters. The patient's father had colon cancer at age 65. One maternal aunt (out of 1) and the maternal grandmother were both diagnosed with breast cancer over the age of 32.  GYNECOLOGIC HISTORY:  No LMP recorded. Patient has had a hysterectomy.  menarche age 71, first live birth at 50, which increases the risk of breast cancer developing. The patient is GX P2. She is  Status post TAH/BSO and did not take hormone replacement. 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 Her husband Marie Pierce  Is a Chief Strategy Officer. Daughter Marie Pierce is a Pharmacist, hospital in [place] son Marie Pierce is studying at [employer] (Event organiser).    ADVANCED DIRECTIVES:  Not in place   HEALTH MAINTENANCE: Social History  Substance Use Topics  . Smoking status: Never Smoker  . Smokeless tobacco: Never Used  . Alcohol use 0.6 - 1.8 oz/week    1 - 3 Glasses of wine per week     Comment: occ     Colonoscopy: scheduled DEC 2016/ [facility]  PAP:  Bone  density:  Lipid panel:  Allergies  Allergen Reactions  . Morphine And Related Hives    hives  . Sulfonamide Derivatives Hives    REACTION: hives  . Ultram [Tramadol Hcl] Other (See Comments)    Pt reports she was very dizzy with this medication  . Penicillins Swelling and Rash    Swelling throat    Current Outpatient Prescriptions  Medication Sig Dispense Refill  . acetaminophen (TYLENOL) 500 MG tablet Take 1,000 mg by mouth every 6 (six) hours as needed.      [name] BIOTIN PO Take 1 tablet by mouth daily.    . calcium carbonate (OS-CAL) 600 MG TABS tablet Take 600 mg by mouth 2 (two) times daily with a meal.    . cholecalciferol (VITAMIN D) 1000 UNITS tablet Take 1,000 Units by mouth daily.    . cyanocobalamin 500 MCG tablet Take  500 mcg by mouth daily.    [name] esomeprazole (NEXIUM) 40 MG capsule Take 1 capsule (40 mg total) by mouth daily before breakfast. 30 capsule 6  . fluticasone (FLONASE) 50 MCG/ACT nasal spray Place 2 sprays into both nostrils daily.  11  . gabapentin (NEURONTIN) 300 MG capsule Take 1 capsule (300 mg total) by mouth at bedtime. 90 capsule 4  . glucosamine-chondroitin 500-400 MG tablet Take 1 tablet by mouth 2 (two) times daily.    . Multiple Vitamin (MULTIVITAMIN) tablet Take 1 tablet by mouth daily.    . tamoxifen (NOLVADEX) 20 MG tablet Take 1 tablet (20 mg total) by mouth daily. 90 tablet 4  . venlafaxine XR (EFFEXOR-XR) 37.5 MG 24 hr capsule Take 2 capsules (75 mg total) by mouth daily with breakfast. 180 capsule 3  . vitamin C (ASCORBIC ACID) 500 MG tablet Take 500 mg by mouth daily.     No current facility-administered medications for this visit.     OBJECTIVE:  Middle-aged white woman In no acute distress Vitals:   08/13/15 1412  BP: 132/74  Pulse: 80  Resp: 18  Temp: 98.8 F (37.1 C)     Body mass index is 25.98 kg/m.    ECOG FS:0 - Asymptomatic  Sclerae unicteric, pupils round and equal Oropharynx clear and moist-- no thrush or other lesions No  cervical or supraclavicular adenopathy Lungs no rales or rhonchi Heart regular rate and rhythm Abd soft, nontender, positive bowel sounds MSK no focal spinal tenderness, no upper extremity lymphedema Neuro: nonfocal, well oriented, appropriate affect Breasts: The right breast is status post lumpectomy and radiation. The cosmetic result is good. There is no evidence of disease recurrence. The right axilla is benign. The left breast is unremarkable.    LAB RESULTS:  CMP     Component Value Date/Time   NA 142 08/13/2015 1357   K 3.9 08/13/2015 1357   CL 103 05/16/2010 1522   CO2 27 08/13/2015 1357   GLUCOSE 128 08/13/2015 1357   BUN 8.3 08/13/2015 1357   CREATININE 0.8 08/13/2015 1357   CALCIUM 8.9 08/13/2015 1357   PROT 6.3 (L) 08/13/2015 1357   ALBUMIN 3.7 08/13/2015 1357   AST 34 08/13/2015 1357   ALT 60 (H) 08/13/2015 1357   ALKPHOS 87 08/13/2015 1357   BILITOT 0.33 08/13/2015 1357    INo results found for: SPEP, UPEP  Lab Results  Component Value Date   WBC 4.7 08/13/2015   NEUTROABS 3.0 08/13/2015   HGB 14.5 08/13/2015   HCT 43.6 08/13/2015   MCV 97.4 08/13/2015   PLT 144 (L) 08/13/2015      Chemistry      Component Value Date/Time   NA 142 08/13/2015 1357   K 3.9 08/13/2015 1357   CL 103 05/16/2010 1522   CO2 27 08/13/2015 1357   BUN 8.3 08/13/2015 1357   CREATININE 0.8 08/13/2015 1357      Component Value Date/Time   CALCIUM 8.9 08/13/2015 1357   ALKPHOS 87 08/13/2015 1357   AST 34 08/13/2015 1357   ALT 60 (H) 08/13/2015 1357   BILITOT 0.33 08/13/2015 1357       No results found for: LABCA2  No components found for: LABCA125  No results for input(s): INR in the last 168 hours.  Urinalysis No results found for: COLORURINE, APPEARANCEUR, LABSPEC, [place], GLUCOSEU, [place], BILIRUBINUR, KETONESUR, PROTEINUR, UROBILINOGEN, NITRITE, LEUKOCYTESUR  STUDIES: Authorizing: [name], MD  Addendum   ADDENDUM REPORT: 07/01/2015  13:31 ADDENDUM: Pathology revealed  FAT NECROSIS, PREVIOUS RESECTION SITE CHANGES of the anterior upper outer quadrant of the Right breast. This was found to be concordant by Dr. Hassan Rowan. Pathology results were discussed with the patient by telephone. The patient reported doing well after the biopsy with tenderness and minimal bleeding at the site. Post biopsy instructions and care were reviewed and questions were answered. The patient was encouraged to call [facility] for any additional concerns. The patient was instructed to return for annual diagnostic mammography and informed a reminder notice would be sent regarding this appointment. Pathology results reported by Terie Purser, RN on 07/01/2015. Electronically Signed   By: Margarette Canada M.D.   On: 07/01/2015 13:31  Addended by Margarette Canada, MD on 07/01/2015 2:16 PM    Study Result   CLINICAL DATA:  60 year old female for tissue sampling of new distortion within the anterior slightly upper outer right breast. History of right breast cancer and right lumpectomy more posteriorly in 2016.  EXAM: RIGHT BREAST STEREOTACTIC CORE NEEDLE BIOPSY  COMPARISON:  Previous exams.  FINDINGS: The patient and I discussed the procedure of stereotactic-guided biopsy including benefits and alternatives. We discussed the high likelihood of a successful procedure. We discussed the risks of the procedure including infection, bleeding, tissue injury, clip migration, and inadequate sampling. Informed written consent was given. The usual time out protocol was performed immediately prior to the procedure.  Using sterile technique and 1% Lidocaine as local anesthetic, under stereotactic/tomosynthesis guidance, a 9 gauge vacuum assisted needle device was used to perform core needle biopsy of distortion within the anterior slightly upper outer right breast using a superior approach.  At the conclusion of the procedure, a  coil shaped tissue marker clip was deployed into the biopsy cavity. Follow-up 2-view mammogram was performed and dictated separately.  IMPRESSION: Stereotactic-guided biopsy of distortion within the anterior slightly upper outer right breast. No apparent complications.  Pathology will be followed.  Electronically Signed: By: Margarette Canada M.D. On: 06/28/2015 08:23       ASSESSMENT: 60 y.o.  woman status post right breast upper outer quadrant biopsy 07/03/2014 for a clinical T1b N0, stage IA invasive ductal carcinoma, grade 1, estrogen and progesterone receptor positive, with an MIB-1 of 10% and HER-2 pending.    (1) breast conserving surgery with sentinel lymph node sampling  07/17/2014 showed a pT1c pN0, stage IA  Invasive ductal carcinoma, grade 1, with ample margins. Repeat HER-2 was again negative  (2) Oncotype sent from the definitive surgical sample showed a recurrence score of 17, predicting a risk of recurrence outside the breast within 10 years of 11% if the patient's only systemic therapy is tamoxifen for 5 years. Also predicts no benefit from chemotherapy.  (3) adjuvant radiation 09/05/2014 through 10/15/2014:  Right breast 4500 cGy in 25 sessions , right breast tumor bed boost 540 cGy in 3 sessions   (4)  Started tamoxifen 11/06/2014  (a)  Patient is status post remote TAH-BSO  PLAN: Marie Pierce is now a year out from definitive surgery for her breast cancer with no evidence of disease recurrence. This is favorable.  She is tolerating tamoxifen well and the plan will be to continue that at least an additional 2 years before considering switching to an aromatase inhibitor.  She is considering breast reconstruction but she wants to lose some weight first. That is a very reasonable way to approach that problem.  She does not have a primary care physician and would like me to check  her cholesterol and  thyroid on our yearly visits. I will be glad to do that.   Otherwise if she sees her surgeon Dr. Barry Dienes next February, she can return to see me a year from now. She knows to call for any problems that may develop before her next visit.  Chauncey Cruel, MD   08/13/2015 2:38 PM Medical Oncology and Hematology [facility] [address] [place], [place] [address] Tel. [phone]    Fax. [phone]

## 2015-08-13 NOTE — Telephone Encounter (Signed)
appt made and calendar mailed to pt 8/8 °

## 2015-08-31 ENCOUNTER — Other Ambulatory Visit: Payer: Self-pay | Admitting: Oncology

## 2015-11-04 ENCOUNTER — Other Ambulatory Visit: Payer: Self-pay | Admitting: Internal Medicine

## 2015-11-13 ENCOUNTER — Encounter (HOSPITAL_BASED_OUTPATIENT_CLINIC_OR_DEPARTMENT_OTHER): Payer: Self-pay | Admitting: *Deleted

## 2015-11-14 ENCOUNTER — Encounter (HOSPITAL_BASED_OUTPATIENT_CLINIC_OR_DEPARTMENT_OTHER)
Admission: RE | Admit: 2015-11-14 | Discharge: 2015-11-14 | Disposition: A | Discharge status: Home / Self Care | Payer: 59 | Source: Ambulatory Visit | Attending: Plastic Surgery | Admitting: Plastic Surgery

## 2015-11-14 DIAGNOSIS — Z853 Personal history of malignant neoplasm of breast: Secondary | ICD-10-CM | POA: Insufficient documentation

## 2015-11-14 DIAGNOSIS — Z01810 Encounter for preprocedural cardiovascular examination: Secondary | ICD-10-CM | POA: Insufficient documentation

## 2015-11-14 DIAGNOSIS — I10 Essential (primary) hypertension: Secondary | ICD-10-CM | POA: Diagnosis not present

## 2015-11-14 DIAGNOSIS — K219 Gastro-esophageal reflux disease without esophagitis: Secondary | ICD-10-CM | POA: Diagnosis not present

## 2015-11-14 DIAGNOSIS — Z923 Personal history of irradiation: Secondary | ICD-10-CM | POA: Diagnosis not present

## 2015-11-14 NOTE — H&P (Signed)
Subjective:     Patient ID: Marie Pierce is a 60 y.o. female.  HPI  Here for follow up discussion prior to planned breast reduction. Presented following screening MMG with distortion right breast. Ultimately had right lumpectomy with SLN and final pathology pT1c pN0, stage IA IDC, ER/PR +, Her 2 negative. On adjuvant tamoxifen. Completed adjuvant radiation 10/15/14  Most recent imaging screening MMG 06/2015, noted to have distortion UOQ and underwent biopsy demonstrating fat necrosis.   Reports she has always felt breasts too large for her and has had consultation for reduction in remote past. Complains of several year history neck and back pain. Denies numbness of hands. Notes she had had yeast rashes when using underwire bra that required prescription antifungal cream. Has tried muscle relaxants, ibuprofen and heat/ice packs for back pain without relief. Continues to see chiropractor for neck and shoulder pain, who has counseled her that not sure if breasts are source of her pain but that all of that provider's patients that have a reduction feel better.   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  Current 40 DD bra, using same bra post lumpectomy but reports right not filling bra and left too tight. Wt down 20 lbs through program "56 days" and goal additional 10 lbs. Stable wt since last visit here.  Review of Systems     Objective:   Physical Exam  Cardiovascular: Normal rate and regular rhythm.   Pulmonary/Chest: Effort normal and breath sounds normal.  Lymphadenopathy:    She has no axillary adenopathy.  Skin:  Fitzpatrick 2    Right< left volume, transverse scar right upper pole, radial scar UOQ left breast and left periareolar No masses but firm in area of lumpectomy scar   Sn to nipple R 28 L 31 cm BW R 18 L 18 cm Nipple to IMF R 12 L 14 cm + shoulder grooving, + kyphosis  Assessment:     History lumpectomy, radiation Breast  asymmetry acquired Macromastia Chronic neck and back pain    Plan:     Reviewed reduction with anchor type scars, drains. Plan OP surgery. Notes nausea with anesthesia and Rx for Zofran given today. She has hydrocodone at home and no additional Rx given. Diminished sensation nipple and breast skin, risk of nipple loss, wound healing problems, asymmetry, incidental carcinoma, changes with wt gain/loss, aging, unacceptable cosmetic appearance reviewed. Encouraged to complete wt loss prior to any surgery. Discussed that radiation history on right will increase her risks of wound healing problems. Discussed that this surgery does not increase or reduce risk cancer or recurrence, but can produce changes to imaging and exams such as she has experienced post lumpectomy- fat necrosis, firmness- and additional such as cysts and calcifications. We discussed I will send lumpectomy area as separate specimen and re clip cavity if this area is included in reduction.   Anticipate 385 g reduction from each breast.   Irene Limbo, MD [facility] [phone], pin [phone]

## 2015-11-14 NOTE — Progress Notes (Signed)
Pt given 8 oz carton of boost breeze with verbal and written instruction to drink by 5 am morning of surgery. Teach back and pt verbalized understanding

## 2015-11-19 ENCOUNTER — Inpatient Hospital Stay (HOSPITAL_BASED_OUTPATIENT_CLINIC_OR_DEPARTMENT_OTHER)
Admission: AD | Admit: 2015-11-19 | Discharge: 2015-11-21 | DRG: 287 | Disposition: A | Discharge status: Home / Self Care | Payer: 59 | Source: Ambulatory Visit | Attending: Cardiovascular Disease | Admitting: Cardiovascular Disease

## 2015-11-19 ENCOUNTER — Other Ambulatory Visit: Payer: Self-pay | Admitting: Cardiology

## 2015-11-19 ENCOUNTER — Encounter (HOSPITAL_COMMUNITY)
Admission: AD | Disposition: A | Discharge status: Home / Self Care | Payer: Self-pay | Source: Ambulatory Visit | Attending: Cardiovascular Disease

## 2015-11-19 ENCOUNTER — Encounter (HOSPITAL_BASED_OUTPATIENT_CLINIC_OR_DEPARTMENT_OTHER): Payer: Self-pay | Admitting: *Deleted

## 2015-11-19 ENCOUNTER — Ambulatory Visit (HOSPITAL_BASED_OUTPATIENT_CLINIC_OR_DEPARTMENT_OTHER): Payer: 59 | Admitting: Anesthesiology

## 2015-11-19 DIAGNOSIS — I4510 Unspecified right bundle-branch block: Secondary | ICD-10-CM | POA: Diagnosis present

## 2015-11-19 DIAGNOSIS — R748 Abnormal levels of other serum enzymes: Secondary | ICD-10-CM

## 2015-11-19 DIAGNOSIS — Z170 Estrogen receptor positive status [ER+]: Secondary | ICD-10-CM

## 2015-11-19 DIAGNOSIS — Z923 Personal history of irradiation: Secondary | ICD-10-CM

## 2015-11-19 DIAGNOSIS — R778 Other specified abnormalities of plasma proteins: Secondary | ICD-10-CM | POA: Diagnosis present

## 2015-11-19 DIAGNOSIS — Z86010 Personal history of colonic polyps: Secondary | ICD-10-CM

## 2015-11-19 DIAGNOSIS — R9431 Abnormal electrocardiogram [ECG] [EKG]: Secondary | ICD-10-CM

## 2015-11-19 DIAGNOSIS — M549 Dorsalgia, unspecified: Secondary | ICD-10-CM | POA: Diagnosis present

## 2015-11-19 DIAGNOSIS — I471 Supraventricular tachycardia, unspecified: Secondary | ICD-10-CM

## 2015-11-19 DIAGNOSIS — Z800 Family history of malignant neoplasm of digestive organs: Secondary | ICD-10-CM

## 2015-11-19 DIAGNOSIS — R9439 Abnormal result of other cardiovascular function study: Secondary | ICD-10-CM | POA: Diagnosis present

## 2015-11-19 DIAGNOSIS — R7989 Other specified abnormal findings of blood chemistry: Secondary | ICD-10-CM | POA: Diagnosis present

## 2015-11-19 DIAGNOSIS — M542 Cervicalgia: Secondary | ICD-10-CM | POA: Diagnosis present

## 2015-11-19 DIAGNOSIS — Z8249 Family history of ischemic heart disease and other diseases of the circulatory system: Secondary | ICD-10-CM

## 2015-11-19 DIAGNOSIS — G8929 Other chronic pain: Secondary | ICD-10-CM | POA: Diagnosis present

## 2015-11-19 DIAGNOSIS — Z79818 Long term (current) use of other agents affecting estrogen receptors and estrogen levels: Secondary | ICD-10-CM

## 2015-11-19 DIAGNOSIS — I499 Cardiac arrhythmia, unspecified: Secondary | ICD-10-CM | POA: Diagnosis not present

## 2015-11-19 DIAGNOSIS — Z853 Personal history of malignant neoplasm of breast: Secondary | ICD-10-CM

## 2015-11-19 DIAGNOSIS — N6489 Other specified disorders of breast: Secondary | ICD-10-CM | POA: Diagnosis present

## 2015-11-19 DIAGNOSIS — Z803 Family history of malignant neoplasm of breast: Secondary | ICD-10-CM

## 2015-11-19 DIAGNOSIS — Z808 Family history of malignant neoplasm of other organs or systems: Secondary | ICD-10-CM

## 2015-11-19 DIAGNOSIS — Z8711 Personal history of peptic ulcer disease: Secondary | ICD-10-CM

## 2015-11-19 DIAGNOSIS — Z5309 Procedure and treatment not carried out because of other contraindication: Secondary | ICD-10-CM | POA: Diagnosis present

## 2015-11-19 DIAGNOSIS — C50411 Malignant neoplasm of upper-outer quadrant of right female breast: Secondary | ICD-10-CM | POA: Diagnosis present

## 2015-11-19 DIAGNOSIS — N62 Hypertrophy of breast: Secondary | ICD-10-CM | POA: Diagnosis present

## 2015-11-19 DIAGNOSIS — Z833 Family history of diabetes mellitus: Secondary | ICD-10-CM

## 2015-11-19 LAB — COMPREHENSIVE METABOLIC PANEL
ALT: 45 U/L (ref 14–54)
ANION GAP: 9 (ref 5–15)
AST: 36 U/L (ref 15–41)
Albumin: 3.1 g/dL — ABNORMAL LOW (ref 3.5–5.0)
Alkaline Phosphatase: 64 U/L (ref 38–126)
BUN: 11 mg/dL (ref 6–20)
CHLORIDE: 106 mmol/L (ref 101–111)
CO2: 23 mmol/L (ref 22–32)
Calcium: 8.2 mg/dL — ABNORMAL LOW (ref 8.9–10.3)
Creatinine, Ser: 0.72 mg/dL (ref 0.44–1.00)
GFR calc Af Amer: >60 mL/min (ref >60)
Glucose, Bld: 100 mg/dL — ABNORMAL HIGH (ref 65–99)
POTASSIUM: 3.9 mmol/L (ref 3.5–5.1)
Sodium: 138 mmol/L (ref 135–145)
Total Bilirubin: 0.6 mg/dL (ref 0.3–1.2)
Total Protein: 4.8 g/dL — ABNORMAL LOW (ref 6.5–8.1)

## 2015-11-19 LAB — POCT I-STAT, CHEM 8
BUN: 11 mg/dL (ref 6–20)
CALCIUM ION: 1.1 mmol/L — AB (ref 1.15–1.40)
Chloride: 104 mmol/L (ref 101–111)
Creatinine, Ser: 0.7 mg/dL (ref 0.44–1.00)
GLUCOSE: 98 mg/dL (ref 65–99)
HCT: 38 % (ref 36.0–46.0)
HEMOGLOBIN: 12.9 g/dL (ref 12.0–15.0)
Potassium: 3.9 mmol/L (ref 3.5–5.1)
Sodium: 140 mmol/L (ref 135–145)
TCO2: 23 mmol/L (ref 0–100)

## 2015-11-19 LAB — TROPONIN I
TROPONIN I: 0.8 ng/mL — AB (ref <0.03)
TROPONIN I: 0.74 ng/mL — AB (ref <0.03)
Troponin I: 0.46 ng/mL (ref <0.03)

## 2015-11-19 LAB — CBC
HCT: 39.8 % (ref 36.0–46.0)
Hemoglobin: 13.1 g/dL (ref 12.0–15.0)
MCH: 31.8 pg (ref 26.0–34.0)
MCHC: 32.9 g/dL (ref 30.0–36.0)
MCV: 96.6 fL (ref 78.0–100.0)
PLATELETS: 128 10*3/uL — AB (ref 150–400)
RBC: 4.12 MIL/uL (ref 3.87–5.11)
RDW: 12.8 % (ref 11.5–15.5)
WBC: 4.4 10*3/uL (ref 4.0–10.5)

## 2015-11-19 LAB — I-STAT TROPONIN, ED: TROPONIN I, POC: 0.77 ng/mL — AB (ref 0.00–0.08)

## 2015-11-19 LAB — TSH: TSH: 1.008 u[IU]/mL (ref 0.350–4.500)

## 2015-11-19 SURGERY — CANCELLED PROCEDURE
Anesthesia: General | Laterality: Bilateral

## 2015-11-19 MED ORDER — GABAPENTIN 300 MG PO CAPS
300.0000 mg | ORAL_CAPSULE | ORAL | Status: AC
  Administered 2015-11-19: 300 mg via ORAL

## 2015-11-19 MED ORDER — MIDAZOLAM HCL 2 MG/2ML IJ SOLN
INTRAMUSCULAR | Status: AC
  Filled 2015-11-19: qty 2

## 2015-11-19 MED ORDER — CHLORHEXIDINE GLUCONATE CLOTH 2 % EX PADS: 6.0000 | MEDICATED_PAD | Freq: Once | CUTANEOUS | Status: DC

## 2015-11-19 MED ORDER — CELECOXIB 200 MG PO CAPS
ORAL_CAPSULE | ORAL | Status: AC
  Filled 2015-11-19: qty 1

## 2015-11-19 MED ORDER — TAMOXIFEN CITRATE 10 MG PO TABS
20.0000 mg | ORAL_TABLET | Freq: Every day | ORAL | Status: DC
  Administered 2015-11-19: 20 mg via ORAL
  Filled 2015-11-19 (×4): qty 2

## 2015-11-19 MED ORDER — CELECOXIB 400 MG PO CAPS
400.0000 mg | ORAL_CAPSULE | ORAL | Status: AC
  Administered 2015-11-19: 400 mg via ORAL

## 2015-11-19 MED ORDER — LIDOCAINE 2% (20 MG/ML) 5 ML SYRINGE
INTRAMUSCULAR | Status: AC
  Filled 2015-11-19: qty 5

## 2015-11-19 MED ORDER — DEXAMETHASONE SODIUM PHOSPHATE 10 MG/ML IJ SOLN
INTRAMUSCULAR | Status: AC
  Filled 2015-11-19: qty 1

## 2015-11-19 MED ORDER — LIDOCAINE 2% (20 MG/ML) 5 ML SYRINGE
INTRAMUSCULAR | Status: DC | PRN
  Administered 2015-11-19 (×2): 100 mg via INTRAVENOUS

## 2015-11-19 MED ORDER — ONDANSETRON HCL 4 MG/2ML IJ SOLN
INTRAMUSCULAR | Status: AC
  Filled 2015-11-19: qty 2

## 2015-11-19 MED ORDER — FENTANYL CITRATE (PF) 100 MCG/2ML IJ SOLN
INTRAMUSCULAR | Status: AC
  Filled 2015-11-19: qty 2

## 2015-11-19 MED ORDER — PROPOFOL 500 MG/50ML IV EMUL
INTRAVENOUS | Status: AC
  Filled 2015-11-19: qty 50

## 2015-11-19 MED ORDER — LACTATED RINGERS IV SOLN
INTRAVENOUS | Status: DC
  Administered 2015-11-19 (×3): via INTRAVENOUS

## 2015-11-19 MED ORDER — ATROPINE SULFATE 0.4 MG/ML IJ SOLN
INTRAMUSCULAR | Status: DC | PRN
  Administered 2015-11-19: 0.4 mg via INTRAVENOUS

## 2015-11-19 MED ORDER — GABAPENTIN 300 MG PO CAPS
ORAL_CAPSULE | ORAL | Status: AC
  Filled 2015-11-19: qty 1

## 2015-11-19 MED ORDER — CELECOXIB 200 MG PO CAPS
ORAL_CAPSULE | ORAL | Status: AC
  Filled 2015-11-19: qty 2

## 2015-11-19 MED ORDER — PROPOFOL 10 MG/ML IV BOLUS
INTRAVENOUS | Status: DC | PRN
  Administered 2015-11-19: 200 mg via INTRAVENOUS

## 2015-11-19 MED ORDER — SCOPOLAMINE 1 MG/3DAYS TD PT72: 1.0000 | MEDICATED_PATCH | Freq: Once | TRANSDERMAL | Status: DC | PRN

## 2015-11-19 MED ORDER — CLINDAMYCIN PHOSPHATE 900 MG/50ML IV SOLN
900.0000 mg | INTRAVENOUS | Status: AC
  Administered 2015-11-19: 400 mg via INTRAVENOUS

## 2015-11-19 MED ORDER — MIDAZOLAM HCL 2 MG/2ML IJ SOLN
1.0000 mg | INTRAMUSCULAR | Status: DC | PRN
  Administered 2015-11-19: 2 mg via INTRAVENOUS

## 2015-11-19 MED ORDER — SUCCINYLCHOLINE CHLORIDE 20 MG/ML IJ SOLN
INTRAMUSCULAR | Status: DC | PRN
  Administered 2015-11-19: 100 mg via INTRAVENOUS

## 2015-11-19 MED ORDER — ACETAMINOPHEN 500 MG PO TABS
ORAL_TABLET | ORAL | Status: AC
  Filled 2015-11-19: qty 2

## 2015-11-19 MED ORDER — ACETAMINOPHEN 500 MG PO TABS
1000.0000 mg | ORAL_TABLET | ORAL | Status: AC
  Administered 2015-11-19: 1000 mg via ORAL

## 2015-11-19 MED ORDER — EPINEPHRINE PF 1 MG/ML IJ SOLN
INTRAMUSCULAR | Status: DC | PRN
  Administered 2015-11-19: 1 mg via INTRAVENOUS

## 2015-11-19 MED ORDER — FENTANYL CITRATE (PF) 100 MCG/2ML IJ SOLN
50.0000 ug | INTRAMUSCULAR | Status: DC | PRN
  Administered 2015-11-19: 100 ug via INTRAVENOUS

## 2015-11-19 MED ORDER — CLINDAMYCIN PHOSPHATE 900 MG/50ML IV SOLN
INTRAVENOUS | Status: AC
  Filled 2015-11-19: qty 50

## 2015-11-19 SURGICAL SUPPLY — 45 items
APPLIER CLIP 9.375 MED OPEN (MISCELLANEOUS)
BINDER BREAST 3XL (BIND) IMPLANT
BINDER BREAST LRG (GAUZE/BANDAGES/DRESSINGS) IMPLANT
BINDER BREAST MEDIUM (GAUZE/BANDAGES/DRESSINGS) IMPLANT
BINDER BREAST XLRG (GAUZE/BANDAGES/DRESSINGS) IMPLANT
BINDER BREAST XXLRG (GAUZE/BANDAGES/DRESSINGS) IMPLANT
BLADE SURG 10 STRL SS (BLADE) ×12 IMPLANT
BNDG GAUZE ELAST 4 BULKY (GAUZE/BANDAGES/DRESSINGS) ×6 IMPLANT
CANISTER SUCT 1200ML W/VALVE (MISCELLANEOUS) ×3 IMPLANT
CHLORAPREP W/TINT 26ML (MISCELLANEOUS) ×3 IMPLANT
CLIP APPLIE 9.375 MED OPEN (MISCELLANEOUS) IMPLANT
DRAIN CHANNEL 15F RND FF W/TCR (WOUND CARE) IMPLANT
DRSG PAD ABDOMINAL 8X10 ST (GAUZE/BANDAGES/DRESSINGS) ×6 IMPLANT
ELECT COATED BLADE 2.86 ST (ELECTRODE) ×3 IMPLANT
ELECT REM PT RETURN 9FT ADLT (ELECTROSURGICAL) ×3
ELECTRODE REM PT RTRN 9FT ADLT (ELECTROSURGICAL) ×1 IMPLANT
EVACUATOR SILICONE 100CC (DRAIN) IMPLANT
GLOVE BIO SURGEON STRL SZ 6 (GLOVE) ×6 IMPLANT
GLOVE BIOGEL PI IND STRL 7.0 (GLOVE) ×2 IMPLANT
GLOVE BIOGEL PI INDICATOR 7.0 (GLOVE) ×4
GLOVE ECLIPSE 6.5 STRL STRAW (GLOVE) ×3 IMPLANT
GOWN STRL REUS W/ TWL LRG LVL3 (GOWN DISPOSABLE) ×2 IMPLANT
GOWN STRL REUS W/TWL LRG LVL3 (GOWN DISPOSABLE) ×4
LIQUID BAND (GAUZE/BANDAGES/DRESSINGS) ×3 IMPLANT
NS IRRIG 1000ML POUR BTL (IV SOLUTION) ×3 IMPLANT
PACK BASIN DAY SURGERY FS (CUSTOM PROCEDURE TRAY) ×3 IMPLANT
PACK UNIVERSAL I (CUSTOM PROCEDURE TRAY) ×3 IMPLANT
PENCIL BUTTON HOLSTER BLD 10FT (ELECTRODE) ×3 IMPLANT
PIN SAFETY STERILE (MISCELLANEOUS) ×3 IMPLANT
SHEET MEDIUM DRAPE 40X70 STRL (DRAPES) IMPLANT
SLEEVE SCD COMPRESS KNEE MED (MISCELLANEOUS) ×3 IMPLANT
SPONGE LAP 18X18 X RAY DECT (DISPOSABLE) ×12 IMPLANT
STAPLER VISISTAT 35W (STAPLE) ×6 IMPLANT
SUT ETHILON 2 0 FS 18 (SUTURE) IMPLANT
SUT MNCRL AB 4-0 PS2 18 (SUTURE) IMPLANT
SUT VIC AB 3-0 PS1 18 (SUTURE)
SUT VIC AB 3-0 PS1 18XBRD (SUTURE) IMPLANT
SUT VICRYL 4-0 PS2 18IN ABS (SUTURE) IMPLANT
SYR BULB IRRIGATION 50ML (SYRINGE) ×3 IMPLANT
TAPE MEASURE VINYL STERILE (MISCELLANEOUS) IMPLANT
TOWEL OR 17X24 6PK STRL BLUE (TOWEL DISPOSABLE) ×6 IMPLANT
TUBE CONNECTING 20'X1/4 (TUBING) ×1
TUBE CONNECTING 20X1/4 (TUBING) ×2 IMPLANT
UNDERPAD 30X30 (UNDERPADS AND DIAPERS) ×6 IMPLANT
YANKAUER SUCT BULB TIP NO VENT (SUCTIONS) ×3 IMPLANT

## 2015-11-19 NOTE — Progress Notes (Addendum)
CRITICAL VALUE ALERT  Critical value received: TROPONIN=0.80  Date of notification:  11/19/2015  Time of notification:  [id number]  Critical value read back:  YES  Nurse who received alert:  Nona Dell RN  MD notified (1st page):  BRITTAINY SIMMONS PA  Time of first page:  [phone]  MD notified (2nd page):  Time of second page:  Responding MD:  brittainy simmons, pa  Time MD responded:  17:19.Marland KitchenMarland KitchenMarland KitchenNo new orders recieved

## 2015-11-19 NOTE — Progress Notes (Signed)
Exercise Myoview arranged, per Dr. Burt Knack

## 2015-11-19 NOTE — ED Notes (Signed)
Cardiology at bedside.

## 2015-11-19 NOTE — ED Notes (Signed)
Attempted report to 3 W. 

## 2015-11-19 NOTE — ED Triage Notes (Signed)
Pt arrives from [facility] after having a 30 second run of [facility] followed by a rapid bigeminy after being intubated. Pt was extubated and [facility] was called to transfer pt to ED. Pt had 82mcg of epi, 0.4 atropine, 100mg  lidocane.  Trish with [facility] cardiology is supposed to follow up with patient.

## 2015-11-19 NOTE — Op Note (Signed)
Patient surgery aborted prior to surgical start secondary to arrhythmia following induction. Patient extubated, transferred to PACU in stable condition and plan transfer to [facility]. Daughter informed.

## 2015-11-19 NOTE — ED Provider Notes (Signed)
[facility] DEPT Provider Note   CSN: [id number] Arrival date & time: 11/19/15  [id number]     History   Chief Complaint Chief Complaint  Patient presents with  . Tachycardia    HPI Marie Pierce is a 60 y.o. female.  HPI Patient comes [place] from the day surgery center where she had a 30 second run of a wide-complex tachycardia.  At some point she was given 50 g of epi and 0.4 mg of atropine in the 100 mg of lidocaine.  They report that the rhythm came back to sinus rhythm.  Patient was extubated and was asymptomatic and brought to the ER for further evaluation.  Patient ports she has a history of what sounds like SVT as she has had a rapid heart rate in the past.  She states this usually occurs once a year and resolves on its own.  She has medication that she can take that was prescribed her by cardiologist with sounds like either a calcium channel blocker or beta blocker.  She reports that she had a Valsalva maneuver performed once before at an urgent care.  No prior history of cardiac disease.  No prior history of heart catheterization.  She does not smoke cigarettes.  No history of hypertension or hyperlipidemia or diabetes.  She does not have any chest discomfort at this time.  She has a history of breast cancer and was scheduled to undergo breast reduction surgery by plastic surgery today.   Past Medical History:  Diagnosis Date  . Arthritis    hands and feet  . Breast cancer ([place])   . Breast cancer of upper-outer quadrant of right female breast ([place]) 07/06/2014  . Colon polyps    adenomatous  . Elevated blood pressure reading without diagnosis of hypertension    pt denies hypertension  . Gastric ulcer   . GERD (gastroesophageal reflux disease)   . PAT (paroxysmal atrial tachycardia) ([place]) 2015  . PONV (postoperative nausea and vomiting)    nausea only  . S/P radiation therapy Right breast 4500 cGy in 25 sessions , right breast tumor bed boost 540 cGy in 3  sessions    Right breast 4500 cGy in 25 sessions , right breast tumor bed boost 540 cGy in 3 sessions     Patient Active Problem List   Diagnosis Date Noted  . Genetic testing 08/08/2014  . Family history of breast cancer in female 08/08/2014  . Family history of colon cancer 08/08/2014  . Breast cancer of upper-outer quadrant of right female breast ([place]) 07/06/2014  . Abdominal pain, epigastric 05/18/2010  . INCREASED BLOOD PRESSURE 11/28/2008    Past Surgical History:  Procedure Laterality Date  . ABDOMINAL HYSTERECTOMY    . BREAST BIOPSY     left x2; right x 1  . CESAREAN SECTION    . COLONOSCOPY    . FOOT SURGERY     Left  . HEMORRHOID SURGERY    . POLYPECTOMY    . RADIOACTIVE SEED GUIDED MASTECTOMY WITH AXILLARY SENTINEL LYMPH NODE BIOPSY Right 07/17/2014   Procedure: RADIOACTIVE SEED GUIDED PARTIAL MASTECTOMY WITH AXILLARY SENTINEL LYMPH NODE BIOPSY;  Surgeon: [name], MD;  Location: [facility];  Service: General;  Laterality: Right;  . TONSILLECTOMY      OB History    Gravida Para Term Preterm AB Living   2 2           SAB TAB Ectopic Multiple Live Births  Obstetric Comments   menarche age 22, first live birth at 42, which increases the risk of breast cancer developing. The patient is GX P2. She is postmenopausal and did not take hormone replacement. She took oral contraceptives for about 10 years remotely with no complication s           Home Medications    Prior to Admission medications   Medication Sig Start Date End Date Taking? Authorizing Provider  acetaminophen (TYLENOL) 500 MG tablet Take 1,000 mg by mouth every 6 (six) hours as needed.     Yes Historical Provider, MD  esomeprazole (NEXIUM) 40 MG capsule Take 1 capsule (40 mg total) by mouth daily before breakfast. 10/23/13  Yes Irene Shipper, MD  tamoxifen (NOLVADEX) 20 MG tablet Take 1 tablet (20 mg total) by mouth daily.  08/13/15  Yes Chauncey Cruel, MD  venlafaxine XR (EFFEXOR-XR) 37.5 MG 24 hr capsule Take 2 capsules (75 mg total) by mouth daily with breakfast. Patient taking differently: Take 37.5 mg by mouth daily with breakfast.  08/13/15  Yes Chauncey Cruel, MD    Family History Family History  Problem Relation Age of Onset  . Colon cancer Father 80    and dx. again at 72  . Stomach cancer Maternal Aunt 60  . Diabetes Mother   . Heart disease Mother   . Breast cancer Paternal Aunt     dx. 76s  . Breast cancer Maternal Grandmother 61  . Skin cancer Brother     not melanoma  . Colon polyps Brother     1 polyp on colonoscopy  . Breast cancer Maternal Aunt 52  . Lung cancer Maternal Uncle     smoker  . Lung cancer Maternal Uncle     smoker  . Brain cancer Maternal Uncle     tumor type unknown  . Breast cancer Cousin     underwent BL mastectomies  . Leukemia Cousin 47    Social History Social History  Substance Use Topics  . Smoking status: Never Smoker  . Smokeless tobacco: Never Used  . Alcohol use 0.6 - 1.8 oz/week    1 - 3 Glasses of wine per week     Comment: occ     Allergies   Morphine and related; Sulfonamide derivatives; Ultram [tramadol hcl]; and Penicillins   Review of Systems Review of Systems  All other systems reviewed and are negative.    Physical Exam Updated Vital Signs BP 129/73 (BP Location: Left Arm)   Pulse 90   Temp 98.7 F (37.1 C)   Resp 18   Ht 5\' 2"  (1.575 m)   Wt 156 lb (70.8 kg)   SpO2 100%   BMI 28.53 kg/m   Physical Exam  Constitutional: She is oriented to person, place, and time. She appears well-developed and well-nourished. No distress.  HENT:  Head: Normocephalic and atraumatic.  Eyes: EOM are normal.  Neck: Normal range of motion.  Cardiovascular: Normal rate, regular rhythm and normal heart sounds.   Pulmonary/Chest: Effort normal and breath sounds normal.  Abdominal: Soft. She exhibits no distension. There is no  tenderness.  Musculoskeletal: Normal range of motion.  Neurological: She is alert and oriented to person, place, and time.  Skin: Skin is warm and dry.  Psychiatric: She has a normal mood and affect. Judgment normal.  Nursing note and vitals reviewed.    ED Treatments / Results  Labs (all labs ordered are listed, but only abnormal results are displayed)  Labs Reviewed  CBC - Abnormal; Notable for the following:       Result Value   Platelets 128 (*)    All other components within normal limits  COMPREHENSIVE METABOLIC PANEL - Abnormal; Notable for the following:    Glucose, Bld 100 (*)    Calcium 8.2 (*)    Total Protein 4.8 (*)    Albumin 3.1 (*)    All other components within normal limits  POCT I-STAT, CHEM 8 - Abnormal; Notable for the following:    Calcium, Ion 1.10 (*)    All other components within normal limits  TSH  TROPONIN I    EKG  EKG Interpretation  Date/Time:  Tuesday November 19 2015 09:46:38 EST Ventricular Rate:  89 PR Interval:    QRS Duration: 86 QT Interval:  359 QTC Calculation: 437 R Axis:   43 Text Interpretation:  Sinus rhythm Low voltage, precordial leads No significant change was found Confirmed by Jihan Rudy  MD, Mora Pedraza ([id number]) on 11/19/2015 11:09:39 AM       Radiology No results found.  Procedures Procedures (including critical care time)  Medications Ordered in ED Medications  clindamycin (CLEOCIN) IVPB 900 mg (400 mg Intravenous Given 11/19/15 0737)  gabapentin (NEURONTIN) capsule 300 mg (300 mg Oral Given 11/19/15 0645)  acetaminophen (TYLENOL) tablet 1,000 mg (1,000 mg Oral Given 11/19/15 0645)  celecoxib (CELEBREX) 400 MG capsule 400 mg (400 mg Oral Given 11/19/15 0645)     Initial Impression / Assessment and Plan / ED Course  I have reviewed the triage vital signs and the nursing notes.  Pertinent labs & imaging results that were available during my care of the patient were reviewed by me and considered in my medical decision  making (see chart for details).  Clinical Course     Asymptomatic at this time.  Cardiology is artery aware that the patient is coming to the ER.  They will do it at the bedside.  May represent SVT with aberrancy versus other wide-complex tachycardia.  Patient will remain on the monitor here.  Standard workup without abnormality  Final Clinical Impressions(s) / ED Diagnoses   Final diagnoses:  Cardiac arrhythmia, unspecified cardiac arrhythmia type    New Prescriptions New Prescriptions   No medications on file     Jola Schmidt, MD 11/19/15 1113

## 2015-11-19 NOTE — Anesthesia Preprocedure Evaluation (Signed)
Anesthesia Evaluation  Patient identified by MRN, date of birth, ID band Patient awake    Reviewed: Allergy & Precautions, NPO status , Patient's Chart, lab work & pertinent test results  History of Anesthesia Complications (+) PONV  Airway Mallampati: II  TM Distance: >3 FB     Dental   Pulmonary    breath sounds clear to auscultation       Cardiovascular negative cardio ROS   Rhythm:Regular Rate:Normal     Neuro/Psych    GI/Hepatic Neg liver ROS, PUD, GERD  ,  Endo/Other    Renal/GU negative Renal ROS     Musculoskeletal  (+) Arthritis ,   Abdominal   Peds  Hematology   Anesthesia Other Findings   Reproductive/Obstetrics                             Anesthesia Physical Anesthesia Plan  ASA: II  Anesthesia Plan: General   Post-op Pain Management:    Induction: Intravenous  Airway Management Planned: Oral ETT  Additional Equipment:   Intra-op Plan:   Post-operative Plan: Extubation in OR  Informed Consent: I have reviewed the patients History and Physical, chart, labs and discussed the procedure including the risks, benefits and alternatives for the proposed anesthesia with the patient or authorized representative who has indicated his/her understanding and acceptance.   Dental advisory given  Plan Discussed with: CRNA and Anesthesiologist  Anesthesia Plan Comments:         Anesthesia Quick Evaluation

## 2015-11-19 NOTE — Transfer of Care (Signed)
Immediate Anesthesia Transfer of Care Note  Patient: Marie Pierce  Procedure(s) Performed: Procedure(s): BILATERAL MAMMARY REDUCTION  (BREAST) (Bilateral)  Patient Location: PACU  Anesthesia Type:General  Level of Consciousness: awake and alert   Airway & Oxygen Therapy: Patient Spontanous Breathing and Patient connected to face mask oxygen  Post-op Assessment: Report given to RN and Post -op Vital signs reviewed and stable  Post vital signs: Reviewed and stable  Last Vitals:  Vitals:   11/19/15 0812 11/19/15 0815  BP:  131/83  Pulse: (!) 112 (!) 109  Resp: 10 19  Temp:  37.1 C    Last Pain:  Vitals:   11/19/15 0641  TempSrc: Oral      Patients Stated Pain Goal: 0 ([id number])  Complications: No apparent anesthesia complications

## 2015-11-19 NOTE — Interval H&P Note (Signed)
History and Physical Interval Note:  11/19/2015 6:56 AM  Marie Pierce  has presented today for surgery, with the diagnosis of HISTORY OF RIGHT BREAST CANCER,HISTORY OF THERAPEUTIC RADIATION,ACQUIRED ASYMMETRY BREAST MACROMASTIA NECK PAIN AND CHRONIC BACK PAIN  The various methods of treatment have been discussed with the patient and family. After consideration of risks, benefits and other options for treatment, the patient has consented to  Procedure(s): BILATERAL MAMMARY REDUCTION  (BREAST) (Bilateral) as a surgical intervention .  The patient's history has been reviewed, patient examined, no change in status, stable for surgery.  I have reviewed the patient's chart and labs.  Questions were answered to the patient's satisfaction.     Rashaad Hallstrom

## 2015-11-19 NOTE — Consult Note (Signed)
Patient ID: Marie Pierce MRN: [id number], DOB/AGE: 08-30-1955   Admit date: 11/19/2015   Reason for Consult: Junctional Rhythm, RBBB Requesting MD: Dr. Venora Maples, ED   Primary Physician: Chauncey Cruel, MD Primary Cardiologist: New (seen in past by Dr. Caryl Comes)  Pt. Profile:  60 y/o female with a prior h/o PSVT/ AV nodal reentry, breast CA s/p right lumpectomy  + radiation therapy in 2016 and h/o HTN who presented for elective outpatient plastic surgery involving the right breast. After administration of anesthesia, patient reported developed rapid bigeminy/ ? Junctional rhythm? VT and surgery was aborted. Pt transferred [facility] ED. Cardiology consulted for recommendations.   Problem List  Past Medical History:  Diagnosis Date  . Arthritis    hands and feet  . Breast cancer ([place])   . Breast cancer of upper-outer quadrant of right female breast ([facility]) 07/06/2014  . Colon polyps    adenomatous  . Elevated blood pressure reading without diagnosis of hypertension    pt denies hypertension  . Gastric ulcer   . GERD (gastroesophageal reflux disease)   . PAT (paroxysmal atrial tachycardia) ([place]) 2015  . PONV (postoperative nausea and vomiting)    nausea only  . S/P radiation therapy Right breast 4500 cGy in 25 sessions , right breast tumor bed boost 540 cGy in 3 sessions    Right breast 4500 cGy in 25 sessions , right breast tumor bed boost 540 cGy in 3 sessions     Past Surgical History:  Procedure Laterality Date  . ABDOMINAL HYSTERECTOMY    . BREAST BIOPSY     left x2; right x 1  . CESAREAN SECTION    . COLONOSCOPY    . FOOT SURGERY     Left  . HEMORRHOID SURGERY    . POLYPECTOMY    . RADIOACTIVE SEED GUIDED MASTECTOMY WITH AXILLARY SENTINEL LYMPH NODE BIOPSY Right 07/17/2014   Procedure: RADIOACTIVE SEED GUIDED PARTIAL MASTECTOMY WITH AXILLARY SENTINEL LYMPH NODE BIOPSY;  Surgeon: [name], MD;  Location: [facility];  Service: General;  Laterality: Right;  . TONSILLECTOMY       Allergies  Allergies  Allergen Reactions  . Morphine And Related Hives    hives  . Sulfonamide Derivatives Hives    REACTION: hives  . Ultram [Tramadol Hcl] Other (See Comments)    Pt reports she was very dizzy with this medication  . Penicillins Swelling and Rash    Swelling throat    HPI  60 y/o female with a prior h/o PSVT/ AV nodal reentry, breast CA s/p right lumpectomy  + radiation therapy in 2016 and h/o HTN who presented to day surgery for elective plastic surgery involving the right breast. After administration of anesthesia, patient reportedly developed rapid bigeminy/ ? Junctional rhythm/ ? VT on telemetry and surgery was aborted. Patient was extubated and transferred to [facility] ED. Cardiology consulted for recommendations.   Unfortunately, her arrhthymia was not recorded on printable strips. Per anesthesia event note at 0744, "Different rhythm noted, asked OR RN to stop prepping pt. Noted several beats of VT and a bundle block on monitor then converted to very frequent PVC's, Bigeminy. Called Marchesi stat. Positive pulses throughout. Gave 50 mcg of Epi. And 0.2 Atopine. Started additional IV. [place] and Massage at bedside".    PACU note at [phone], "Case cancelled. Called earlier to room by CRNA April Carter for noted ECG changes thought PVCc and new bundle branch block. On immediate arrival carotid pulse present.  Replacement of leads still with conduction block noted. Epi 50 mcg, lidocaine 100mg , and Atropine 0.4mg  given. SR noted. Pt stable. Extubated without difficulty. [facility] called and agreed to accept once to ED. Patient stable in PACU".   In ED, patient is alert and stable. EKGs in the ED show NSR. No arrhthymias on telemetry. Pt asymptomatic. She denies any recent h/o CP, dyspnea, palpitations, dizziness, syncope/ near syncope.   Per records, she was seen in the past by Dr. Caryl Comes for  PSVT, treated with PRN BBs. She denies any recent history of this. She was last seen by Dr. Caryl Comes in 2010.  She had a 2D echo in 2007 that showed normal LVEF estimated to be 65 %. There was mild to moderate thickening of the mitral valve w/o stenosis or regurgitation. No other cardiac history.   In ED, CBC is unremarkable. WBC is WNL at 4.4. No anemia. Hgb is 13.1 BMP shows stable renal function. SCr is 0.72. BUN 11. K is WNL at 3.9. Calcium level is low at 8.2. TSH is WNL. Troponin is negative.   Home Medications  Prior to Admission medications   Medication Sig Start Date End Date Taking? Authorizing Provider  acetaminophen (TYLENOL) 500 MG tablet Take 1,000 mg by mouth every 6 (six) hours as needed.     Yes Historical Provider, MD  esomeprazole (NEXIUM) 40 MG capsule Take 1 capsule (40 mg total) by mouth daily before breakfast. 10/23/13  Yes Irene Shipper, MD  tamoxifen (NOLVADEX) 20 MG tablet Take 1 tablet (20 mg total) by mouth daily. 08/13/15  Yes Chauncey Cruel, MD  venlafaxine XR (EFFEXOR-XR) 37.5 MG 24 hr capsule Take 2 capsules (75 mg total) by mouth daily with breakfast. Patient taking differently: Take 37.5 mg by mouth daily with breakfast.  08/13/15  Yes Chauncey Cruel, MD    Family History  Family History  Problem Relation Age of Onset  . Colon cancer Father 51    and dx. again at 66  . Stomach cancer Maternal Aunt 60  . Diabetes Mother   . Heart disease Mother   . Breast cancer Paternal Aunt     dx. 45s  . Breast cancer Maternal Grandmother 67  . Skin cancer Brother     not melanoma  . Colon polyps Brother     1 polyp on colonoscopy  . Breast cancer Maternal Aunt 52  . Lung cancer Maternal Uncle     smoker  . Lung cancer Maternal Uncle     smoker  . Brain cancer Maternal Uncle     tumor type unknown  . Breast cancer Cousin     underwent BL mastectomies  . Leukemia Cousin 15    Social History  Social History   Social History  . Marital status: Married     Spouse name: N/A  . Number of children: N/A  . Years of education: N/A   Occupational History  . [employer]     OR Nurse    Social History Main Topics  . Smoking status: Never Smoker  . Smokeless tobacco: Never Used  . Alcohol use 0.6 - 1.8 oz/week    1 - 3 Glasses of wine per week     Comment: occ  . Drug use: No  . Sexual activity: Yes    Birth control/ protection: Surgical   Other Topics Concern  . Not on file   Social History Narrative  . No narrative on file  Review of Systems General:  No chills, fever, night sweats or weight changes.  Cardiovascular:  No chest pain, dyspnea on exertion, edema, orthopnea, palpitations, paroxysmal nocturnal dyspnea. Dermatological: No rash, lesions/masses Respiratory: No cough, dyspnea Urologic: No hematuria, dysuria Abdominal:   No nausea, vomiting, diarrhea, bright red blood per rectum, melena, or hematemesis Neurologic:  No visual changes, wkns, changes in mental status. All other systems reviewed and are otherwise negative except as noted above.  Physical Exam  Blood pressure 106/64, pulse 72, temperature 98.7 F (37.1 C), resp. rate 18, height 5\' 2"  (1.575 m), weight 156 lb (70.8 kg), SpO2 94 %.  General: Pleasant, NAD Psych: Normal affect. Neuro: Alert and oriented X 3. Moves all extremities spontaneously. HEENT: Normal  Neck: Supple without bruits or JVD. Lungs:  Resp regular and unlabored, CTA. Heart: RRR no s3, s4, or murmurs. Abdomen: Soft, non-tender, non-distended, BS + x 4.  Extremities: No clubbing, cyanosis or edema. DP/PT/Radials 2+ and equal bilaterally.  Labs  Troponin (Point of Care Test) No results for input(s): TROPIPOC in the last 72 hours.  Recent Labs  11/19/15 0949  TROPONINI <0.03   Lab Results  Component Value Date   WBC 4.4 11/19/2015   HGB 13.1 11/19/2015   HCT 39.8 11/19/2015   MCV 96.6 11/19/2015   PLT 128 (L) 11/19/2015    Recent Labs Lab 11/19/15 0949  NA 138   K 3.9  CL 106  CO2 23  BUN 11  CREATININE 0.72  CALCIUM 8.2*  PROT 4.8*  BILITOT 0.6  ALKPHOS 64  ALT 45  AST 36  GLUCOSE 100*   No results found for: CHOL, HDL, LDLCALC, TRIG No results found for: DDIMER   Radiology/Studies  No results found.  ECG #1: Baseline 11/14/2015: Normal sinus rhythm, within normal limits #2: 11/19/2015 0814: Sinus tachycardia with ST/T-wave change possible inferior and anterior ischemia #3:11/19/2015 0946: Normal sinus rhythm, within normal limits  ASSESSMENT AND PLAN 1. Arrhythmia: Patient with reported tachy arrhythmia at outpatient surgery center following administration of anesthesia. Surgery was aborted. Unfortunately, her arrhthymia was not recorded on printable strips. We have no visual documentation of what occurred. Since arrival to ED, rhythm has been stable. She did have 1 EKG with rate at 115 bpm with slight ST depressions. Subsequent EKGs following show NSR with normalization of ST segments. HR now normal. She is asymptomatic. She has mild hypocalcemia at 8.2 but normal K. TSH normal. No signs of infection nor anemia. Troponin negative x 1. I've discussed with Dr. Burt Knack. We do not feel that she will need admission to observation. Check a 2nd troponin. If negative, she can be released home from the ED. We will arrange an outpatient exercise nuclear study vs stress echo to r/o the presence of ischemia. If low risk study, she can be cleared for surgery again.    Signed, Lyda Jester, PA-C 11/19/2015, 12:35 PM   Patient seen, examined. Available data reviewed. Agree with findings, assessment, and plan as outlined by Lyda Jester, PA-C. The patient is independently interviewed and examined. On exam, she is alert and oriented in no distress. Carotids are normal with no bruits, lungs are clear, heart is regular rate and rhythm without murmur or gallop. Patient's abdomen is soft and nontender, positive bowel sounds, lower extremities without  edema.  EKG is reviewed as documented above. The patient developed cardiac arrhythmia during induction of anesthesia today. Her surgical case was canceled. There is documentation of PVCs, bigeminy, and bradycardia requiring atropine and epinephrine.  There are no rhythm strips available for review. The patient is otherwise physically active without exertional chest pain or pressure. She has no personal history of coronary atherosclerosis. Initially had planned on discharge home after cycling cardiac enzymes with an outpatient stress test to follow. However, her second set of cardiac biomarkers is positive with a troponin point of care of 0.77. Suspect she had a demand ischemic event. Recommend overnight observation, continue to cycle cardiac markers, and perform an exercise stress Myoview tomorrow for further risk stratification. If her stress test is abnormal would have a low threshold for cardiac catheterization. Will be able to definitively clear her for surgery after the results of her stress test are available. Would hold on IV heparin unless she has symptoms or any evidence of ongoing ischemia. Appears completely asymptomatic at present.   Sherren Mocha, M.D. 11/19/2015 2:45 PM

## 2015-11-19 NOTE — Anesthesia Procedure Notes (Signed)
Procedure Name: Intubation Date/Time: 11/19/2015 7:35 AM Performed by: Lieutenant Diego Pre-anesthesia Checklist: Patient identified, Emergency Drugs available, Suction available and Patient being monitored Patient Re-evaluated:Patient Re-evaluated prior to inductionOxygen Delivery Method: Circle system utilized Preoxygenation: Pre-oxygenation with 100% oxygen Intubation Type: IV induction Ventilation: Mask ventilation without difficulty Laryngoscope Size: Miller and 2 Grade View: Grade I Tube type: Oral Tube size: 7.0 mm Number of attempts: 1 Airway Equipment and Method: Stylet and Oral airway Placement Confirmation: ETT inserted through vocal cords under direct vision,  positive ETCO2 and breath sounds checked- equal and bilateral Secured at: 21 cm Tube secured with: Tape Dental Injury: Teeth and Oropharynx as per pre-operative assessment

## 2015-11-19 NOTE — ED Notes (Signed)
Walked patient to the bathroom pt did well

## 2015-11-20 ENCOUNTER — Observation Stay (HOSPITAL_COMMUNITY): Payer: 59

## 2015-11-20 DIAGNOSIS — Z8249 Family history of ischemic heart disease and other diseases of the circulatory system: Secondary | ICD-10-CM | POA: Diagnosis not present

## 2015-11-20 DIAGNOSIS — M542 Cervicalgia: Secondary | ICD-10-CM | POA: Diagnosis present

## 2015-11-20 DIAGNOSIS — R748 Abnormal levels of other serum enzymes: Secondary | ICD-10-CM | POA: Diagnosis present

## 2015-11-20 DIAGNOSIS — R9439 Abnormal result of other cardiovascular function study: Secondary | ICD-10-CM | POA: Diagnosis not present

## 2015-11-20 DIAGNOSIS — Z923 Personal history of irradiation: Secondary | ICD-10-CM | POA: Diagnosis not present

## 2015-11-20 DIAGNOSIS — M549 Dorsalgia, unspecified: Secondary | ICD-10-CM | POA: Diagnosis present

## 2015-11-20 DIAGNOSIS — Z800 Family history of malignant neoplasm of digestive organs: Secondary | ICD-10-CM | POA: Diagnosis not present

## 2015-11-20 DIAGNOSIS — Z803 Family history of malignant neoplasm of breast: Secondary | ICD-10-CM | POA: Diagnosis not present

## 2015-11-20 DIAGNOSIS — Z8711 Personal history of peptic ulcer disease: Secondary | ICD-10-CM | POA: Diagnosis not present

## 2015-11-20 DIAGNOSIS — N6489 Other specified disorders of breast: Secondary | ICD-10-CM | POA: Diagnosis present

## 2015-11-20 DIAGNOSIS — Z86010 Personal history of colonic polyps: Secondary | ICD-10-CM | POA: Diagnosis not present

## 2015-11-20 DIAGNOSIS — Z79818 Long term (current) use of other agents affecting estrogen receptors and estrogen levels: Secondary | ICD-10-CM | POA: Diagnosis not present

## 2015-11-20 DIAGNOSIS — N62 Hypertrophy of breast: Secondary | ICD-10-CM | POA: Diagnosis present

## 2015-11-20 DIAGNOSIS — I4510 Unspecified right bundle-branch block: Secondary | ICD-10-CM | POA: Diagnosis present

## 2015-11-20 DIAGNOSIS — Z5309 Procedure and treatment not carried out because of other contraindication: Secondary | ICD-10-CM | POA: Diagnosis present

## 2015-11-20 DIAGNOSIS — Z833 Family history of diabetes mellitus: Secondary | ICD-10-CM | POA: Diagnosis not present

## 2015-11-20 DIAGNOSIS — R778 Other specified abnormalities of plasma proteins: Secondary | ICD-10-CM | POA: Diagnosis present

## 2015-11-20 DIAGNOSIS — Z853 Personal history of malignant neoplasm of breast: Secondary | ICD-10-CM | POA: Diagnosis not present

## 2015-11-20 DIAGNOSIS — G8929 Other chronic pain: Secondary | ICD-10-CM | POA: Diagnosis present

## 2015-11-20 DIAGNOSIS — Z808 Family history of malignant neoplasm of other organs or systems: Secondary | ICD-10-CM | POA: Diagnosis not present

## 2015-11-20 DIAGNOSIS — I471 Supraventricular tachycardia: Secondary | ICD-10-CM | POA: Diagnosis present

## 2015-11-20 LAB — BASIC METABOLIC PANEL
Anion gap: 4 — ABNORMAL LOW (ref 5–15)
BUN: 10 mg/dL (ref 6–20)
CHLORIDE: 108 mmol/L (ref 101–111)
CO2: 28 mmol/L (ref 22–32)
CREATININE: 0.64 mg/dL (ref 0.44–1.00)
Calcium: 8.2 mg/dL — ABNORMAL LOW (ref 8.9–10.3)
GFR calc Af Amer: >60 mL/min (ref >60)
GFR calc non Af Amer: >60 mL/min (ref >60)
Glucose, Bld: 139 mg/dL — ABNORMAL HIGH (ref 65–99)
POTASSIUM: 4 mmol/L (ref 3.5–5.1)
SODIUM: 140 mmol/L (ref 135–145)

## 2015-11-20 LAB — CBC
HEMATOCRIT: 40.6 % (ref 36.0–46.0)
Hemoglobin: 13.5 g/dL (ref 12.0–15.0)
MCH: 32.2 pg (ref 26.0–34.0)
MCHC: 33.3 g/dL (ref 30.0–36.0)
MCV: 96.9 fL (ref 78.0–100.0)
Platelets: 143 10*3/uL — ABNORMAL LOW (ref 150–400)
RBC: 4.19 MIL/uL (ref 3.87–5.11)
RDW: 12.9 % (ref 11.5–15.5)
WBC: 5.6 10*3/uL (ref 4.0–10.5)

## 2015-11-20 LAB — NM MYOCAR MULTI W/SPECT W/WALL MOTION / EF
CSEPED: 5 min
CSEPEW: 1 METS
CSEPHR: 76 %
CSEPPHR: 123 {beats}/min
MPHR: 161 {beats}/min
Rest HR: 75 {beats}/min

## 2015-11-20 LAB — TROPONIN I: Troponin I: 0.22 ng/mL (ref <0.03)

## 2015-11-20 LAB — MAGNESIUM: Magnesium: 2.2 mg/dL (ref 1.7–2.4)

## 2015-11-20 MED ORDER — SODIUM CHLORIDE 0.9% FLUSH: 3.0000 mL | INTRAVENOUS | Status: DC | PRN

## 2015-11-20 MED ORDER — SODIUM CHLORIDE 0.9% FLUSH
3.0000 mL | Freq: Two times a day (BID) | INTRAVENOUS | Status: DC
Start: 2015-11-20 — End: 2015-11-21
  Administered 2015-11-20: 3 mL via INTRAVENOUS

## 2015-11-20 MED ORDER — TECHNETIUM TC 99M TETROFOSMIN IV KIT
10.0000 | PACK | Freq: Once | INTRAVENOUS | Status: AC | PRN
  Administered 2015-11-20: 10 via INTRAVENOUS

## 2015-11-20 MED ORDER — ASPIRIN 81 MG PO CHEW
81.0000 mg | CHEWABLE_TABLET | ORAL | Status: AC
  Administered 2015-11-21: 81 mg via ORAL
  Filled 2015-11-20: qty 1

## 2015-11-20 MED ORDER — REGADENOSON 0.4 MG/5ML IV SOLN
INTRAVENOUS | Status: AC
  Filled 2015-11-20: qty 5

## 2015-11-20 MED ORDER — SODIUM CHLORIDE 0.9 % WEIGHT BASED INFUSION
3.0000 mL/kg/h | INTRAVENOUS | Status: DC
  Administered 2015-11-21: 3 mL/kg/h via INTRAVENOUS

## 2015-11-20 MED ORDER — SODIUM CHLORIDE 0.9 % WEIGHT BASED INFUSION
1.0000 mL/kg/h | INTRAVENOUS | Status: DC
  Administered 2015-11-21: 1 mL/kg/h via INTRAVENOUS

## 2015-11-20 MED ORDER — TECHNETIUM TC 99M TETROFOSMIN IV KIT
30.0000 | PACK | Freq: Once | INTRAVENOUS | Status: AC | PRN
  Administered 2015-11-20: 30 via INTRAVENOUS

## 2015-11-20 MED ORDER — SODIUM CHLORIDE 0.9 % IV SOLN: 250.0000 mL | INTRAVENOUS | Status: DC | PRN

## 2015-11-20 MED ORDER — REGADENOSON 0.4 MG/5ML IV SOLN
0.4000 mg | Freq: Once | INTRAVENOUS | Status: AC
  Administered 2015-11-20: 0.4 mg via INTRAVENOUS
  Filled 2015-11-20: qty 5

## 2015-11-20 NOTE — Progress Notes (Signed)
Patient Name: Marie Pierce Date of Encounter: 11/20/2015  Primary Cardiologist: Dr. Tyrell Antonio Problem List     Active Problems:   Elevated troponin  Patient Profile     60 y/o female with a prior h/o PSVT/ AV nodal reentry, breast CA s/p right lumpectomy  + radiation therapy in 2016 and h/o HTN who presented for elective outpatient plastic surgery involving the right breast. After administration of anesthesia, patient reported developed rapid bigeminy/ ? Junctional rhythm? VT and surgery was aborted. Pt transferred to [facility] ED. Cardiology consulted for recommendations. Patient admitted to obs for abnormal troponin.   Subjective   No complaints. Denies CP, dyspnea and palpations.   Inpatient Medications    Scheduled Meds: . regadenoson      . tamoxifen  20 mg Oral Daily   Continuous Infusions:  PRN Meds:    Vital Signs    Vitals:   11/20/15 0548 11/20/15 0910 11/20/15 0934 11/20/15 0937  BP: 124/75 131/78 124/71 127/72  Pulse: 67     Resp:      Temp: 98.5 F (36.9 C)     TempSrc: Oral     SpO2: 99%     Weight: 155 lb 8 oz (70.5 kg)     Height:        Intake/Output Summary (Last 24 hours) at 11/20/15 0950 Last data filed at 11/20/15 0931  Gross per 24 hour  Intake              480 ml  Output                0 ml  Net              480 ml   Filed Weights   11/13/15 1129 11/19/15 0641 11/20/15 0548  Weight: 155 lb (70.3 kg) 156 lb (70.8 kg) 155 lb 8 oz (70.5 kg)    Physical Exam   GEN: Well nourished, well developed, in no acute distress.  HEENT: Grossly normal.  Neck: Supple, no JVD, carotid bruits, or masses. Cardiac: RRR, no murmurs, rubs, or gallops. No clubbing, cyanosis, edema.  Radials/DP/PT 2+ and equal bilaterally.  Respiratory:  Respirations regular and unlabored, clear to auscultation bilaterally. GI: Soft, nontender, nondistended, BS + x 4. MS: no deformity or atrophy. Skin: warm and dry, no rash. Neuro:  Strength and sensation are  intact. Psych: AAOx3.  Normal affect.  Labs    CBC  Recent Labs  11/19/15 0949 11/20/15 0236  WBC 4.4 5.6  HGB 13.1 13.5  HCT 39.8 40.6  MCV 96.6 96.9  PLT 128* [id number]*   Basic Metabolic Panel  Recent Labs  11/19/15 0949 11/20/15 0236  NA 138 140  K 3.9 4.0  CL 106 108  CO2 23 28  GLUCOSE 100* 139*  BUN 11 10  CREATININE 0.72 0.64  CALCIUM 8.2* 8.2*  MG  --  2.2   Liver Function Tests  Recent Labs  11/19/15 0949  AST 36  ALT 45  ALKPHOS 64  BILITOT 0.6  PROT 4.8*  ALBUMIN 3.1*   No results for input(s): LIPASE, AMYLASE in the last 72 hours. Cardiac Enzymes  Recent Labs  11/19/15 1815 11/19/15 2106 11/20/15 0236  TROPONINI 0.74* 0.46* 0.22*   BNP Invalid input(s): POCBNP D-Dimer No results for input(s): DDIMER in the last 72 hours. Hemoglobin A1C No results for input(s): HGBA1C in the last 72 hours. Fasting Lipid Panel No results for input(s): CHOL, HDL, LDLCALC, TRIG, CHOLHDL,  LDLDIRECT in the last 72 hours. Thyroid Function Tests  Recent Labs  11/19/15 1000  TSH 1.008    Telemetry    NSR - Personally Reviewed  ECG    NSR - Personally Reviewed  Radiology    No results found.  Cardiac Studies   NST- pending   Patient Profile     60 y/o female with a prior h/o PSVT/ AV nodal reentry, breast CA s/p right lumpectomy  + radiation therapy in 2016 and h/o HTN who presented for elective outpatient plastic surgery involving the right breast. After administration of anesthesia, patient reported developed rapid bigeminy/ ? Junctional rhythm? VT and surgery was aborted. Pt transferred to [facility] ED. Cardiology consulted for recommendations.   Assessment & Plan    1. Arrhythmia: Patient with reported tachy arrhythmia at outpatient surgery center following administration of anesthesia for plastic surgery involving the right breast. Surgery was aborted. Unfortunately, her arrhthymia was not recorded on printable strips. We have no visual  documentation of what occurred. EKG in nuc medicine shows NSR. She had no arrhthymias during stress test. MD to review overnight telemetry when she returns to the floor. She remained asymptomatic overnight.   2. Abnormal Troponin: 0.74>>0.46>>0.22. She denies CP. Baseline EKG prior to stress showed NSR with no ischemic abnormalities. She was noted to have flattening of [place] in V4-V6 during stress test, which all returned to baseline prior to test completion. Radiologist interpretation pending.   3. H/o Rt sided Breast CA: treated in 2016 with lumpectomy + radiation. Plans for reconstructive surgery of right breast currently on hold given events yesterday. Ischemic w/u pending.   Signed, Lyda Jester, PA-C  11/20/2015, 9:50 AM   I have personally seen and examined this patient with Lyda Jester, PA-C. I agree with the assessment and plan as outlined above. She is admitted after having reported bradycardia/junctional rhythm after anesthesia was induced yesterday for elective breast surgery. Troponin was mildly elevated on admission but she had no c/o chest pain. Rare PACs on tele overnight. No chest pain this am. Plan per admitting team for nuclear stress test today. If no ischemia noted,she can be discharged home. If stress test is abnormal, will need cardiac cath to exclude obstructive CAD.   Lauree Chandler 11/20/2015 10:30 AM

## 2015-11-20 NOTE — Progress Notes (Signed)
   Stress test returned "intermediate risk"  Study Result    Defect 1: There is a medium defect of moderate severity present in the mid anterior, apical anterior and apex location.  This is an intermediate risk study.  The left ventricular ejection fraction is mildly decreased (45-54%).   Results were reported to Dr. Angelena Form. He has recommended definative LHC. Result findings were reviewed with patient. I've discussed indication for procedure, along with procedural details and potential associated risk including risk of death, MI, stroke, bleeding complications, vascular injury, renal injury, loss of limb and allergic reaction to contrast dye. She understands these risk and agrees to proceed with cath. She will be made NPO at midnight. Will place on cath board.

## 2015-11-20 NOTE — Plan of Care (Signed)
Problem: Education: Goal: Knowledge of [facility] Education information/materials will improve Outcome: Progressing Patient aware of plan of care.  Patient has denied pain thus far this shift.

## 2015-11-21 ENCOUNTER — Encounter (HOSPITAL_COMMUNITY): Payer: Self-pay | Admitting: Anesthesiology

## 2015-11-21 ENCOUNTER — Encounter (HOSPITAL_COMMUNITY): Payer: Self-pay | Admitting: Cardiovascular Disease

## 2015-11-21 ENCOUNTER — Encounter (HOSPITAL_COMMUNITY)
Admission: AD | Disposition: A | Discharge status: Home / Self Care | Payer: Self-pay | Source: Ambulatory Visit | Attending: Cardiovascular Disease

## 2015-11-21 DIAGNOSIS — I471 Supraventricular tachycardia, unspecified: Secondary | ICD-10-CM

## 2015-11-21 DIAGNOSIS — R9439 Abnormal result of other cardiovascular function study: Secondary | ICD-10-CM

## 2015-11-21 HISTORY — PX: CARDIAC CATHETERIZATION: SHX172

## 2015-11-21 LAB — BASIC METABOLIC PANEL
ANION GAP: 6 (ref 5–15)
BUN: 10 mg/dL (ref 6–20)
CALCIUM: 8.6 mg/dL — AB (ref 8.9–10.3)
CO2: 26 mmol/L (ref 22–32)
Chloride: 109 mmol/L (ref 101–111)
Creatinine, Ser: 0.67 mg/dL (ref 0.44–1.00)
GFR calc Af Amer: >60 mL/min (ref >60)
GLUCOSE: 104 mg/dL — AB (ref 65–99)
Potassium: 4 mmol/L (ref 3.5–5.1)
SODIUM: 141 mmol/L (ref 135–145)

## 2015-11-21 LAB — CBC
HCT: 42 % (ref 36.0–46.0)
Hemoglobin: 14 g/dL (ref 12.0–15.0)
MCH: 32.4 pg (ref 26.0–34.0)
MCHC: 33.3 g/dL (ref 30.0–36.0)
MCV: 97.2 fL (ref 78.0–100.0)
PLATELETS: 150 10*3/uL (ref 150–400)
RBC: 4.32 MIL/uL (ref 3.87–5.11)
RDW: 13 % (ref 11.5–15.5)
WBC: 4.8 10*3/uL (ref 4.0–10.5)

## 2015-11-21 LAB — PROTIME-INR
INR: 0.96
PROTHROMBIN TIME: 12.8 s (ref 11.4–15.2)

## 2015-11-21 LAB — PLATELET INHIBITION P2Y12: PLATELET FUNCTION P2Y12: 267 [PRU] (ref 194–418)

## 2015-11-21 SURGERY — LEFT HEART CATH AND CORONARY ANGIOGRAPHY
Anesthesia: LOCAL

## 2015-11-21 MED ORDER — SODIUM CHLORIDE 0.9% FLUSH: 3.0000 mL | Freq: Two times a day (BID) | INTRAVENOUS | Status: DC

## 2015-11-21 MED ORDER — LIDOCAINE HCL (PF) 1 % IJ SOLN
INTRAMUSCULAR | Status: AC
  Filled 2015-11-21: qty 30

## 2015-11-21 MED ORDER — HEPARIN (PORCINE) IN NACL 2-0.9 UNIT/ML-% IJ SOLN
INTRAMUSCULAR | Status: DC | PRN
  Administered 2015-11-21: 1000 mL

## 2015-11-21 MED ORDER — MIDAZOLAM HCL 2 MG/2ML IJ SOLN
INTRAMUSCULAR | Status: AC
  Filled 2015-11-21: qty 2

## 2015-11-21 MED ORDER — HEPARIN (PORCINE) IN NACL 2-0.9 UNIT/ML-% IJ SOLN
INTRAMUSCULAR | Status: AC
  Filled 2015-11-21: qty 1000

## 2015-11-21 MED ORDER — HEPARIN SODIUM (PORCINE) 1000 UNIT/ML IJ SOLN
INTRAMUSCULAR | Status: AC
  Filled 2015-11-21: qty 1

## 2015-11-21 MED ORDER — FENTANYL CITRATE (PF) 100 MCG/2ML IJ SOLN: 25.0000 ug | INTRAMUSCULAR | Status: DC | PRN

## 2015-11-21 MED ORDER — SODIUM CHLORIDE 0.9 % WEIGHT BASED INFUSION: 3.0000 mL/kg/h | INTRAVENOUS | Status: AC

## 2015-11-21 MED ORDER — VERAPAMIL HCL 2.5 MG/ML IV SOLN
INTRAVENOUS | Status: AC
  Filled 2015-11-21: qty 2

## 2015-11-21 MED ORDER — ACETAMINOPHEN 325 MG PO TABS: 650.0000 mg | ORAL_TABLET | ORAL | Status: DC | PRN

## 2015-11-21 MED ORDER — SODIUM CHLORIDE 0.9% FLUSH: 3.0000 mL | INTRAVENOUS | Status: DC | PRN

## 2015-11-21 MED ORDER — SODIUM CHLORIDE 0.9 % IV SOLN: 250.0000 mL | INTRAVENOUS | Status: DC | PRN

## 2015-11-21 MED ORDER — ONDANSETRON HCL 4 MG/2ML IJ SOLN: 4.0000 mg | Freq: Four times a day (QID) | INTRAMUSCULAR | Status: DC | PRN

## 2015-11-21 MED ORDER — FENTANYL CITRATE (PF) 100 MCG/2ML IJ SOLN
INTRAMUSCULAR | Status: AC
  Filled 2015-11-21: qty 2

## 2015-11-21 MED ORDER — IOPAMIDOL (ISOVUE-370) INJECTION 76%
INTRAVENOUS | Status: AC
  Filled 2015-11-21: qty 100

## 2015-11-21 MED ORDER — FENTANYL CITRATE (PF) 100 MCG/2ML IJ SOLN
INTRAMUSCULAR | Status: DC | PRN
  Administered 2015-11-21: 25 ug via INTRAVENOUS
  Administered 2015-11-21: 50 ug via INTRAVENOUS

## 2015-11-21 MED ORDER — PROMETHAZINE HCL 25 MG/ML IJ SOLN: 6.2500 mg | INTRAMUSCULAR | Status: DC | PRN

## 2015-11-21 MED ORDER — DIAZEPAM 5 MG PO TABS: 5.0000 mg | ORAL_TABLET | Freq: Four times a day (QID) | ORAL | Status: DC | PRN

## 2015-11-21 MED ORDER — LIDOCAINE HCL (PF) 1 % IJ SOLN
INTRAMUSCULAR | Status: DC | PRN
  Administered 2015-11-21: 2 mL

## 2015-11-21 MED ORDER — IOPAMIDOL (ISOVUE-370) INJECTION 76%
INTRAVENOUS | Status: DC | PRN
  Administered 2015-11-21: 55 mL via INTRA_ARTERIAL

## 2015-11-21 MED ORDER — MIDAZOLAM HCL 2 MG/2ML IJ SOLN
INTRAMUSCULAR | Status: DC | PRN
Start: 2015-11-21 — End: 2015-11-21
  Administered 2015-11-21: 2 mg via INTRAVENOUS

## 2015-11-21 MED ORDER — VERAPAMIL HCL 2.5 MG/ML IV SOLN
INTRAVENOUS | Status: DC | PRN
  Administered 2015-11-21: 10 mL via INTRA_ARTERIAL

## 2015-11-21 MED ORDER — HEPARIN SODIUM (PORCINE) 1000 UNIT/ML IJ SOLN
INTRAMUSCULAR | Status: DC | PRN
  Administered 2015-11-21: 3500 [IU] via INTRAVENOUS

## 2015-11-21 SURGICAL SUPPLY — 14 items
CATH INFINITI 5 FR JL3.5 (CATHETERS) ×2 IMPLANT
CATH INFINITI 5FR ANG PIGTAIL (CATHETERS) ×2 IMPLANT
CATH OPTITORQUE TIG 4.0 5F (CATHETERS) ×2 IMPLANT
DEVICE RAD COMP TR BAND LRG (VASCULAR PRODUCTS) ×2 IMPLANT
GLIDESHEATH SLEND A-KIT 6F 22G (SHEATH) ×2 IMPLANT
GLIDESHEATH SLEND SS 6F .021 (SHEATH) ×2 IMPLANT
GUIDEWIRE INQWIRE 1.5J.035X260 (WIRE) ×1 IMPLANT
INQWIRE 1.5J .035X260CM (WIRE) ×2
KIT HEART LEFT (KITS) ×2 IMPLANT
PACK CARDIAC CATHETERIZATION (CUSTOM PROCEDURE TRAY) ×2 IMPLANT
SYR MEDRAD MARK V 150ML (SYRINGE) ×2 IMPLANT
TRANSDUCER W/STOPCOCK (MISCELLANEOUS) ×2 IMPLANT
TUBING CIL FLEX 10 FLL-RA (TUBING) ×2 IMPLANT
WIRE HI TORQ VERSACORE-J 145CM (WIRE) ×2 IMPLANT

## 2015-11-21 NOTE — Discharge Instructions (Signed)
°  Post Anesthesia Home Care Instructions  Activity: Get plenty of rest for the remainder of the day. A responsible adult should stay with you for 24 hours following the procedure.  For the next 24 hours, DO NOT: -Drive a car -Paediatric nurse -Drink alcoholic beverages -Take any medication unless instructed by your physician -Make any legal decisions or sign important papers.  Meals: Start with liquid foods such as gelatin or soup. Progress to regular foods as tolerated. Avoid greasy, spicy, heavy foods. If nausea and/or vomiting occur, drink only clear liquids until the nausea and/or vomiting subsides. Call your physician if vomiting continues.  Special Instructions/Symptoms: Your throat may feel dry or sore from the anesthesia or the breathing tube placed in your throat during surgery. If this causes discomfort, gargle with warm salt water. The discomfort should disappear within 24 hours.  If you had a scopolamine patch placed behind your ear for the management of post- operative nausea and/or vomiting:  1. The medication in the patch is effective for 72 hours, after which it should be removed.  Wrap patch in a tissue and discard in the trash. Wash hands thoroughly with soap and water. 2. You may remove the patch earlier than 72 hours if you experience unpleasant side effects which may include dry mouth, dizziness or visual disturbances. 3. Avoid touching the patch. Wash your hands with soap and water after contact with the patch.   Radial Site Care Refer to this sheet in the next few weeks. These instructions provide you with information on caring for yourself after your procedure. Your caregiver may also give you more specific instructions. Your treatment has been planned according to current medical practices, but problems sometimes occur. Call your caregiver if you have any problems or questions after your procedure. HOME CARE INSTRUCTIONS  You may shower the day after the  procedure.Remove the bandage (dressing) and gently wash the site with plain soap and water.Gently pat the site dry.   Do not apply powder or lotion to the site.   Do not submerge the affected site in water for 3 to 5 days.   Inspect the site at least twice daily.   Do not flex or bend the affected arm for 24 hours.   No lifting over 5 pounds (2.3 kg) for 5 days after your procedure.   Do not drive home if you are discharged the same day of the procedure. Have someone else drive you.   You may drive 24 hours after the procedure unless otherwise instructed by your caregiver.  What to expect:  Any bruising will usually fade within 1 to 2 weeks.   Blood that collects in the tissue (hematoma) may be painful to the touch. It should usually decrease in size and tenderness within 1 to 2 weeks.  SEEK IMMEDIATE MEDICAL CARE IF:  You have unusual pain at the radial site.   You have redness, warmth, swelling, or pain at the radial site.   You have drainage (other than a small amount of blood on the dressing).   You have chills.   You have a fever or persistent symptoms for more than 72 hours.   You have a fever and your symptoms suddenly get worse.   Your arm becomes pale, cool, tingly, or numb.   You have heavy bleeding from the site. Hold pressure on the site.

## 2015-11-21 NOTE — Progress Notes (Signed)
Patient Name: Marie Pierce Date of Encounter: 11/21/2015  Primary Cardiologist: Dr. Burt Knack (seen in past by Dr. Caryl Comes)  [facility] Problem List     Active Problems:   Breast cancer of upper-outer quadrant of right female breast ([place])   Elevated troponin   SVT (supraventricular tachycardia) ([place])    Subjective   Feeling well. No chest pain. Ready to go home.   Inpatient Medications    Scheduled Meds: . sodium chloride flush  3 mL Intravenous Q12H  . tamoxifen  20 mg Oral Daily   Continuous Infusions: . sodium chloride     PRN Meds: sodium chloride, acetaminophen, diazepam, fentaNYL (SUBLIMAZE) injection, ondansetron (ZOFRAN) IV, promethazine, sodium chloride flush   Vital Signs    Vitals:   11/21/15 0809 11/21/15 0814 11/21/15 0819 11/21/15 0824  BP: (!) 142/82 (!) 141/87 (!) 172/98   Pulse: 84 79 89 (!) 0  Resp: 17 14 18  (!) 8  Temp:      TempSrc:      SpO2: 100% 100% 99% (!) 0%  Weight:      Height:        Intake/Output Summary (Last 24 hours) at 11/21/15 0934 Last data filed at 11/21/15 0609  Gross per 24 hour  Intake              770 ml  Output                0 ml  Net              770 ml   Filed Weights   11/19/15 0641 11/20/15 0548 11/21/15 0515  Weight: 156 lb (70.8 kg) 155 lb 8 oz (70.5 kg) 154 lb 8 oz (70.1 kg)    Physical Exam   GEN: Well nourished, well developed, in no acute distress.  HEENT: Grossly normal.  Neck: Supple, no JVD, carotid bruits, or masses. Cardiac: RRR, no murmurs, rubs, or gallops. No clubbing, cyanosis, edema.  Radials/DP/PT 2+ and equal bilaterally.  Respiratory:  Respirations regular and unlabored, clear to auscultation bilaterally. GI: Soft, nontender, nondistended, BS + x 4. MS: no deformity or atrophy. Skin: warm and dry, no rash. Neuro:  Strength and sensation are intact. Psych: AAOx3.  Normal affect.  Labs    CBC  Recent Labs  11/20/15 0236 11/21/15 0538  WBC 5.6 4.8  HGB 13.5 14.0  HCT 40.6 42.0    MCV 96.9 97.2  PLT 143* [id number]   Basic Metabolic Panel  Recent Labs  11/20/15 0236 11/21/15 0538  NA 140 141  K 4.0 4.0  CL 108 109  CO2 28 26  GLUCOSE 139* 104*  BUN 10 10  CREATININE 0.64 0.67  CALCIUM 8.2* 8.6*  MG 2.2  --    Liver Function Tests  Recent Labs  11/19/15 0949  AST 36  ALT 45  ALKPHOS 64  BILITOT 0.6  PROT 4.8*  ALBUMIN 3.1*   No results for input(s): LIPASE, AMYLASE in the last 72 hours. Cardiac Enzymes  Recent Labs  11/19/15 1815 11/19/15 2106 11/20/15 0236  TROPONINI 0.74* 0.46* 0.22*   Thyroid Function Tests  Recent Labs  11/19/15 1000  TSH 1.008    Telemetry    NSR with some sinus tach and artifact - Personally Reviewed  ECG    #1: Baseline 11/14/2015: Normal sinus rhythm, within normal limits #2: 11/19/2015 0814: Sinus tachycardia with ST/T-wave change possible inferior and anterior ischemia #3:11/19/2015 0946: Normal sinus rhythm, within normal limits - Personally  Reviewed  Radiology    Nm Myocar Multi W/spect W/wall Motion / Ef  Result Date: 11/20/2015  Defect 1: There is a medium defect of moderate severity present in the mid anterior, apical anterior and apex location.  This is an intermediate risk study.  The left ventricular ejection fraction is mildly decreased (45-54%).     Cardiac Studies   LHC 11/21/15 Conclusion  Normal coronary arteries. Normal global LV function with an ejection fraction of 55-60%.  However, there is a small focal region of mild mid anterolateral hypocontractility which raises the possibility of transient vasospasm in the etiology of this wall motion abnormality. RECOMMENDATON: Based on the patient's catheterization, she can be given clearance for her planned surgery.  With her history of PSVT and possibility of transient vasospasm, she may benefit from verapamil or Cardizem.   11/20/15 Stress test returned "intermediate risk" Study Result    Defect 1: There is a medium defect of  moderate severity present in the mid anterior, apical anterior and apex location.  This is an intermediate risk study.  The left ventricular ejection fraction is mildly decreased (45-54%).     Patient Profile     60 y/o female with a prior h/o PSVT/ AV nodal reentry, breast CA s/p right lumpectomy + radiation therapy in 2016 and h/o HTN who [facility] for elective outpatientplastic surgery involving the right breast. After administration of anesthesia, patient reporteddeveloped rapid bigeminy/ ? Junctional rhythm? VT and surgery was aborted. Pt transferred to [facility] ED. Cardiology consulted for recommendations. Patient admitted to obs for abnormal troponin.   Assessment & Plan    Arrhythmia: Patient with reported tachy arrhythmia at outpatient surgery center following administration of anesthesia for plastic surgery involving the right breast. Surgery was aborted. Unfortunately, her arrhthymia was not recorded on printable strips. We have no visual documentation of what occurred. Telemetry has been unremarkable.  Abnormal Troponin: 0.74>>0.46>>0.22. She denies CP. Baseline EKG prior to stress showed NSR with no ischemic abnormalities. She underwent stress testing on 11/20/15 which was "intermediate risk" and she was referred for heart catheterization which was done this AM. This showed normal coronary arteries with a small focal area of anterolateral  hypo contractility. Dr. Claiborne Billings recommended CCB therapy for possible spasm given her hx of PSVT. Since she came in with a junctional rhythm and really does not want to take a daily medication, we will hold off on this.   Hx of SVT: has been quiescent for many years on no therapy. She does not want to take a daily medication for this or possible spasm   H/o Rt sided Breast CA: treated in 2016 with lumpectomy + radiation. Plans for reconstructive surgery of right breast currently on hold given events perioperatively. She has been  cleared for surgery   Dispo: will discharge home today with close outpatient follow up.   Signed, Angelena Form, PA-C  11/21/2015, 9:34 AM   I have personally seen and examined this patient with Angelena Form, PA-C. I agree with the assessment and plan as outlined above. Cardiac cath with normal coronary arteries. LV function is normal. She can be discharged today and see [place] back in 1-2 weeks. She can proceed with her planned surgical procedure.   Lauree Chandler 11/21/2015 9:43 AM

## 2015-11-21 NOTE — Discharge Summary (Signed)
Discharge Summary    Patient ID: Marie Pierce,  MRN: [id number], DOB/AGE: Dec 22, 1955 59 y.o.  Admit date: 11/19/2015 Discharge date: 11/21/2015  Primary Care Provider: Chauncey Cruel Primary Cardiologist: Dr. Burt Knack Dr. Caryl Comes   Discharge Diagnoses    Principal Problem:   Elevated troponin Active Problems:   Breast cancer of upper-outer quadrant of right female breast [facility])   SVT (supraventricular tachycardia) ([facility])   Allergies Allergies  Allergen Reactions  . Morphine And Related Hives    hives  . Sulfonamide Derivatives Hives    REACTION: hives  . Ultram [Tramadol Hcl] Other (See Comments)    Pt reports she was very dizzy with this medication  . Penicillins Swelling and Rash    Swelling throat     History of Present Illness     60 y/o female with a prior h/o PSVT/ AV nodal reentry, breast CA s/p right lumpectomy + radiation therapy in 2016 and h/o HTN who presented to [facility] on 11/19/15 for elective outpatientplastic surgery involving the right breast. After administration of anesthesia, patient reporteddeveloped rapid bigeminy/ ? Junctional rhythm? VT and surgery was aborted. Pt transferred to [facility] ED. Cardiology consulted for recommendations. Patient admitted to obs for abnormal troponin.   She has a history of PSVT and seen by Dr. Caryl Comes in the past. She was treated with PRN beta blockers but she never used this and stopped filling the Rx. She had a 2D echo in 2007 that showed normal LVEF estimated to be 65 %. There was mild to moderate thickening of the mitral valve w/o stenosis or regurgitation. No other cardiac history.   She has a history of breast cancer and presented to the outpatient surgery center on 11/19/15 for planned elective reconstructive surgery. Per review of reports, after she was intubated and sedated she was noted to go into a junctional bradycardia, bundle branch block with NSVT and then bigeminy. She was given Epi 50 mcg, lidocaine 100mg ,  and Atropine 0.4mg  given. She was extubated and transferred to [facility] ED for further management and work up.  In ED, the patient was alert and stable. EKGs in the ED showed NSR. No arrhthymias on telemetry. Pt asymptomatic. She denied any recent h/o CP, dyspnea, palpitations, dizziness, syncope/ near syncope.   Hospital Course     Consultants: none   Arrhythmia:Patient with reported arrhythmia at outpatient surgery center following administration of anesthesia for plastic surgery involving the right breast. Surgery was aborted. Unfortunately, her arrhthymia was not recorded on printable strips. We have no visual documentation of what occurred. Telemetry during admission has been unremarkable. TSH normal.   Abnormal Troponin: 0.74>>0.46>>0.22. She denies CP. Baseline EKG prior to stress showed NSR with no ischemic abnormalities. She underwent stress testing on 11/20/15 which was "intermediate risk" and she was referred for heart catheterization which was done this AM. This showed normal coronary arteries with a small focal area of anterolateral  hypocontractility. Dr. Claiborne Billings recommended CCB therapy for possible spasm given her hx of PSVT. Since she came in with a junctional rhythm and really does not want to take a daily medication, we will hold off on this.   Hx of SVT: has been quiescent for many years on no therapy. She does not want to take a daily medication for this or possible spasm   H/o Rt sided Breast [id number] in 2016 with lumpectomy + radiation. Plans for reconstructive surgery of right breast currently on hold given events perioperatively. She has been cleared for surgery  when rescheduled in the future.    The patient has had an uncomplicated hospital course and is recovering well. The radial catheter site is stable. She has been seen by Dr. Julianne Handler today and deemed ready for discharge home. All follow-up appointments have been scheduled. Discharge medications are listed  below.  _____________  Discharge Vitals Blood pressure (!) 141/76, pulse (!) 0, temperature 98.4 F (36.9 C), temperature source Oral, resp. rate (!) 8, height 5\' 2"  (1.575 m), weight 154 lb 8 oz (70.1 kg), SpO2 96 %.  Filed Weights   11/19/15 0641 11/20/15 0548 11/21/15 0515  Weight: 156 lb (70.8 kg) 155 lb 8 oz (70.5 kg) 154 lb 8 oz (70.1 kg)    Labs & Radiologic Studies     CBC  Recent Labs  11/20/15 0236 11/21/15 0538  WBC 5.6 4.8  HGB 13.5 14.0  HCT 40.6 42.0  MCV 96.9 97.2  PLT 143* [id number]   Basic Metabolic Panel  Recent Labs  11/20/15 0236 11/21/15 0538  NA 140 141  K 4.0 4.0  CL 108 109  CO2 28 26  GLUCOSE 139* 104*  BUN 10 10  CREATININE 0.64 0.67  CALCIUM 8.2* 8.6*  MG 2.2  --    Liver Function Tests  Recent Labs  11/19/15 0949  AST 36  ALT 45  ALKPHOS 64  BILITOT 0.6  PROT 4.8*  ALBUMIN 3.1*   No results for input(s): LIPASE, AMYLASE in the last 72 hours. Cardiac Enzymes  Recent Labs  11/19/15 1815 11/19/15 2106 11/20/15 0236  TROPONINI 0.74* 0.46* 0.22*   BNP Invalid input(s): POCBNP D-Dimer No results for input(s): DDIMER in the last 72 hours. Hemoglobin A1C No results for input(s): HGBA1C in the last 72 hours. Fasting Lipid Panel No results for input(s): CHOL, HDL, LDLCALC, TRIG, CHOLHDL, LDLDIRECT in the last 72 hours. Thyroid Function Tests  Recent Labs  11/19/15 1000  TSH 1.008    Nm Myocar Multi W/spect W/wall Motion / Ef  Result Date: 11/20/2015  Defect 1: There is a medium defect of moderate severity present in the mid anterior, apical anterior and apex location.  This is an intermediate risk study.  The left ventricular ejection fraction is mildly decreased (45-54%).      Diagnostic Studies/Procedures    LHC 11/21/15 Conclusion  Normal coronary arteries. Normal global LV function with an ejection fraction of 55-60%. However, there is a small focal region of mild mid anterolateral hypocontractility which  raises the possibility of transient vasospasm in the etiology of this wall motion abnormality. RECOMMENDATON: Based on the patient's catheterization, she can be given clearance for her planned surgery. With her history of PSVT and possibility of transient vasospasm, she may benefit from verapamil or Cardizem.    _____________   11/20/15 Stress test returned "intermediate risk" Study Result    Defect 1: There is a medium defect of moderate severity present in the mid anterior, apical anterior and apex location.  This is an intermediate risk study.  The left ventricular ejection fraction is mildly decreased (45-54%).       Disposition   Pt is being discharged home today in good condition.  Follow-up Plans & Appointments    Follow-up Information    Lyda Jester, PA-C Follow up on 12/05/2015.   Specialties:  Cardiology, Radiology Why:  @ 8am. Please arrive at least 10 minutes early Contact information: [address] [place] [address] [phone]  Discharge Medications     Medication List    TAKE these medications   acetaminophen 500 MG tablet Commonly known as:  TYLENOL Take 1,000 mg by mouth every 6 (six) hours as needed.   esomeprazole 40 MG capsule Commonly known as:  NEXIUM Take 1 capsule (40 mg total) by mouth daily before breakfast.   tamoxifen 20 MG tablet Commonly known as:  NOLVADEX Take 1 tablet (20 mg total) by mouth daily.   venlafaxine XR 37.5 MG 24 hr capsule Commonly known as:  EFFEXOR-XR Take 2 capsules (75 mg total) by mouth daily with breakfast. What changed:  how much to take       Outstanding Labs/Studies   None  Duration of Discharge Encounter   Greater than 30 minutes including physician time.  Signed, Angelena Form PA-C 11/21/2015, 10:39 AM

## 2015-11-21 NOTE — H&P (View-Only) (Signed)
Patient Name: Marie Pierce Date of Encounter: 11/20/2015  Primary Cardiologist: Dr. Tyrell Antonio Problem List     Active Problems:   Elevated troponin  Patient Profile     60 y/o female with a prior h/o PSVT/ AV nodal reentry, breast CA s/p right lumpectomy  + radiation therapy in 2016 and h/o HTN who presented for elective outpatient plastic surgery involving the right breast. After administration of anesthesia, patient reported developed rapid bigeminy/ ? Junctional rhythm? VT and surgery was aborted. Pt transferred to [facility] ED. Cardiology consulted for recommendations. Patient admitted to obs for abnormal troponin.   Subjective   No complaints. Denies CP, dyspnea and palpations.   Inpatient Medications    Scheduled Meds: . regadenoson      . tamoxifen  20 mg Oral Daily   Continuous Infusions:  PRN Meds:    Vital Signs    Vitals:   11/20/15 0548 11/20/15 0910 11/20/15 0934 11/20/15 0937  BP: 124/75 131/78 124/71 127/72  Pulse: 67     Resp:      Temp: 98.5 F (36.9 C)     TempSrc: Oral     SpO2: 99%     Weight: 155 lb 8 oz (70.5 kg)     Height:        Intake/Output Summary (Last 24 hours) at 11/20/15 0950 Last data filed at 11/20/15 0931  Gross per 24 hour  Intake              480 ml  Output                0 ml  Net              480 ml   Filed Weights   11/13/15 1129 11/19/15 0641 11/20/15 0548  Weight: 155 lb (70.3 kg) 156 lb (70.8 kg) 155 lb 8 oz (70.5 kg)    Physical Exam   GEN: Well nourished, well developed, in no acute distress.  HEENT: Grossly normal.  Neck: Supple, no JVD, carotid bruits, or masses. Cardiac: RRR, no murmurs, rubs, or gallops. No clubbing, cyanosis, edema.  Radials/DP/PT 2+ and equal bilaterally.  Respiratory:  Respirations regular and unlabored, clear to auscultation bilaterally. GI: Soft, nontender, nondistended, BS + x 4. MS: no deformity or atrophy. Skin: warm and dry, no rash. Neuro:  Strength and sensation are  intact. Psych: AAOx3.  Normal affect.  Labs    CBC  Recent Labs  11/19/15 0949 11/20/15 0236  WBC 4.4 5.6  HGB 13.1 13.5  HCT 39.8 40.6  MCV 96.6 96.9  PLT 128* [id number]*   Basic Metabolic Panel  Recent Labs  11/19/15 0949 11/20/15 0236  NA 138 140  K 3.9 4.0  CL 106 108  CO2 23 28  GLUCOSE 100* 139*  BUN 11 10  CREATININE 0.72 0.64  CALCIUM 8.2* 8.2*  MG  --  2.2   Liver Function Tests  Recent Labs  11/19/15 0949  AST 36  ALT 45  ALKPHOS 64  BILITOT 0.6  PROT 4.8*  ALBUMIN 3.1*   No results for input(s): LIPASE, AMYLASE in the last 72 hours. Cardiac Enzymes  Recent Labs  11/19/15 1815 11/19/15 2106 11/20/15 0236  TROPONINI 0.74* 0.46* 0.22*   BNP Invalid input(s): POCBNP D-Dimer No results for input(s): DDIMER in the last 72 hours. Hemoglobin A1C No results for input(s): HGBA1C in the last 72 hours. Fasting Lipid Panel No results for input(s): CHOL, HDL, LDLCALC, TRIG, CHOLHDL,  LDLDIRECT in the last 72 hours. Thyroid Function Tests  Recent Labs  11/19/15 1000  TSH 1.008    Telemetry    NSR - Personally Reviewed  ECG    NSR - Personally Reviewed  Radiology    No results found.  Cardiac Studies   NST- pending   Patient Profile     61 y/o female with a prior h/o PSVT/ AV nodal reentry, breast CA s/p right lumpectomy  + radiation therapy in 2016 and h/o HTN who presented for elective outpatient plastic surgery involving the right breast. After administration of anesthesia, patient reported developed rapid bigeminy/ ? Junctional rhythm? VT and surgery was aborted. Pt transferred to [facility] ED. Cardiology consulted for recommendations.   Assessment & Plan    1. Arrhythmia: Patient with reported tachy arrhythmia at outpatient surgery center following administration of anesthesia for plastic surgery involving the right breast. Surgery was aborted. Unfortunately, her arrhthymia was not recorded on printable strips. We have no visual  documentation of what occurred. EKG in nuc medicine shows NSR. She had no arrhthymias during stress test. MD to review overnight telemetry when she returns to the floor. She remained asymptomatic overnight.   2. Abnormal Troponin: 0.74>>0.46>>0.22. She denies CP. Baseline EKG prior to stress showed NSR with no ischemic abnormalities. She was noted to have flattening of [place] in V4-V6 during stress test, which all returned to baseline prior to test completion. Radiologist interpretation pending.   3. H/o Rt sided Breast CA: treated in 2016 with lumpectomy + radiation. Plans for reconstructive surgery of right breast currently on hold given events yesterday. Ischemic w/u pending.   Signed, Lyda Jester, PA-C  11/20/2015, 9:50 AM   I have personally seen and examined this patient with Lyda Jester, PA-C. I agree with the assessment and plan as outlined above. She is admitted after having reported bradycardia/junctional rhythm after anesthesia was induced yesterday for elective breast surgery. Troponin was mildly elevated on admission but she had no c/o chest pain. Rare PACs on tele overnight. No chest pain this am. Plan per admitting team for nuclear stress test today. If no ischemia noted,she can be discharged home. If stress test is abnormal, will need cardiac cath to exclude obstructive CAD.   Lauree Chandler 11/20/2015 10:30 AM

## 2015-11-21 NOTE — Progress Notes (Signed)
Patient being discharged home per MD order. All discharge instructions given to patient in written form. Husband at bedside.

## 2015-11-21 NOTE — Interval H&P Note (Signed)
Cath Lab Visit (complete for each Cath Lab visit)  Clinical Evaluation Leading to the Procedure:   ACS: No.  Non-ACS:    Anginal Classification: CCS II  Anti-ischemic medical therapy: No Therapy  Non-Invasive Test Results: Intermediate-risk stress test findings: cardiac mortality 1-3%/year  Prior CABG: No previous CABG      History and Physical Interval Note:  11/21/2015 7:47 AM  Marie Pierce  has presented today for surgery, with the diagnosis of abnormal stress tesy, positive troponin  The various methods of treatment have been discussed with the patient and family. After consideration of risks, benefits and other options for treatment, the patient has consented to  Procedure(s): Left Heart Cath and Coronary Angiography (N/A) as a surgical intervention .  The patient's history has been reviewed, patient examined, no change in status, stable for surgery.  I have reviewed the patient's chart and labs.  Questions were answered to the patient's satisfaction.     Shelva Majestic

## 2015-11-25 ENCOUNTER — Telehealth: Payer: Self-pay | Admitting: Cardiology

## 2015-11-25 NOTE — Telephone Encounter (Signed)
New message       Pt is calling to ask if she still need to come to see the PA on 12-05-15 since she is seeing Dr Caryl Comes on 12-12-15?

## 2015-11-25 NOTE — Telephone Encounter (Signed)
Left a detailed message on the pts confirmed VM, to keep her scheduled appt with Ellen Henri PA-C for 12/05/15.  Reiterated the importance of a post-hospital follow-up appt on the pts confirmed VM. Left a message for the pt to call back to assist in answering any additional questions regarding this concern.

## 2015-12-03 ENCOUNTER — Ambulatory Visit: Payer: 59 | Admitting: Cardiology

## 2015-12-05 ENCOUNTER — Encounter: Payer: 59 | Admitting: Cardiology

## 2015-12-06 DIAGNOSIS — N6489 Other specified disorders of breast: Secondary | ICD-10-CM

## 2015-12-06 HISTORY — DX: Other specified disorders of breast: N64.89

## 2015-12-11 ENCOUNTER — Encounter: Payer: 59 | Admitting: Cardiology

## 2015-12-12 ENCOUNTER — Encounter: Payer: Self-pay | Admitting: Internal Medicine

## 2015-12-12 ENCOUNTER — Ambulatory Visit (INDEPENDENT_AMBULATORY_CARE_PROVIDER_SITE_OTHER): Payer: 59 | Admitting: Internal Medicine

## 2015-12-12 ENCOUNTER — Encounter (INDEPENDENT_AMBULATORY_CARE_PROVIDER_SITE_OTHER): Payer: Self-pay

## 2015-12-12 VITALS — BP 142/82 | HR 79 | Ht 62.0 in | Wt 157.8 lb

## 2015-12-12 DIAGNOSIS — I471 Supraventricular tachycardia: Secondary | ICD-10-CM

## 2015-12-12 NOTE — Progress Notes (Signed)
ELECTROPHYSIOLOGY CONSULT NOTE  Patient ID: THAI GEBRE, MRN: [id number], DOB/AGE: 01-16-1955 60 y.o. Admit date: (Not on file) Date of Consult: 12/12/2015  Primary Physician: Chauncey Cruel, MD Primary Cardiologist: Start Physician SK   Chief Complaint: arrhythmia   HPI CLOTIEL LOLLIS is a 60 y.o. female  With a presumed history of SVT for which medical therapy was elected and recurrences of which have not been an issue.  She had been admitted at: 11/17 following presentation for elective outpatient reconstructive surgery for breast cancer following a lumpectomy 2016 and adjunctive radiation.. After induction of anesthesia she reportedly developed "rapid bigeminy? Junctional rhythm? Ventricular tachycardia treated with atropine and epinephrine..  the surgery was aborted. There are no recordings available of the rhythm abnormality.    Laboratories were also notable for borderline positive troponin at 0.74.  She underwent catheterization demonstrating normal coronary arteries.  She is here today as anesthesia requested that she see EP prior to returning to the OR    Past Medical History:  Diagnosis Date  . Arthritis    hands and feet  . Breast cancer of upper-outer quadrant of right female breast ([place]) 07/06/2014  . Colon polyps    adenomatous  . Elevated troponin    a. diagnosed in the setting of an unknown abnormal heart rhythm during pre op period on 11/2015. Cath with no CAD.   [name] Gastric ulcer   . GERD (gastroesophageal reflux disease)   . PAT (paroxysmal atrial tachycardia) ([place]) 2015   a. used PRN BBs but has been quiescent for many years on no therapy.   . S/P radiation therapy Right breast 4500 cGy in 25 sessions , right breast tumor bed boost 540 cGy in 3 sessions    Right breast 4500 cGy in 25 sessions , right breast tumor bed boost 540 cGy in 3 sessions       Surgical History:  Past Surgical  History:  Procedure Laterality Date  . ABDOMINAL HYSTERECTOMY    . BREAST BIOPSY     left x2; right x 1  . CARDIAC CATHETERIZATION N/A 11/21/2015   Procedure: Left Heart Cath and Coronary Angiography;  Surgeon: Troy Sine, MD;  Location: [place] [facility];  Service: Cardiovascular;  Laterality: N/A;  . CESAREAN SECTION    . COLONOSCOPY    . FOOT SURGERY     Left  . HEMORRHOID SURGERY    . POLYPECTOMY    . RADIOACTIVE SEED GUIDED MASTECTOMY WITH AXILLARY SENTINEL LYMPH NODE BIOPSY Right 07/17/2014   Procedure: RADIOACTIVE SEED GUIDED PARTIAL MASTECTOMY WITH AXILLARY SENTINEL LYMPH NODE BIOPSY;  Surgeon: [name], MD;  Location: [place];  Service: General;  Laterality: Right;  . TONSILLECTOMY       Home Meds: Prior to Admission medications   Medication Sig Start Date End Date Taking? Authorizing Provider  acetaminophen (TYLENOL) 500 MG tablet Take 1,000 mg by mouth every 6 (six) hours as needed.      Historical Provider, MD  esomeprazole (NEXIUM) 40 MG capsule Take 1 capsule (40 mg total) by mouth daily before breakfast. 10/23/13   Irene Shipper, MD  tamoxifen (NOLVADEX) 20 MG tablet Take 1 tablet (20 mg total) by mouth daily. 08/13/15   Chauncey Cruel, MD  venlafaxine XR (EFFEXOR-XR) 37.5 MG 24 hr capsule Take 2 capsules (75 mg total) by mouth daily with breakfast. Patient taking differently: Take 37.5 mg by mouth daily with breakfast.  08/13/15   Sarajane Jews  Everett Graff, MD    Allergies:  Allergies  Allergen Reactions  . Morphine And Related Hives    hives  . Sulfonamide Derivatives Hives    REACTION: hives  . Ultram [Tramadol Hcl] Other (See Comments)    Pt reports she was very dizzy with this medication  . Penicillins Swelling and Rash    Swelling throat    Social History   Social History  . Marital status: Married    Spouse name: N/A  . Number of children: N/A  . Years of education: N/A   Occupational History  . [employer]     OR  Nurse    Social History Main Topics  . Smoking status: Never Smoker  . Smokeless tobacco: Never Used  . Alcohol use 0.6 - 1.8 oz/week    1 - 3 Glasses of wine per week     Comment: occ  . Drug use: No  . Sexual activity: Yes    Birth control/ protection: Surgical   Other Topics Concern  . Not on file   Social History Narrative  . No narrative on file     Family History  Problem Relation Age of Onset  . Colon cancer Father 73    and dx. again at 61  . Stomach cancer Maternal Aunt 60  . Diabetes Mother   . Heart disease Mother   . Breast cancer Paternal Aunt     dx. 72s  . Breast cancer Maternal Grandmother 31  . Skin cancer Brother     not melanoma  . Colon polyps Brother     1 polyp on colonoscopy  . Breast cancer Maternal Aunt 52  . Lung cancer Maternal Uncle     smoker  . Lung cancer Maternal Uncle     smoker  . Brain cancer Maternal Uncle     tumor type unknown  . Breast cancer Cousin     underwent BL mastectomies  . Leukemia Cousin 18     ROS:  Please see the history of present illness.     All other systems reviewed and negative.    Physical Exam:  Blood pressure (!) 142/82, pulse 79, height 5\' 2"  (1.575 m), weight 157 lb 12.8 oz (71.6 kg), SpO2 99 %. General: Well developed, well nourished female in no acute distress. Head: Normocephalic, atraumatic, sclera non-icteric, no xanthomas, nares are without discharge. EENT: normal  Lymph Nodes:  none Neck: Negative for carotid bruits. JVD not elevated. Back:without scoliosis kyphosis Lungs: Clear bilaterally to auscultation without wheezes, rales, or rhonchi. Breathing is unlabored. Heart: RRR with S1 S2. 2/6 murmur . No rubs, or gallops appreciated. Abdomen: Soft, non-tender, non-distended with normoactive bowel sounds. No hepatomegaly. No rebound/guarding. No obvious abdominal masses. Msk:  Strength and tone appear normal for age. Extremities: No clubbing or cyanosis. No  edema.  Distal pedal pulses are  2+ and equal bilaterally. Skin: Warm and Dry Neuro: Alert and oriented X 3. CN III-XII intact Grossly normal sensory and motor function . Psych:  Responds to questions appropriately with a normal affect.      Labs: Cardiac Enzymes No results for input(s): CKTOTAL, CKMB, TROPONINI in the last 72 hours. CBC Lab Results  Component Value Date   WBC 4.8 11/21/2015   HGB 14.0 11/21/2015   HCT 42.0 11/21/2015   MCV 97.2 11/21/2015   PLT 150 11/21/2015   PROTIME: No results for input(s): LABPROT, INR in the last 72 hours. Chemistry No results for input(s): NA,  K, CL, CO2, BUN, CREATININE, CALCIUM, PROT, BILITOT, ALKPHOS, ALT, AST, GLUCOSE in the last 168 hours.  Invalid input(s): LABALBU Lipids No results found for: CHOL, HDL, LDLCALC, TRIG BNP No results found for: PROBNP Thyroid Function Tests: No results for input(s): TSH, T4TOTAL, T3FREE, THYROIDAB in the last 72 hours.  Invalid input(s): FREET3 Miscellaneous No results found for: DDIMER  Radiology/Studies:  Nm Myocar Multi W/spect W/wall Motion / Ef  Result Date: 11/20/2015  Defect 1: There is a medium defect of moderate severity present in the mid anterior, apical anterior and apex location.  This is an intermediate risk study.  The left ventricular ejection fraction is mildly decreased (45-54%).     EKG: Sinus at 79 Intervals 13/09/38 No evidence of ventricular preexcitation despite the short PR interval   Assessment and Plan:  SVT clinically quiescent  Breast cancer status post lumpectomy and radiation therapy with anticipated breast reconstructive surgery  Arrhythmias-onset specified during recent induction of anesthesia  The hospital records were reviewed and are outlined above. It is not clear to me what happened but it's hard for me to believe that she had the panoply of arrhythmias that were described.  The patient and discussed the situation with some colleagues in the operating room at [facility] who  raised the possibility of about a sequencing issue between medications and intubation.  Having undergone extensive cardiac evaluation in the wake of the event, there is no evidence of structural heart disease. I think it is reasonable to anticipate that she would tolerate induction surgery without significant cardiac risk.  I discussed the case with Dr. Linna Caprice from anesthesia.  We will put the patient on a beta blocker for 48 hours prior to her surger to attenuate whenever adrenergic trigger might have been related. I've given her samples for Bystolic 5 mg to take Wednesday night and Thursday night before her Friday night procedure.       Virl Axe

## 2015-12-12 NOTE — Patient Instructions (Addendum)
Medication Instructions:    Your physician recommends that you continue on your current medications as directed. Please refer to the Current Medication list given to you today.  --- If you need a refill on your cardiac medications before your next appointment, please call your pharmacy. ---  Labwork:  None ordered  Testing/Procedures:  None ordered  Follow-Up:  No follow up is needed at this time with Dr. Caryl Comes.  He will see you on an as needed basis.  Any Other Special Instructions Will Be Listed Below (If Applicable). Take 1 Bystolic tablet (5mg  total) Wednesday night & Thursday night   Thank you for choosing [facility]!!

## 2015-12-23 DIAGNOSIS — J019 Acute sinusitis, unspecified: Secondary | ICD-10-CM

## 2015-12-23 HISTORY — DX: Acute sinusitis, unspecified: J01.90

## 2015-12-31 ENCOUNTER — Encounter (HOSPITAL_BASED_OUTPATIENT_CLINIC_OR_DEPARTMENT_OTHER): Payer: Self-pay | Admitting: *Deleted

## 2016-01-01 ENCOUNTER — Encounter (HOSPITAL_BASED_OUTPATIENT_CLINIC_OR_DEPARTMENT_OTHER): Payer: Self-pay | Admitting: *Deleted

## 2016-01-01 DIAGNOSIS — R059 Cough, unspecified: Secondary | ICD-10-CM

## 2016-01-01 HISTORY — DX: Cough, unspecified: R05.9

## 2016-01-01 NOTE — Pre-Procedure Instructions (Signed)
To come pick up Boost Breeze 8 oz. to drink AM DOS by 0700

## 2016-01-02 NOTE — Progress Notes (Signed)
Pt given 8 oz bottle of boost breeze to drink at 7 30 morning of surgery with written and verbal instructions.  Teach back , pt voiced understanding

## 2016-01-03 ENCOUNTER — Ambulatory Visit (HOSPITAL_BASED_OUTPATIENT_CLINIC_OR_DEPARTMENT_OTHER)
Admission: RE | Admit: 2016-01-03 | Discharge: 2016-01-03 | Disposition: A | Discharge status: Home / Self Care | Payer: 59 | Source: Ambulatory Visit | Attending: Plastic Surgery | Admitting: Plastic Surgery

## 2016-01-03 ENCOUNTER — Encounter (HOSPITAL_BASED_OUTPATIENT_CLINIC_OR_DEPARTMENT_OTHER): Payer: Self-pay

## 2016-01-03 ENCOUNTER — Ambulatory Visit (HOSPITAL_BASED_OUTPATIENT_CLINIC_OR_DEPARTMENT_OTHER): Payer: 59 | Admitting: Anesthesiology

## 2016-01-03 ENCOUNTER — Encounter (HOSPITAL_BASED_OUTPATIENT_CLINIC_OR_DEPARTMENT_OTHER)
Admission: RE | Disposition: A | Discharge status: Home / Self Care | Payer: Self-pay | Source: Ambulatory Visit | Attending: Plastic Surgery

## 2016-01-03 DIAGNOSIS — Z882 Allergy status to sulfonamides status: Secondary | ICD-10-CM | POA: Insufficient documentation

## 2016-01-03 DIAGNOSIS — N6022 Fibroadenosis of left breast: Secondary | ICD-10-CM | POA: Insufficient documentation

## 2016-01-03 DIAGNOSIS — M549 Dorsalgia, unspecified: Secondary | ICD-10-CM | POA: Diagnosis not present

## 2016-01-03 DIAGNOSIS — K279 Peptic ulcer, site unspecified, unspecified as acute or chronic, without hemorrhage or perforation: Secondary | ICD-10-CM | POA: Insufficient documentation

## 2016-01-03 DIAGNOSIS — N6021 Fibroadenosis of right breast: Secondary | ICD-10-CM | POA: Diagnosis not present

## 2016-01-03 DIAGNOSIS — K219 Gastro-esophageal reflux disease without esophagitis: Secondary | ICD-10-CM | POA: Diagnosis not present

## 2016-01-03 DIAGNOSIS — N6012 Diffuse cystic mastopathy of left breast: Secondary | ICD-10-CM | POA: Diagnosis not present

## 2016-01-03 DIAGNOSIS — D0501 Lobular carcinoma in situ of right breast: Secondary | ICD-10-CM | POA: Diagnosis not present

## 2016-01-03 DIAGNOSIS — N62 Hypertrophy of breast: Secondary | ICD-10-CM | POA: Diagnosis present

## 2016-01-03 DIAGNOSIS — Z923 Personal history of irradiation: Secondary | ICD-10-CM | POA: Diagnosis not present

## 2016-01-03 DIAGNOSIS — M1990 Unspecified osteoarthritis, unspecified site: Secondary | ICD-10-CM | POA: Diagnosis not present

## 2016-01-03 DIAGNOSIS — Z853 Personal history of malignant neoplasm of breast: Secondary | ICD-10-CM | POA: Insufficient documentation

## 2016-01-03 DIAGNOSIS — Z885 Allergy status to narcotic agent status: Secondary | ICD-10-CM | POA: Diagnosis not present

## 2016-01-03 DIAGNOSIS — G8929 Other chronic pain: Secondary | ICD-10-CM | POA: Diagnosis not present

## 2016-01-03 DIAGNOSIS — N6489 Other specified disorders of breast: Secondary | ICD-10-CM | POA: Insufficient documentation

## 2016-01-03 DIAGNOSIS — N6011 Diffuse cystic mastopathy of right breast: Secondary | ICD-10-CM | POA: Insufficient documentation

## 2016-01-03 DIAGNOSIS — Z880 Allergy status to penicillin: Secondary | ICD-10-CM | POA: Diagnosis not present

## 2016-01-03 DIAGNOSIS — M542 Cervicalgia: Secondary | ICD-10-CM | POA: Diagnosis not present

## 2016-01-03 HISTORY — DX: Personal history of other diseases of the circulatory system: Z86.79

## 2016-01-03 HISTORY — DX: Cough: R05

## 2016-01-03 HISTORY — DX: Personal history of other diseases of the digestive system: Z87.19

## 2016-01-03 HISTORY — DX: Dental restoration status: Z98.811

## 2016-01-03 HISTORY — DX: Acute sinusitis, unspecified: J01.90

## 2016-01-03 HISTORY — DX: Other specified disorders of breast: N64.89

## 2016-01-03 HISTORY — PX: BREAST REDUCTION SURGERY: SHX8

## 2016-01-03 HISTORY — DX: Personal history of peptic ulcer disease: Z87.11

## 2016-01-03 HISTORY — DX: Personal history of malignant neoplasm of breast: Z85.3

## 2016-01-03 SURGERY — MAMMOPLASTY, REDUCTION
Anesthesia: General | Site: Breast | Laterality: Bilateral

## 2016-01-03 MED ORDER — CLINDAMYCIN PHOSPHATE 600 MG/50ML IV SOLN: 600.0000 mg | Freq: Once | INTRAVENOUS | Status: DC

## 2016-01-03 MED ORDER — CHLORHEXIDINE GLUCONATE CLOTH 2 % EX PADS: 6.0000 | MEDICATED_PAD | Freq: Once | CUTANEOUS | Status: DC

## 2016-01-03 MED ORDER — SODIUM CHLORIDE 0.9 % IJ SOLN
INTRAMUSCULAR | Status: AC
  Filled 2016-01-03: qty 20

## 2016-01-03 MED ORDER — HYDROCODONE-ACETAMINOPHEN 7.5-325 MG PO TABS: 1.0000 | ORAL_TABLET | Freq: Once | ORAL | Status: DC | PRN

## 2016-01-03 MED ORDER — KETOROLAC TROMETHAMINE 30 MG/ML IJ SOLN
INTRAMUSCULAR | Status: AC
  Filled 2016-01-03: qty 1

## 2016-01-03 MED ORDER — HYDROMORPHONE HCL 1 MG/ML IJ SOLN
INTRAMUSCULAR | Status: AC
  Filled 2016-01-03: qty 1

## 2016-01-03 MED ORDER — CELECOXIB 400 MG PO CAPS
400.0000 mg | ORAL_CAPSULE | ORAL | Status: AC
  Administered 2016-01-03: 400 mg via ORAL

## 2016-01-03 MED ORDER — FENTANYL CITRATE (PF) 100 MCG/2ML IJ SOLN
INTRAMUSCULAR | Status: AC
  Filled 2016-01-03: qty 2

## 2016-01-03 MED ORDER — CLINDAMYCIN PHOSPHATE 600 MG/50ML IV SOLN
INTRAVENOUS | Status: AC
  Filled 2016-01-03: qty 50

## 2016-01-03 MED ORDER — ACETAMINOPHEN 500 MG PO TABS
1000.0000 mg | ORAL_TABLET | ORAL | Status: AC
  Administered 2016-01-03: 1000 mg via ORAL

## 2016-01-03 MED ORDER — MIDAZOLAM HCL 2 MG/2ML IJ SOLN
INTRAMUSCULAR | Status: AC
  Filled 2016-01-03: qty 2

## 2016-01-03 MED ORDER — GABAPENTIN 300 MG PO CAPS
ORAL_CAPSULE | ORAL | Status: AC
  Filled 2016-01-03: qty 1

## 2016-01-03 MED ORDER — BUPIVACAINE LIPOSOME 1.3 % IJ SUSP
INTRAMUSCULAR | Status: DC | PRN
  Administered 2016-01-03: 20 mL

## 2016-01-03 MED ORDER — KETOROLAC TROMETHAMINE 30 MG/ML IJ SOLN: 30.0000 mg | Freq: Once | INTRAMUSCULAR | Status: DC | PRN

## 2016-01-03 MED ORDER — HYDRALAZINE HCL 20 MG/ML IJ SOLN
INTRAMUSCULAR | Status: DC | PRN
  Administered 2016-01-03: 5 mg via INTRAVENOUS

## 2016-01-03 MED ORDER — SODIUM CHLORIDE 0.9 % IJ SOLN
INTRAMUSCULAR | Status: AC
  Filled 2016-01-03: qty 10

## 2016-01-03 MED ORDER — ACETAMINOPHEN 500 MG PO TABS
ORAL_TABLET | ORAL | Status: AC
  Filled 2016-01-03: qty 2

## 2016-01-03 MED ORDER — DEXAMETHASONE SODIUM PHOSPHATE 10 MG/ML IJ SOLN
INTRAMUSCULAR | Status: AC
  Filled 2016-01-03: qty 1

## 2016-01-03 MED ORDER — ONDANSETRON HCL 4 MG/2ML IJ SOLN
INTRAMUSCULAR | Status: DC | PRN
Start: 2016-01-03 — End: 2016-01-03
  Administered 2016-01-03: 4 mg via INTRAVENOUS

## 2016-01-03 MED ORDER — SCOPOLAMINE 1 MG/3DAYS TD PT72
MEDICATED_PATCH | TRANSDERMAL | Status: AC
  Filled 2016-01-03: qty 1

## 2016-01-03 MED ORDER — CLINDAMYCIN PHOSPHATE 600 MG/50ML IV SOLN
600.0000 mg | Freq: Once | INTRAVENOUS | Status: AC
  Administered 2016-01-03: 600 mg via INTRAVENOUS

## 2016-01-03 MED ORDER — LIDOCAINE HCL (CARDIAC) 20 MG/ML IV SOLN
INTRAVENOUS | Status: DC | PRN
  Administered 2016-01-03: 20 mg via INTRAVENOUS

## 2016-01-03 MED ORDER — HYDRALAZINE HCL 20 MG/ML IJ SOLN
INTRAMUSCULAR | Status: AC
  Filled 2016-01-03: qty 1

## 2016-01-03 MED ORDER — SCOPOLAMINE 1 MG/3DAYS TD PT72: 1.0000 | MEDICATED_PATCH | Freq: Once | TRANSDERMAL | Status: DC | PRN

## 2016-01-03 MED ORDER — HYDROMORPHONE HCL 1 MG/ML IJ SOLN: 0.2500 mg | INTRAMUSCULAR | Status: DC | PRN

## 2016-01-03 MED ORDER — PROPOFOL 500 MG/50ML IV EMUL
INTRAVENOUS | Status: AC
  Filled 2016-01-03: qty 200

## 2016-01-03 MED ORDER — FENTANYL CITRATE (PF) 100 MCG/2ML IJ SOLN
50.0000 ug | INTRAMUSCULAR | Status: AC | PRN
  Administered 2016-01-03: 50 ug via INTRAVENOUS
  Administered 2016-01-03: 100 ug via INTRAVENOUS
  Administered 2016-01-03 (×5): 50 ug via INTRAVENOUS

## 2016-01-03 MED ORDER — MIDAZOLAM HCL 2 MG/2ML IJ SOLN
1.0000 mg | INTRAMUSCULAR | Status: DC | PRN
  Administered 2016-01-03: 2 mg via INTRAVENOUS

## 2016-01-03 MED ORDER — ESMOLOL HCL 100 MG/10ML IV SOLN
INTRAVENOUS | Status: DC | PRN
  Administered 2016-01-03: 20 mg via INTRAVENOUS
  Administered 2016-01-03: 10 mg via INTRAVENOUS

## 2016-01-03 MED ORDER — GABAPENTIN 300 MG PO CAPS
300.0000 mg | ORAL_CAPSULE | ORAL | Status: AC
  Administered 2016-01-03: 300 mg via ORAL

## 2016-01-03 MED ORDER — ALBUMIN HUMAN 5 % IV SOLN
INTRAVENOUS | Status: DC | PRN
  Administered 2016-01-03: 12:00:00 via INTRAVENOUS

## 2016-01-03 MED ORDER — PROMETHAZINE HCL 25 MG/ML IJ SOLN: 6.2500 mg | INTRAMUSCULAR | Status: DC | PRN

## 2016-01-03 MED ORDER — CELECOXIB 200 MG PO CAPS
ORAL_CAPSULE | ORAL | Status: AC
  Filled 2016-01-03: qty 2

## 2016-01-03 MED ORDER — SUGAMMADEX SODIUM 200 MG/2ML IV SOLN
INTRAVENOUS | Status: AC
  Filled 2016-01-03: qty 2

## 2016-01-03 MED ORDER — LABETALOL HCL 5 MG/ML IV SOLN
INTRAVENOUS | Status: AC
  Filled 2016-01-03: qty 4

## 2016-01-03 MED ORDER — PROMETHAZINE HCL 25 MG/ML IJ SOLN
INTRAMUSCULAR | Status: AC
  Filled 2016-01-03: qty 1

## 2016-01-03 MED ORDER — HYDROMORPHONE HCL 1 MG/ML IJ SOLN
0.2500 mg | INTRAMUSCULAR | Status: DC | PRN
  Administered 2016-01-03: 0.5 mg via INTRAVENOUS

## 2016-01-03 MED ORDER — LACTATED RINGERS IV SOLN
INTRAVENOUS | Status: DC
  Administered 2016-01-03: 10:00:00 via INTRAVENOUS

## 2016-01-03 MED ORDER — DEXAMETHASONE SODIUM PHOSPHATE 4 MG/ML IJ SOLN
INTRAMUSCULAR | Status: DC | PRN
  Administered 2016-01-03: 10 mg via INTRAVENOUS

## 2016-01-03 MED ORDER — ROCURONIUM BROMIDE 100 MG/10ML IV SOLN
INTRAVENOUS | Status: DC | PRN
  Administered 2016-01-03 (×3): 10 mg via INTRAVENOUS
  Administered 2016-01-03: 40 mg via INTRAVENOUS
  Administered 2016-01-03: 10 mg via INTRAVENOUS
  Administered 2016-01-03: 20 mg via INTRAVENOUS

## 2016-01-03 MED ORDER — PROPOFOL 500 MG/50ML IV EMUL
INTRAVENOUS | Status: DC | PRN
  Administered 2016-01-03: 200 ug/kg/min via INTRAVENOUS

## 2016-01-03 MED ORDER — PROPOFOL 10 MG/ML IV BOLUS
INTRAVENOUS | Status: DC | PRN
  Administered 2016-01-03: 40 mg via INTRAVENOUS
  Administered 2016-01-03: 150 mg via INTRAVENOUS

## 2016-01-03 MED ORDER — PROMETHAZINE HCL 25 MG/ML IJ SOLN
6.2500 mg | INTRAMUSCULAR | Status: DC | PRN
  Administered 2016-01-03: 6.25 mg via INTRAVENOUS

## 2016-01-03 MED ORDER — 0.9 % SODIUM CHLORIDE (POUR BTL) OPTIME
TOPICAL | Status: DC | PRN
  Administered 2016-01-03: 1000 mL

## 2016-01-03 MED ORDER — HYDROCODONE-ACETAMINOPHEN 5-325 MG PO TABS: 1.0000 | ORAL_TABLET | ORAL | 0 refills | Status: DC | PRN

## 2016-01-03 MED ORDER — SUGAMMADEX SODIUM 200 MG/2ML IV SOLN
INTRAVENOUS | Status: DC | PRN
  Administered 2016-01-03: 200 mg via INTRAVENOUS

## 2016-01-03 MED ORDER — LABETALOL HCL 5 MG/ML IV SOLN
INTRAVENOUS | Status: DC | PRN
  Administered 2016-01-03 (×2): 2.5 mg via INTRAVENOUS
  Administered 2016-01-03: 5 mg via INTRAVENOUS

## 2016-01-03 MED ORDER — KETOROLAC TROMETHAMINE 30 MG/ML IJ SOLN
INTRAMUSCULAR | Status: DC | PRN
  Administered 2016-01-03: 30 mg via INTRAVENOUS

## 2016-01-03 MED ORDER — LIDOCAINE 2% (20 MG/ML) 5 ML SYRINGE
INTRAMUSCULAR | Status: AC
  Filled 2016-01-03: qty 5

## 2016-01-03 MED ORDER — ONDANSETRON HCL 4 MG/2ML IJ SOLN
INTRAMUSCULAR | Status: AC
  Filled 2016-01-03: qty 2

## 2016-01-03 SURGICAL SUPPLY — 44 items
APPLIER CLIP 9.375 MED OPEN (MISCELLANEOUS) ×2
BINDER BREAST 3XL (BIND) IMPLANT
BINDER BREAST LRG (GAUZE/BANDAGES/DRESSINGS) IMPLANT
BINDER BREAST MEDIUM (GAUZE/BANDAGES/DRESSINGS) IMPLANT
BINDER BREAST XLRG (GAUZE/BANDAGES/DRESSINGS) IMPLANT
BINDER BREAST XXLRG (GAUZE/BANDAGES/DRESSINGS) IMPLANT
BLADE SURG 10 STRL SS (BLADE) ×12 IMPLANT
BNDG GAUZE ELAST 4 BULKY (GAUZE/BANDAGES/DRESSINGS) ×4 IMPLANT
CANISTER SUCT 1200ML W/VALVE (MISCELLANEOUS) ×2 IMPLANT
CHLORAPREP W/TINT 26ML (MISCELLANEOUS) ×2 IMPLANT
CLIP APPLIE 9.375 MED OPEN (MISCELLANEOUS) ×1 IMPLANT
COVER BACK TABLE 60X90IN (DRAPES) ×2 IMPLANT
COVER MAYO STAND STRL (DRAPES) ×2 IMPLANT
DRAIN CHANNEL 15F RND FF W/TCR (WOUND CARE) IMPLANT
DRAPE TOP ARMCOVERS (MISCELLANEOUS) ×2 IMPLANT
DRAPE U-SHAPE 76X120 STRL (DRAPES) ×2 IMPLANT
DRSG PAD ABDOMINAL 8X10 ST (GAUZE/BANDAGES/DRESSINGS) ×4 IMPLANT
ELECT COATED BLADE 2.86 ST (ELECTRODE) ×2 IMPLANT
ELECT REM PT RETURN 9FT ADLT (ELECTROSURGICAL) ×2
ELECTRODE REM PT RTRN 9FT ADLT (ELECTROSURGICAL) ×1 IMPLANT
EVACUATOR SILICONE 100CC (DRAIN) IMPLANT
GLOVE BIO SURGEON STRL SZ 6 (GLOVE) ×4 IMPLANT
GOWN STRL REUS W/ TWL LRG LVL3 (GOWN DISPOSABLE) ×2 IMPLANT
GOWN STRL REUS W/TWL LRG LVL3 (GOWN DISPOSABLE) ×2
LIQUID BAND (GAUZE/BANDAGES/DRESSINGS) ×2 IMPLANT
NS IRRIG 1000ML POUR BTL (IV SOLUTION) ×2 IMPLANT
PACK BASIN DAY SURGERY FS (CUSTOM PROCEDURE TRAY) ×2 IMPLANT
PENCIL BUTTON HOLSTER BLD 10FT (ELECTRODE) ×2 IMPLANT
PIN SAFETY STERILE (MISCELLANEOUS) ×2 IMPLANT
SHEET MEDIUM DRAPE 40X70 STRL (DRAPES) ×2 IMPLANT
SLEEVE SCD COMPRESS KNEE MED (MISCELLANEOUS) ×2 IMPLANT
SPONGE LAP 18X18 X RAY DECT (DISPOSABLE) ×8 IMPLANT
STAPLER VISISTAT 35W (STAPLE) ×4 IMPLANT
SUT ETHILON 2 0 FS 18 (SUTURE) ×4 IMPLANT
SUT MNCRL AB 4-0 PS2 18 (SUTURE) ×8 IMPLANT
SUT VIC AB 3-0 PS1 18 (SUTURE) ×6
SUT VIC AB 3-0 PS1 18XBRD (SUTURE) ×6 IMPLANT
SUT VICRYL 4-0 PS2 18IN ABS (SUTURE) ×4 IMPLANT
SYR BULB IRRIGATION 50ML (SYRINGE) ×2 IMPLANT
TAPE MEASURE VINYL STERILE (MISCELLANEOUS) IMPLANT
TOWEL OR 17X24 6PK STRL BLUE (TOWEL DISPOSABLE) ×4 IMPLANT
TUBE CONNECTING 20X1/4 (TUBING) ×2 IMPLANT
UNDERPAD 30X30 (UNDERPADS AND DIAPERS) ×4 IMPLANT
YANKAUER SUCT BULB TIP NO VENT (SUCTIONS) ×2 IMPLANT

## 2016-01-03 NOTE — Transfer of Care (Signed)
Immediate Anesthesia Transfer of Care Note  Patient: Marie Pierce  Procedure(s) Performed: Procedure(s) with comments: MAMMARY REDUCTION  (BREAST) (Bilateral) - MAMMARY REDUCTION  (BREAST)  Patient Location: PACU  Anesthesia Type:General  Level of Consciousness: awake and sedated  Airway & Oxygen Therapy: Patient Spontanous Breathing and Patient connected to face mask oxygen  Post-op Assessment: Report given to RN and Post -op Vital signs reviewed and stable  Post vital signs: Reviewed and stable  Last Vitals:  Vitals:   01/03/16 0933 01/03/16 1402  BP: 132/77 (!) 153/90  Pulse: 67 83  Resp: 18 (!) 6  Temp: 36.8 C     Last Pain:  Vitals:   01/03/16 0933  TempSrc: Oral         Complications: No apparent anesthesia complications

## 2016-01-03 NOTE — Discharge Instructions (Signed)
Post Anesthesia Home Care Instructions  Activity: Get plenty of rest for the remainder of the day. A responsible adult should stay with you for 24 hours following the procedure.  For the next 24 hours, DO NOT: -Drive a car -Paediatric nurse -Drink alcoholic beverages -Take any medication unless instructed by your physician -Make any legal decisions or sign important papers.  Meals: Start with liquid foods such as gelatin or soup. Progress to regular foods as tolerated. Avoid greasy, spicy, heavy foods. If nausea and/or vomiting occur, drink only clear liquids until the nausea and/or vomiting subsides. Call your physician if vomiting continues.  Special Instructions/Symptoms: Your throat may feel dry or sore from the anesthesia or the breathing tube placed in your throat during surgery. If this causes discomfort, gargle with warm salt water. The discomfort should disappear within 24 hours.  If you had a scopolamine patch placed behind your ear for the management of post- operative nausea and/or vomiting:  1. The medication in the patch is effective for 72 hours, after which it should be removed.  Wrap patch in a tissue and discard in the trash. Wash hands thoroughly with soap and water. 2. You may remove the patch earlier than 72 hours if you experience unpleasant side effects which may include dry mouth, dizziness or visual disturbances. 3. Avoid touching the patch. Wash your hands with soap and water after contact with the patch.   Call your surgeon if you experience:   1.  Fever over 101.0. 2.  Inability to urinate. 3.  Nausea and/or vomiting. 4.  Extreme swelling or bruising at the surgical site. 5.  Continued bleeding from the incision. 6.  Increased pain, redness or drainage from the incision. 7.  Problems related to your pain medication. 8.  Any problems and/or concerns    JP Drain Totals  Bring this sheet to all of your post-operative appointments while you have your  drains.  Please measure your drains by CC's or ML's.  Make sure you drain and measure your JP Drains 2 or 3 times per day.  At the end of each day, add up totals for the left side and add up totals for the right side.    ( 9 am )     ( 3 pm )        ( 9 pm )                Date L  R  L  R  L  R  Total L/R  Information for Discharge Teaching: EXPAREL (bupivacaine liposome injectable suspension)   Your surgeon gave you EXPAREL(bupivacaine) in your surgical incision to help control your pain after surgery.   EXPAREL is a local anesthetic that provides pain relief by numbing the tissue around the surgical site.  EXPAREL is designed to release pain medication over time and can control pain for up to 72 hours.  Depending on how you respond to EXPAREL, you may require less pain medication during your recovery.  Possible side effects:  Temporary loss of sensation or ability to move in the area where bupivacaine was injected.  Nausea, vomiting, constipation  Rarely, numbness and tingling in your mouth or lips, lightheadedness, or anxiety may occur.  Call your doctor right away if you think you may be experiencing any of these sensations, or if you have other questions regarding possible side effects.  Follow all other discharge instructions given to you by your surgeon or nurse. Eat a healthy diet and drink plenty of water or other fluids.  If you return to the hospital for any reason within 96 hours following the administration of EXPAREL, please inform your health care providers.About my Jackson-Pratt Bulb Drain  What is a Jackson-Pratt bulb? A Jackson-Pratt is a soft, round device used to collect drainage. It is connected to a long, thin drainage catheter, which is held in place by one or two  small stiches near your surgical incision site. When the bulb is squeezed, it forms a vacuum, forcing the drainage to empty into the bulb.  Emptying the Jackson-Pratt bulb- To empty the bulb: 1. Release the plug on the top of the bulb. 2. Pour the bulb's contents into a measuring container which your nurse will provide. 3. Record the time emptied and amount of drainage. Empty the drain(s) as often as your     doctor or nurse recommends.  Date                  Time                    Amount (Drain 1)                 Amount (Drain 2)  _____________________________________________________________________  _____________________________________________________________________  _____________________________________________________________________  _____________________________________________________________________  _____________________________________________________________________  _____________________________________________________________________  _____________________________________________________________________  _____________________________________________________________________  Squeezing the Jackson-Pratt Bulb- To squeeze the bulb: 1. Make sure the plug at the top of the bulb is open. 2. Squeeze the bulb tightly in your fist. You will hear air squeezing from the bulb. 3. Replace the plug while the bulb is squeezed. 4. Use a safety pin to attach the bulb to your clothing. This will keep the catheter from     pulling at the bulb insertion site.  When to call your doctor- Call your doctor if:  Drain site becomes red, swollen or hot.  You have a fever greater than 101 degrees F.  There is oozing at the drain site.  Drain falls out (apply a guaze bandage over the drain hole and secure it with tape).  Drainage increases daily not related to activity patterns. (You will usually have more drainage when you are active than when you are resting.)  Drainage has a bad  odor.

## 2016-01-03 NOTE — Anesthesia Preprocedure Evaluation (Addendum)
Anesthesia Evaluation  Patient identified by MRN, date of birth, ID band Patient awake    Reviewed: Allergy & Precautions, NPO status , Patient's Chart, lab work & pertinent test results  History of Anesthesia Complications (+) PONV  Airway Mallampati: II  TM Distance: >3 FB Neck ROM: Full    Dental  (+) Dental Advisory Given   Pulmonary neg pulmonary ROS,    breath sounds clear to auscultation       Cardiovascular + dysrhythmias  Rhythm:Regular Rate:Normal     Neuro/Psych negative neurological ROS     GI/Hepatic Neg liver ROS, PUD, GERD  ,  Endo/Other    Renal/GU negative Renal ROS     Musculoskeletal  (+) Arthritis ,   Abdominal   Peds  Hematology   Anesthesia Other Findings   Reproductive/Obstetrics                            Lab Results  Component Value Date   WBC 4.8 11/21/2015   HGB 14.0 11/21/2015   HCT 42.0 11/21/2015   MCV 97.2 11/21/2015   PLT 150 11/21/2015   Lab Results  Component Value Date   CREATININE 0.67 11/21/2015   BUN 10 11/21/2015   NA 141 11/21/2015   K 4.0 11/21/2015   CL 109 11/21/2015   CO2 26 11/21/2015    Anesthesia Physical  Anesthesia Plan  ASA: II  Anesthesia Plan: General   Post-op Pain Management:    Induction: Intravenous  Airway Management Planned: Oral ETT  Additional Equipment:   Intra-op Plan:   Post-operative Plan: Extubation in OR  Informed Consent: I have reviewed the patients History and Physical, chart, labs and discussed the procedure including the risks, benefits and alternatives for the proposed anesthesia with the patient or authorized representative who has indicated his/her understanding and acceptance.   Dental advisory given  Plan Discussed with: CRNA and Anesthesiologist  Anesthesia Plan Comments: (History of ?VT with induction/intubation during anesthesia and PONV. Plan for TIVA. Pt on pre-op beta blockers  and took last night. Routine monitors, and GETA.)      Anesthesia Quick Evaluation

## 2016-01-03 NOTE — Anesthesia Procedure Notes (Signed)
Performed by: Terrance Mass Airway Equipment and Method: LTA kit utilized

## 2016-01-03 NOTE — Anesthesia Procedure Notes (Signed)
Procedure Name: Intubation Performed by: Terrance Mass Pre-anesthesia Checklist: Patient identified, Emergency Drugs available, Suction available and Patient being monitored Patient Re-evaluated:Patient Re-evaluated prior to inductionOxygen Delivery Method: Circle system utilized Preoxygenation: Pre-oxygenation with 100% oxygen Intubation Type: IV induction Ventilation: Mask ventilation without difficulty Laryngoscope Size: Miller and 2 Grade View: Grade I Tube type: Oral Tube size: 7.0 mm Number of attempts: 1 Airway Equipment and Method: Stylet Placement Confirmation: ETT inserted through vocal cords under direct vision,  positive ETCO2 and breath sounds checked- equal and bilateral Secured at: 21 cm Tube secured with: Tape Dental Injury: Teeth and Oropharynx as per pre-operative assessment

## 2016-01-03 NOTE — Anesthesia Postprocedure Evaluation (Signed)
Anesthesia Post Note  Patient: Marie Pierce  Procedure(s) Performed: Procedure(s) (LRB): MAMMARY REDUCTION  (BREAST) (Bilateral)  Patient location during evaluation: PACU Anesthesia Type: General Level of consciousness: awake and alert Pain management: pain level controlled Vital Signs Assessment: post-procedure vital signs reviewed and stable Respiratory status: spontaneous breathing, nonlabored ventilation, respiratory function stable and patient connected to nasal cannula oxygen Cardiovascular status: blood pressure returned to baseline and stable Postop Assessment: no signs of nausea or vomiting Anesthetic complications: no       Last Vitals:  Vitals:   01/03/16 1430 01/03/16 1445  BP: 137/80 140/81  Pulse: 73 80  Resp: 16 13  Temp:      Last Pain:  Vitals:   01/03/16 1445  TempSrc:   PainSc: 3                  Tiajuana Amass

## 2016-01-03 NOTE — H&P (Signed)
Subjective:   Patient ID: Marie Pierce a 60 y.o.female.  HPI  Here for bilateral breast reduction. Her surgery in November 2017 was aborted due to arrythmia upon induction. She has had cardiac work up at this point and she was recommended to have beta blocker prior to surgery. She has also had recent sinus infection, no fevers or productive cough at present.  Presented following screening MMG with distortion right breast. Ultimately had right lumpectomy with SLN and final pathology pT1c pN0, stage IA IDC, ER/PR +, Her 2 negative. On adjuvant tamoxifen. Completed adjuvant radiation 10/15/14  Most recent imaging screening MMG 06/2015, noted to have distortion UOQ and underwent biopsy demonstrating fat necrosis.   Reports she has always felt breasts too large for her and has had consultation for reduction in remote past. Complains of several year history neck and back pain. Denies numbness of hands. Notes she had had yeast rashes when using underwire bra that required prescription antifungal cream. Has tried muscle relaxants, ibuprofen and heat/ice packs for back pain without relief. Continues to see chiropractor for neck and shoulder pain, who has counseled her that not sure if breasts are source of her pain but that all of that provider's patients that have a reduction feel better.   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  Current 40 DD bra, using same bra post lumpectomy but reports right not filling bra and left too tight.     Objective:  Physical Exam Cardiovascular: Normal rateand regular rhythm.  Pulmonary/Chest: Effort normaland breath sounds normal.  Lymphadenopathy:  She has no axillary adenopathy.   Right< left volume, transverse scar right upper pole, radial scar UOQ left breast and left periareolar No masses but firm in area of lumpectomy scar Sn to nipple R 28 L 31 cm BW R 18 L 18 cm Nipple to IMF R 12 L 14 cm +  shoulder grooving, + kyphosis  Assessment:   History lumpectomy, radiation Breast asymmetry acquired Macromastia Chronic neck and back pain  Plan:   Reviewed reduction with anchor type scars, drains.  Notes nausea with anesthesia and Rx for Zofran given previously. She has hydrocodone at home and no additional Rx given.Diminished sensation nipple and breast skin, risk of nipple loss, wound healing problems, asymmetry, incidental carcinoma, changes with wt gain/loss, aging, unacceptable cosmetic appearance reviewed. Encouraged to complete wt loss prior to any surgery. Discussed that radiation history on right will increase her risks of wound healing problems. Discussed that this surgery does not increase or reduce risk cancer or recurrence, but can produce changes to imaging and exams such as she has experienced post lumpectomy- fat necrosis, firmness- and additional such as cysts and calcifications. We discussed I will send lumpectomy area as separate specimen and re clip cavity if this area is included in reduction.   Anticipate 385 g reduction from each breast.   Irene Limbo, MD [facility] Plastic & Reconstructive Surgery [phone], pin [phone]

## 2016-01-03 NOTE — Op Note (Signed)
Operative Note   DATE OF OPERATION: 12.29.17  LOCATION: [facility]-outpatient  SURGICAL DIVISION: Plastic Surgery  PREOPERATIVE DIAGNOSES:  1. Macromastia 2. Chronic neck and back pain 3. History right breast cancer 4. Acquired asymmetry breasts 5.History therapeutic radiation  POSTOPERATIVE DIAGNOSES:  same  PROCEDURE:  Bilateral breast reduction  SURGEON: Irene Limbo MD MBA  ASSISTANT: none  ANESTHESIA:  General.   EBL: 123XX123 ml  COMPLICATIONS: None immediate.   INDICATIONS FOR PROCEDURE:  The patient, Marie Pierce, is a 60 y.o. female born on [birth year], is here for bilateral breast reduction. She has history of prior right lumpectomy at 12 o clock and adjuvant radiation.   FINDINGS: Left breast reduction 352 g Right breast reduction total weight resected 158 g, including right lumpectomy cavity  (21 g)which was sent as separate specimen.  DESCRIPTION OF PROCEDURE:  The patient was marked standing in the preoperative area to mark sternal notch, chest midline, anterior axillary lines, inframammary folds. The location of new nipple areolar complex was marked at level of on iframammary fold on anterior surface breast by palpation. This was marked symmetric over bilateral breasts. With aid of Wise pattern marker, location of new nipple areolar complex and vertical limbs (8 cm) were marked. The patient was taken to the operating room. SCDs were placed and IV antibiotics were given. The patient's operative site was prepped and draped in a sterile fashion. A time out was performed and all information was confirmed to be correct.   Over left breast, superomedial pedicle marked and nipple areolar complex marked with 50 mm diameter marker. Pedicle deepithlialized and developed 5-6 cm thickness and dissected toward chest wall until tension free rotation of pedicle achieved. Inferior pole breast tissue resected. Medial and lateral flaps developed. Breast tailor tacked  closed.  I then directed attention to right breast where superomedial pedicle designed. The pedicle was deepithelialized and developed to chest wall. Inferior pole breast tissue resected. Medial and lateral flaps developed. Additional tissue of superior pole excised including entirety of lumpectomy cavity. The lumpectomy cavity was sent as separate specimen and additional clips placed in breast to mark area. Skin and soft tissue flaps developed until able to be redraped over pedicle without tension. Patient brought to upright sitting position and assessed for symmetry. Patent returned to supine position and additional breast tissue excised from left breast. Patient assessed for symmetry again in sitting position. She was then returned to supine and breast cavities irrigated and hemostasis obtained. Exparel infiltrated throughout both breasts. 15 Fr JP placed in each breast and secured with 2-0 nylon. Closure completed with 3-0 vicryl to approximate dermis along inframammary fold and vertical limb. NAC inset with 4-0 vicryl in dermis. Skin closure completed with 4-0 monocryl subcuticular throughout.Tissue adhesive applied.  The patient was allowed to wake from anesthesia, extubated and taken to the recovery room in satisfactory condition.   SPECIMENS: right and left breast tissue, right breast tissue-lumpectomy cavity  DRAINS: 15 Fr JP in right and left breast  Irene Limbo, MD [facility] Plastic & Reconstructive Surgery [phone], pin [phone]

## 2016-01-06 DIAGNOSIS — C4490 Unspecified malignant neoplasm of skin, unspecified: Secondary | ICD-10-CM

## 2016-01-06 HISTORY — DX: Unspecified malignant neoplasm of skin, unspecified: C44.90

## 2016-01-07 ENCOUNTER — Other Ambulatory Visit: Payer: Self-pay | Admitting: Oncology

## 2016-03-03 ENCOUNTER — Other Ambulatory Visit: Payer: Self-pay | Admitting: *Deleted

## 2016-03-03 MED ORDER — TAMOXIFEN CITRATE 20 MG PO TABS: 20.0000 mg | ORAL_TABLET | Freq: Every day | ORAL | 4 refills | Status: DC

## 2016-03-11 ENCOUNTER — Telehealth: Payer: Self-pay | Admitting: Oncology

## 2016-03-11 NOTE — Telephone Encounter (Signed)
sw pt to confirm 4/9 appt at 1130. Date given on LOS pt will be out of town for [place]

## 2016-03-24 ENCOUNTER — Ambulatory Visit: Payer: 59 | Admitting: Oncology

## 2016-04-10 ENCOUNTER — Encounter: Payer: Self-pay | Admitting: *Deleted

## 2016-04-13 ENCOUNTER — Ambulatory Visit: Payer: 59 | Admitting: Oncology

## 2016-04-14 ENCOUNTER — Other Ambulatory Visit: Payer: Self-pay | Admitting: Internal Medicine

## 2016-04-20 ENCOUNTER — Ambulatory Visit (HOSPITAL_BASED_OUTPATIENT_CLINIC_OR_DEPARTMENT_OTHER): Payer: 59 | Admitting: Oncology

## 2016-04-20 VITALS — BP 156/68 | HR 73 | Temp 98.7°F | Wt 161.5 lb

## 2016-04-20 DIAGNOSIS — C50411 Malignant neoplasm of upper-outer quadrant of right female breast: Secondary | ICD-10-CM

## 2016-04-20 DIAGNOSIS — Z170 Estrogen receptor positive status [ER+]: Secondary | ICD-10-CM | POA: Diagnosis not present

## 2016-04-20 DIAGNOSIS — Z79810 Long term (current) use of selective estrogen receptor modulators (SERMs): Secondary | ICD-10-CM

## 2016-04-20 MED ORDER — VENLAFAXINE HCL 37.5 MG PO TABS: 37.5000 mg | ORAL_TABLET | Freq: Every day | ORAL | 4 refills | Status: DC

## 2016-04-20 MED ORDER — TAMOXIFEN CITRATE 20 MG PO TABS: 20.0000 mg | ORAL_TABLET | Freq: Every day | ORAL | 4 refills | Status: DC

## 2016-04-20 NOTE — Progress Notes (Signed)
[facility]  Telephone:([phone] Fax:([phone]) [phone]     ID: [name] DOB: 06-Aug-1955  MR#: [id number]  HUT#:[id number]  Patient Care Team: Chauncey Cruel, MD as PCP - General (Oncology) Anastasio Auerbach, MD as Consulting Physician (Gynecology) [name], MD as Consulting Physician (General Surgery) Chauncey Cruel, MD as Consulting Physician (Oncology) Arloa Koh, MD as Consulting Physician (Radiation Oncology) Mauro Kaufmann, RN as Registered Nurse Rockwell Germany, RN as Registered Nurse Holley Bouche, NP as Nurse Practitioner (Nurse Practitioner) Sylvan Cheese, NP as Nurse Practitioner (Hematology and Oncology) PCP: Chauncey Cruel, MD OTHER MD:  CHIEF COMPLAINT:  Estrogen receptor positive breast cancer  CURRENT TREATMENT:   tamoxifen   BREAST CANCER HISTORY:  from the original intake note:  "Marie Pierce" had bilateral screening mammography with tomography at the [facility] 06/18/2014. This showed a possible area of distortion in the right breast. On 06/25/2014 she was recalled for bilateral diagnostic mammography with tomography and right breast ultrasonography. The breast density was category C. In the right breast at 11:00 there was a persistent area of distortion which was not palpable by exam. Ultrasound confirmed a subtle hypoechoic mass in the area in question measuring 0.6 cm. Ultrasound of the right axilla was normal.  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  Her subsequent history is as detailed below.  INTERVAL HISTORY: Marie Pierce returns today for follow-up of her estrogen receptor positive breast cancer. The interval history is unremarkable. She is tolerating  tamoxifen well. Her hot flashes are well-controlled on venlafaxine. She did not do well on Neurontin, which made her feel mentally handicapped she says. Vaginal dryness or wetness is not an issue. She obtains a drug at a good cost  REVIEW OF SYSTEMS: She continues to work full-time and enjoys it. She had her reduction mammoplasty bilaterally and is very pleased with the results. She has the expected sensitivity and soreness and occasional zingers as she puts it from the surgery but understands that this is all benign. She is concerned by the finding of lobular carcinoma in situ and wonders what that means. Aside from that a detailed review of systems today was noncontributory  PAST MEDICAL HISTORY: Past Medical History:  Diagnosis Date  . Acute sinus infection 12/23/2015   finished antibiotic 12/31/2015  . Arthritis    multiple joints  . Breast asymmetry 12/2015  . Cough 01/01/2016  . Dental crowns present   . GERD (gastroesophageal reflux disease)   . History of breast cancer 2016  . History of gastric ulcer   . History of PAT (paroxysmal atrial tachycardia)    no current med.  [name] PONV (postoperative nausea and vomiting)   . S/P radiation therapy     PAST SURGICAL HISTORY: Past Surgical History:  Procedure Laterality Date  . BREAST BIOPSY Left    x 2  . BREAST BIOPSY Right   . BREAST REDUCTION SURGERY Bilateral 01/03/2016   Procedure: MAMMARY REDUCTION  (BREAST);  Surgeon: Irene Limbo, MD;  Location: [facility];  Service: Plastics;  Laterality: Bilateral;  MAMMARY REDUCTION  (BREAST)  . CARDIAC CATHETERIZATION N/A 11/21/2015   Procedure: Left Heart Cath and Coronary Angiography;  Surgeon: Troy Sine, MD;  Location: [facility];  Service: Cardiovascular;  Laterality: N/A;  . CESAREAN SECTION     x 2  . COLONOSCOPY WITH PROPOFOL  01/02/2015  . ESOPHAGOGASTRODUODENOSCOPY (EGD) WITH PROPOFOL  05/20/2011  . HEMORRHOID SURGERY    . PLANTAR FASCIA  SURGERY Left   . RADIOACTIVE SEED GUIDED MASTECTOMY WITH AXILLARY SENTINEL LYMPH NODE BIOPSY Right 07/17/2014   Procedure: RADIOACTIVE SEED GUIDED PARTIAL MASTECTOMY WITH AXILLARY SENTINEL LYMPH NODE BIOPSY;  Surgeon: [name], MD;  Location: [facility];  Service: General;  Laterality: Right;  . TONSILLECTOMY    . TOTAL ABDOMINAL HYSTERECTOMY W/ BILATERAL SALPINGOOPHORECTOMY  09/05/2003    FAMILY HISTORY Family History  Problem Relation Age of Onset  . Colon cancer Father 60    and dx. again at 89  . Stomach cancer Maternal Aunt 60  . Diabetes Mother   . Heart disease Mother   . Breast cancer Paternal Aunt     dx. 56s  . Breast cancer Maternal Grandmother 62  . Skin cancer Brother     not melanoma  . Colon polyps Brother     1 polyp on colonoscopy  . Breast cancer Maternal Aunt 52  . Lung cancer Maternal Uncle     smoker  . Lung cancer Maternal Uncle     smoker  . Brain cancer Maternal Uncle     tumor type unknown  . Breast cancer Cousin     underwent BL mastectomies  . Leukemia Cousin 31   The patient's parents are still living, in their early 54s per the patient had 2 brothers, no sisters. The patient's father had colon cancer at age 41. One maternal aunt (out of 54) and the maternal grandmother were both diagnosed with breast cancer over the age of 90.  GYNECOLOGIC HISTORY:  No LMP recorded. Patient has had a hysterectomy.  menarche age 45, first live birth at 12, which increases the risk of breast cancer developing. The patient is GX P2. She is  Status post TAH/BSO and did not take hormone replacement. 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 Her husband Marie Pierce  Is a Chief Strategy Officer. Daughter Marie Pierce is a Pharmacist, hospital in [place] son Marie Pierce is studying at [employer] (Event organiser).    ADVANCED DIRECTIVES:  Not  in place   HEALTH MAINTENANCE: Social History  Substance Use Topics  . Smoking status: Never Smoker  . Smokeless tobacco: Never Used  . Alcohol use Yes     Comment: occasionally     Colonoscopy: scheduled DEC 2016/ [facility]  PAP:  Bone density:  Lipid panel:  Allergies  Allergen Reactions  . Morphine And Related Hives  . Penicillins Swelling    SWELLING OF THROAT  . Sulfa Antibiotics Hives  . Ultram [Tramadol Hcl] Nausea And Vomiting and Other (See Comments)    DIZZINESS    Current Outpatient Prescriptions  Medication Sig Dispense Refill  . esomeprazole (NEXIUM) 40 MG capsule Take 1 capsule (40 mg total) by mouth daily before breakfast. 30 capsule 6  . esomeprazole (NEXIUM) 40 MG capsule TAKE ONE CAPSULE BY MOUTH EVERY DAY BEFORE BREAKFAST 90 capsule 1  . HYDROcodone-acetaminophen (NORCO) 5-325 MG tablet Take 1-2 tablets by mouth every 4 (four) hours as needed for moderate pain. 40 tablet 0  . nebivolol (BYSTOLIC) 5 MG tablet Take 5 mg by mouth daily. TO TAKE PM 12/27 AND 01/02/2016    .  tamoxifen (NOLVADEX) 20 MG tablet Take 1 tablet (20 mg total) by mouth daily. 90 tablet 4  . venlafaxine (EFFEXOR) 37.5 MG tablet Take 37.5 mg by mouth daily.     No current facility-administered medications for this visit.     OBJECTIVE:  Middle-aged white woman Who appears well  Vitals:   04/20/16 0918  BP: (!) 156/68  Pulse: 73  Temp: 98.7 F (37.1 C)     Body mass index is 29.54 kg/m.    ECOG FS:0 - Asymptomatic  Sclerae unicteric, EOMs intact Oropharynx clear and moist No cervical or supraclavicular adenopathy Lungs no rales or rhonchi Heart regular rate and rhythm Abd soft, nontender, positive bowel sounds MSK no focal spinal tenderness, no upper extremity lymphedema Neuro: nonfocal, well oriented, appropriate affect Breasts: The right breast is status post lumpectomy and radiation as well as reduction mammoplasty. There is no evidence of local recurrence. The left breast  is status post reduction mammoplasty. The overall cosmetic result is good. Both axillae are benign.   LAB RESULTS:  CMP     Component Value Date/Time   NA 141 11/21/2015 0538   NA 142 08/13/2015 1357   K 4.0 11/21/2015 0538   K 3.9 08/13/2015 1357   CL 109 11/21/2015 0538   CO2 26 11/21/2015 0538   CO2 27 08/13/2015 1357   GLUCOSE 104 (H) 11/21/2015 0538   GLUCOSE 128 08/13/2015 1357   BUN 10 11/21/2015 0538   BUN 8.3 08/13/2015 1357   CREATININE 0.67 11/21/2015 0538   CREATININE 0.8 08/13/2015 1357   CALCIUM 8.6 (L) 11/21/2015 0538   CALCIUM 8.9 08/13/2015 1357   PROT 4.8 (L) 11/19/2015 0949   PROT 6.3 (L) 08/13/2015 1357   ALBUMIN 3.1 (L) 11/19/2015 0949   ALBUMIN 3.7 08/13/2015 1357   AST 36 11/19/2015 0949   AST 34 08/13/2015 1357   ALT 45 11/19/2015 0949   ALT 60 (H) 08/13/2015 1357   ALKPHOS 64 11/19/2015 0949   ALKPHOS 87 08/13/2015 1357   BILITOT 0.6 11/19/2015 0949   BILITOT 0.33 08/13/2015 1357   GFRNONAA >60 11/21/2015 0538   GFRAA >60 11/21/2015 0538    INo results found for: SPEP, UPEP  Lab Results  Component Value Date   WBC 4.8 11/21/2015   NEUTROABS 3.0 08/13/2015   HGB 14.0 11/21/2015   HCT 42.0 11/21/2015   MCV 97.2 11/21/2015   PLT 150 11/21/2015      Chemistry      Component Value Date/Time   NA 141 11/21/2015 0538   NA 142 08/13/2015 1357   K 4.0 11/21/2015 0538   K 3.9 08/13/2015 1357   CL 109 11/21/2015 0538   CO2 26 11/21/2015 0538   CO2 27 08/13/2015 1357   BUN 10 11/21/2015 0538   BUN 8.3 08/13/2015 1357   CREATININE 0.67 11/21/2015 0538   CREATININE 0.8 08/13/2015 1357      Component Value Date/Time   CALCIUM 8.6 (L) 11/21/2015 0538   CALCIUM 8.9 08/13/2015 1357   ALKPHOS 64 11/19/2015 0949   ALKPHOS 87 08/13/2015 1357   AST 36 11/19/2015 0949   AST 34 08/13/2015 1357   ALT 45 11/19/2015 0949   ALT 60 (H) 08/13/2015 1357   BILITOT 0.6 11/19/2015 0949   BILITOT 0.33 08/13/2015 1357       No results found for:  LABCA2  No components found for: LABCA125  No results for input(s): INR in the last 168 hours.  Urinalysis No results found for: COLORURINE,  APPEARANCEUR, LABSPEC, PHURINE, GLUCOSEU, HGBUR, BILIRUBINUR, KETONESUR, PROTEINUR, UROBILINOGEN, NITRITE, LEUKOCYTESUR  STUDIES: We discussed the results of her mammoplasty pathology from 01/03/2016 ([id number])  ASSESSMENT: 61 y.o. [place] woman status post right breast upper outer quadrant biopsy 07/03/2014 for a clinical T1b N0, stage IA invasive ductal carcinoma, grade 1, estrogen and progesterone receptor positive, with an MIB-1 of 10% and HER-2 pending.    (1) breast conserving surgery with sentinel lymph node sampling  07/17/2014 showed a pT1c pN0, stage IA  Invasive ductal carcinoma, grade 1, with ample margins. Repeat HER-2 was again negative  (2) Oncotype sent from the definitive surgical sample showed a recurrence score of 17, predicting a risk of recurrence outside the breast within 10 years of 11% if the patient's only systemic therapy is tamoxifen for 5 years. Also predicts no benefit from chemotherapy.  (3) adjuvant radiation 09/05/2014 through 10/15/2014:  Right breast 4500 cGy in 25 sessions , right breast tumor bed boost 540 cGy in 3 sessions   (4)  Started tamoxifen 11/06/2014  (a)  Patient is status post remote TAH-BSO  PLAN: Marie Pierce Is now nearly 2 years out from definitive surgery for her breast cancer with no evidence of disease recurrence. This is very favorable  She is tolerating tamoxifen well. The plan is to continue that for a total of 5 years. 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 We rediscussed the fact that this is really not a cancer but more a  cancer risk marker. We then discussed what to do about cancer risk and the main thing she is doing is taking tamoxifen which reduces the risk from about 1% per year to about a half percent per year.  We consider going on yearly MRI but she is deterred appropriately by considerations of false positives.  She will have her next mammogram in June. She will see Dr. Barry Dienes in August. She will return to see me February of next year.  She knows to call for any problems that may develop before her next visit.     Chauncey Cruel, MD   04/20/2016 9:37 AM Medical Oncology and Hematology [facility] [address] [place], [place] [address] Tel. [phone]    Fax. [phone]

## 2016-07-10 ENCOUNTER — Other Ambulatory Visit: Payer: Self-pay | Admitting: General Surgery

## 2016-07-10 DIAGNOSIS — Z853 Personal history of malignant neoplasm of breast: Secondary | ICD-10-CM

## 2016-07-10 DIAGNOSIS — Z9889 Other specified postprocedural states: Secondary | ICD-10-CM

## 2016-07-13 ENCOUNTER — Ambulatory Visit
Admission: RE | Admit: 2016-07-13 | Discharge: 2016-07-13 | Disposition: A | Discharge status: Home / Self Care | Payer: 59 | Source: Ambulatory Visit | Attending: General Surgery | Admitting: General Surgery

## 2016-07-13 ENCOUNTER — Other Ambulatory Visit: Payer: Self-pay | Admitting: General Surgery

## 2016-07-13 DIAGNOSIS — Z9889 Other specified postprocedural states: Secondary | ICD-10-CM

## 2016-07-13 DIAGNOSIS — Z853 Personal history of malignant neoplasm of breast: Secondary | ICD-10-CM

## 2016-07-13 HISTORY — DX: Personal history of irradiation: Z92.3

## 2016-08-05 ENCOUNTER — Other Ambulatory Visit: Payer: 59

## 2016-08-12 ENCOUNTER — Ambulatory Visit: Payer: 59 | Admitting: Oncology

## 2016-08-27 ENCOUNTER — Other Ambulatory Visit: Payer: Self-pay | Admitting: Oncology

## 2016-09-12 ENCOUNTER — Other Ambulatory Visit: Payer: Self-pay | Admitting: Oncology

## 2016-11-15 ENCOUNTER — Other Ambulatory Visit: Payer: Self-pay | Admitting: Internal Medicine

## 2016-12-28 IMAGING — MG MM DIGITAL SCREENING BILAT W/ TOMO W/ CAD
12 series · 12 of 28 positions shown · non-contrast
Comparison: Previous exam(s).

CLINICAL DATA: Screening.

EXAM:
DIGITAL SCREENING BILATERAL MAMMOGRAM WITH 3D TOMO WITH CAD

[R MLO]
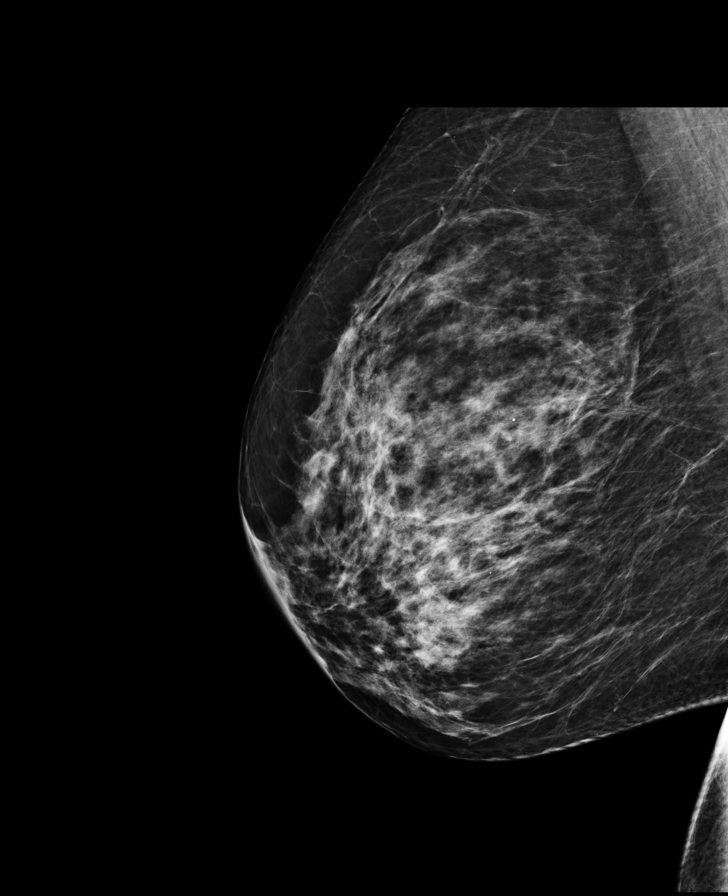

[L MLO]
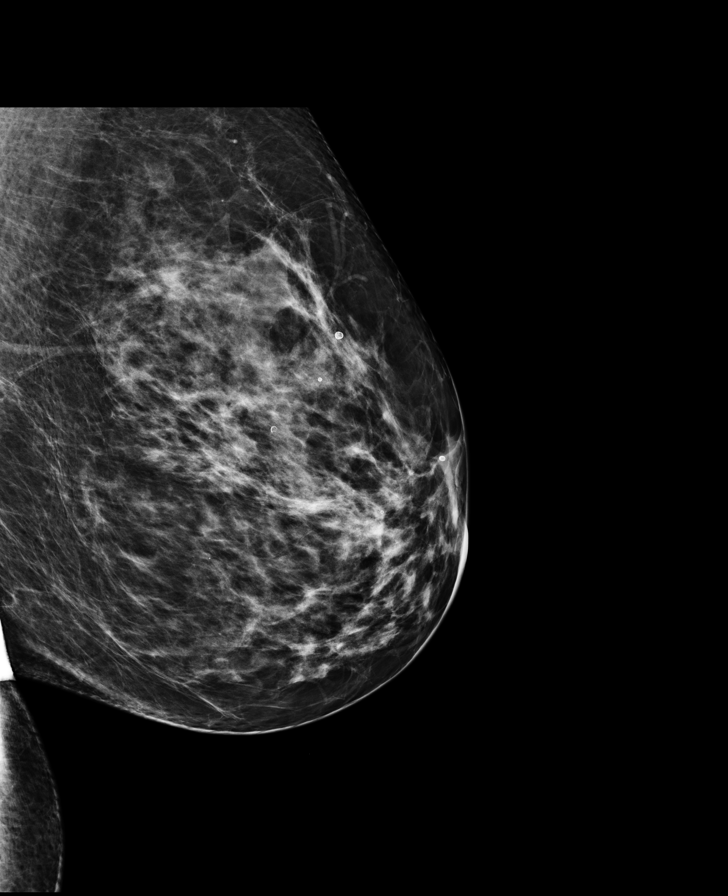

[L CC]
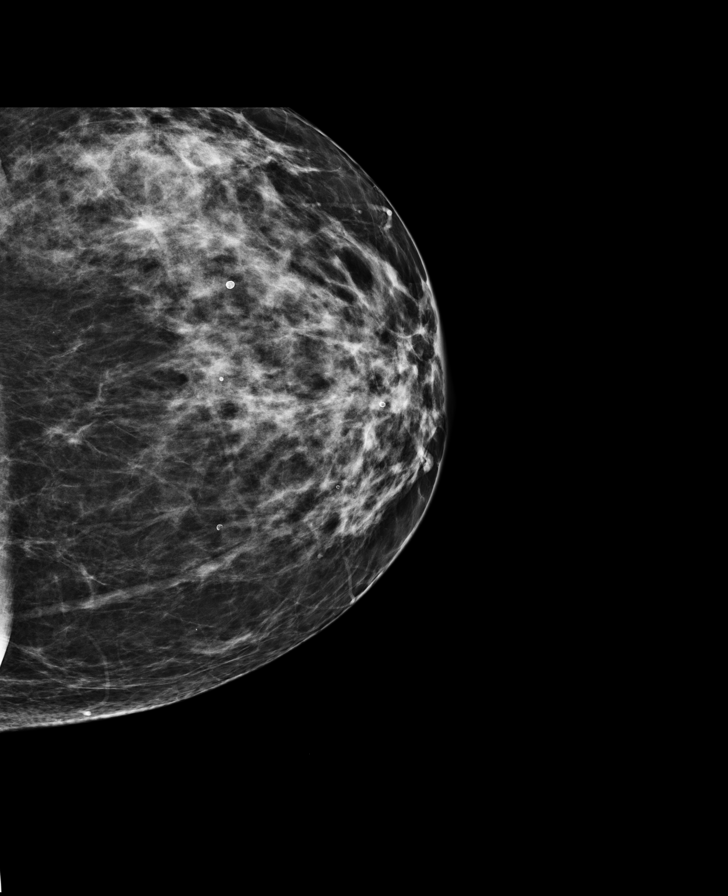

[R CC]
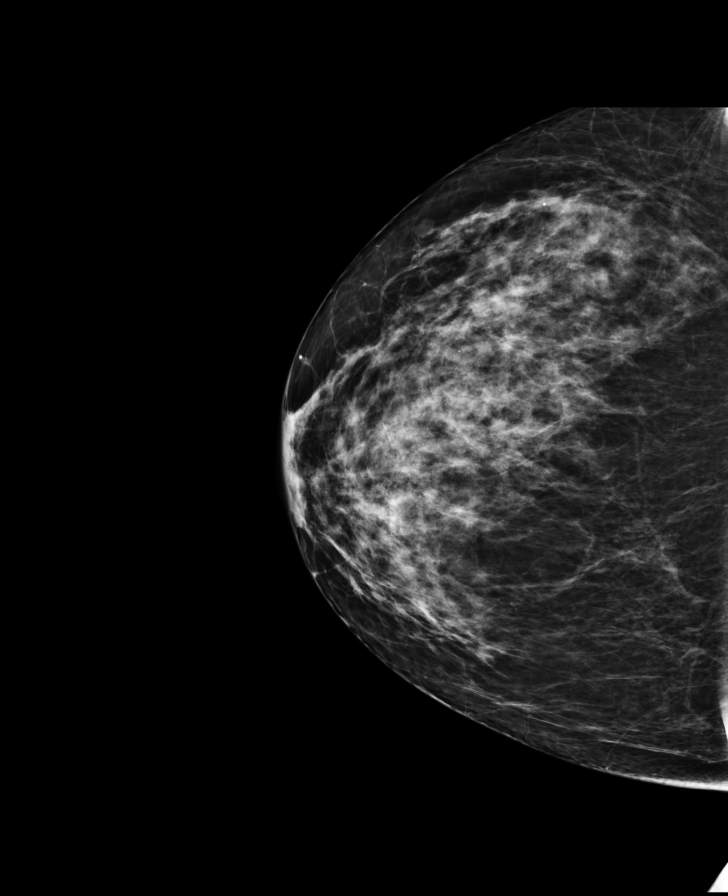

[L CC tomo (1 of 2)]
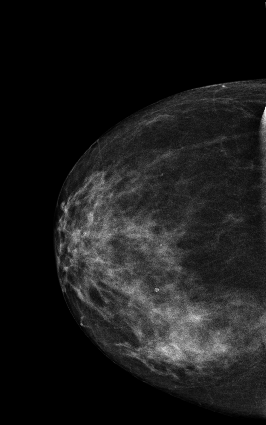

[R CC tomo (1 of 2)]
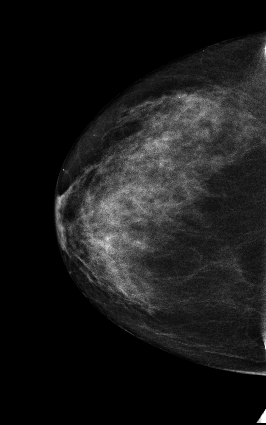

[L MLO tomo (1 of 2)]
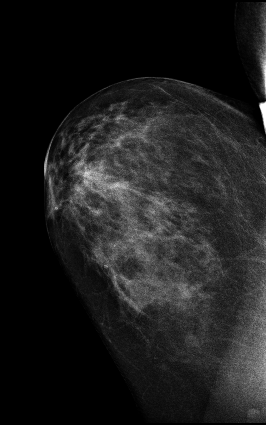

[R MLO tomo (1 of 2)]
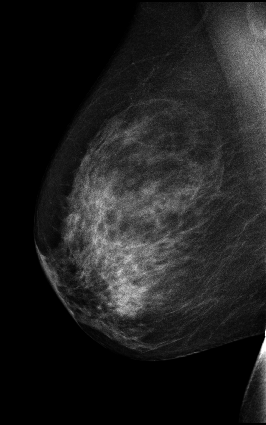

[L CC tomo (2 of 2) · tomo slice 40/79.0]
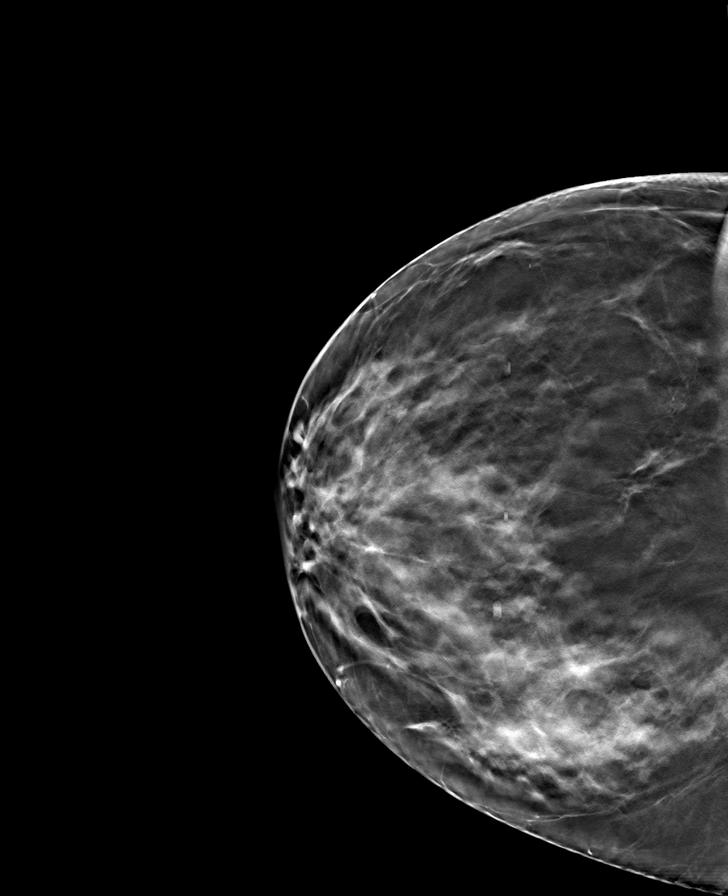

[R MLO tomo (2 of 2) · tomo slice 45/88.0]
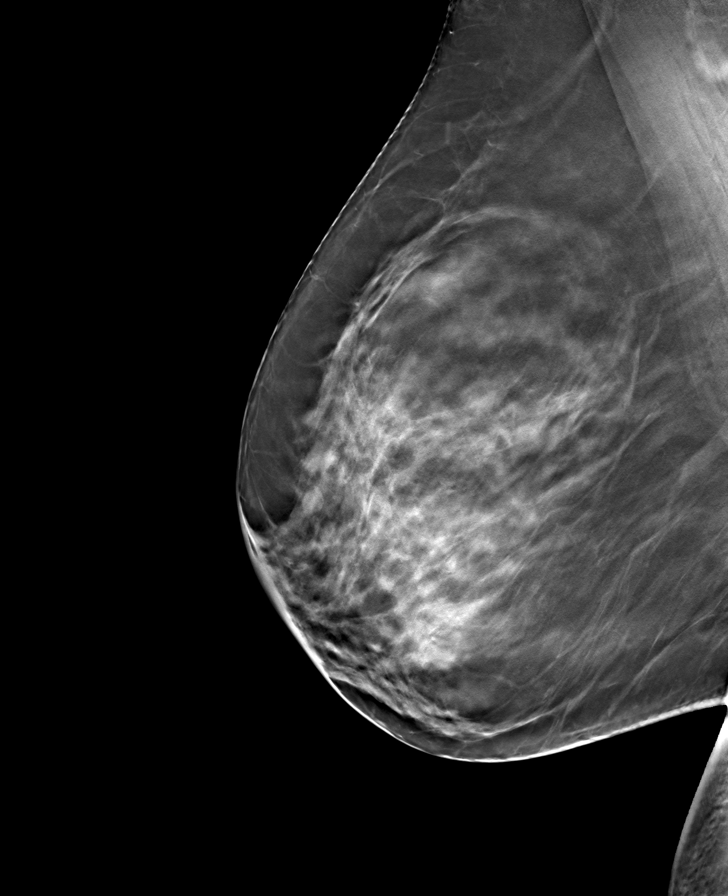

[R CC tomo (2 of 2) · tomo slice 43/85.0]
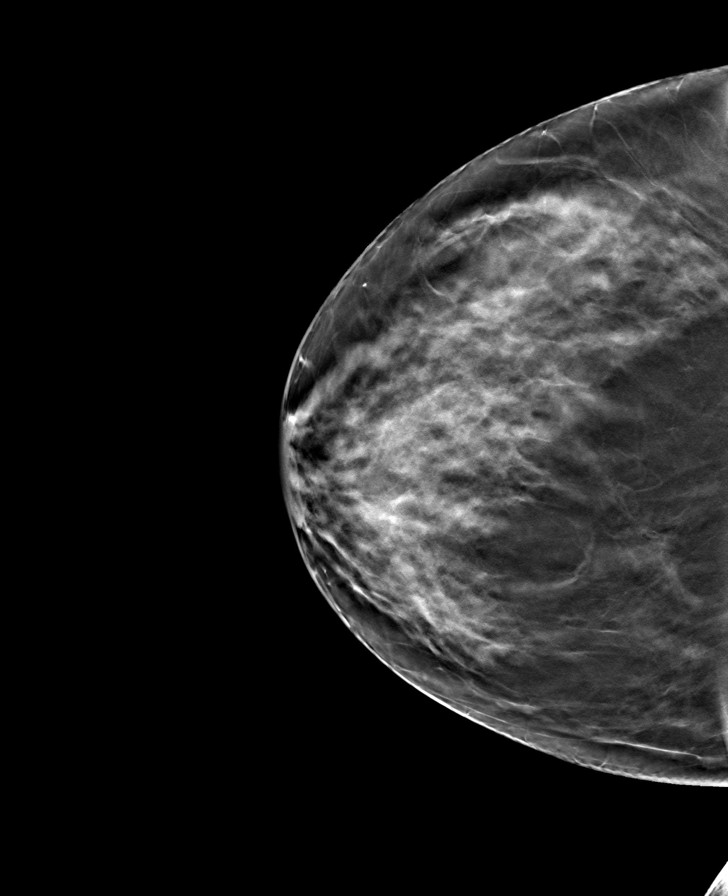

[L MLO tomo (2 of 2) · tomo slice 45/90.0]
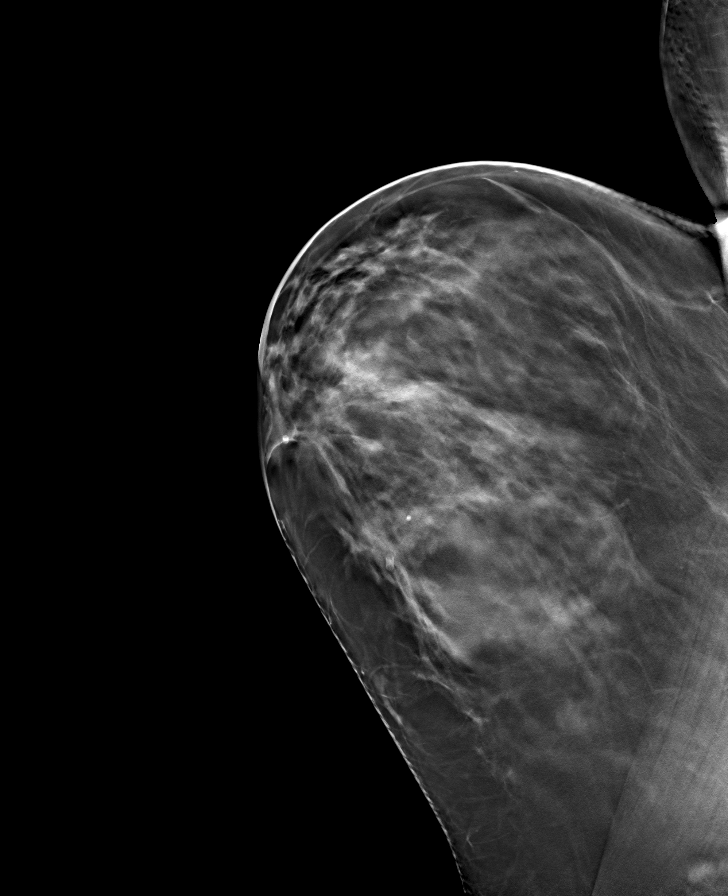

[12 of 28 positions shown; findings below may reference images not displayed]

ACR Breast Density Category c: The breast tissue is heterogeneously
dense, which may obscure small masses.
FINDINGS: In the right breast, possible distortion warrants further
evaluation. In the left breast, no findings suspicious for
malignancy. Images were processed with CAD.
IMPRESSION: Further evaluation is suggested for possible distortion in the right
breast.

RECOMMENDATION:
Diagnostic mammogram and possibly ultrasound of the right breast.
(Code:VA-X-SS3)

The patient will be contacted regarding the findings, and additional
imaging will be scheduled.

BI-RADS CATEGORY  0: Incomplete. Need additional imaging evaluation
and/or prior mammograms for comparison.

## 2017-02-11 ENCOUNTER — Telehealth: Payer: Self-pay | Admitting: Internal Medicine

## 2017-02-11 ENCOUNTER — Other Ambulatory Visit: Payer: Self-pay | Admitting: Internal Medicine

## 2017-02-12 MED ORDER — ESOMEPRAZOLE MAGNESIUM 40 MG PO CPDR: DELAYED_RELEASE_CAPSULE | ORAL | 1 refills | Status: DC

## 2017-02-12 NOTE — Telephone Encounter (Signed)
Lm I had refilled Nexium per her request.

## 2017-02-17 ENCOUNTER — Ambulatory Visit: Payer: Self-pay | Admitting: Oncology

## 2017-02-17 ENCOUNTER — Other Ambulatory Visit: Payer: 59

## 2017-02-17 NOTE — Progress Notes (Deleted)
[facility]  [phone] [phone]     ID: Marie Pierce DOB: July 20, 1955 (62 years old)  MR#: [id number]  YDX#:[id number]  Patient Care Team: Jana Hakim Virgie Dad, MD as PCP - General (Oncology) Phineas Real, Belinda Block, MD as Consulting Physician (Gynecology) [name], MD as Consulting Physician (General Surgery) Leopoldo Mazzie, Virgie Dad, MD as Consulting Physician (Oncology) Arloa Koh, MD as Consulting Physician (Radiation Oncology) Mauro Kaufmann, RN as Registered Nurse Rockwell Germany, RN as Registered Nurse Holley Bouche, NP as Nurse Practitioner (Nurse Practitioner) Sylvan Cheese, NP as Nurse Practitioner (Hematology and Oncology) PCP: Chauncey Cruel, MD OTHER MD:  CHIEF COMPLAINT:  Estrogen receptor positive breast cancer  CURRENT TREATMENT:   tamoxifen   BREAST CANCER HISTORY:  from the original intake note:  "Marie Pierce" had bilateral screening mammography with tomography at the breast Center 06/18/2014. (62 years old) This showed a possible area of distortion in the right breast. On 06/25/2014 she was recalled for bilateral diagnostic mammography with tomography and right breast ultrasonography. The breast density was category C. In the right breast at 11:00 there was a persistent area of distortion which was not palpable by exam. Ultrasound confirmed a subtle hypoechoic mass in the area in question measuring 0.6 cm. Ultrasound of the right axilla was normal.  On 07/03/2014 the patient underwent right breast upper outer quadrant biopsy, with the pathology ([id number]) showing invasive ductal carcinoma, grade 1, estrogen receptor 100% positive, progesterone receptor 20% positive, both with strong staining intensity, with an MIB-1 of 10%, and HER-2 pending.  Her subsequent history is as detailed below.  INTERVAL HISTORY: Marie Pierce returns today for follow-up of her estrogen receptor positive breast cancer. The interval history is unremarkable. She is  tolerating tamoxifen well. Her hot flashes are well-controlled on venlafaxine. She did not do well on Neurontin, which made her feel mentally handicapped she says. Vaginal dryness or wetness is not an issue. She obtains a drug at a good cost  REVIEW OF SYSTEMS: She continues to work full-time and enjoys it. She had her reduction mammoplasty bilaterally and is very pleased with the results. She has the expected sensitivity and soreness and occasional zingers as she puts it from the surgery but understands that this is all benign. She is concerned by the finding of lobular carcinoma in situ and wonders what that means. Aside from that a detailed review of systems today was noncontributory  PAST MEDICAL HISTORY: Past Medical History:  Diagnosis Date  . Acute sinus infection 12/23/2015   finished antibiotic 12/31/2015  . Arthritis    multiple joints  . Breast asymmetry 12/2015  . Breast cancer ([place])   . Cough 01/01/2016  . Dental crowns present   . GERD (gastroesophageal reflux disease)   . History of breast cancer 2016  . History of gastric ulcer   . History of PAT (paroxysmal atrial tachycardia)    no current med.  . Personal history of radiation therapy   . PONV (postoperative nausea and vomiting)   . S/P radiation therapy     PAST SURGICAL HISTORY: Past Surgical History:  Procedure Laterality Date  . BREAST BIOPSY Left    x 2  . BREAST BIOPSY Right   . BREAST LUMPECTOMY     right 2016  . BREAST REDUCTION SURGERY Bilateral 01/03/2016   Procedure: MAMMARY REDUCTION  (BREAST);  Surgeon: Irene Limbo, MD;  Location: [facility];  Service: Plastics;  Laterality: Bilateral;  MAMMARY REDUCTION  (BREAST)  . CARDIAC CATHETERIZATION N/A 11/21/2015  Procedure: Left Heart Cath and Coronary Angiography;  Surgeon: Troy Sine, MD;  Location: [facility];  Service: Cardiovascular;  Laterality: N/A;  . CESAREAN SECTION     x 2  . COLONOSCOPY WITH PROPOFOL   01/02/2015  . ESOPHAGOGASTRODUODENOSCOPY (EGD) WITH PROPOFOL  05/20/2011  . HEMORRHOID SURGERY    . PLANTAR FASCIA SURGERY Left   . RADIOACTIVE SEED GUIDED PARTIAL MASTECTOMY WITH AXILLARY SENTINEL LYMPH NODE BIOPSY Right 07/17/2014   Procedure: RADIOACTIVE SEED GUIDED PARTIAL MASTECTOMY WITH AXILLARY SENTINEL LYMPH NODE BIOPSY;  Surgeon: [name], MD;  Location: [facility];  Service: General;  Laterality: Right;  . REDUCTION MAMMAPLASTY     2017  . TONSILLECTOMY    . TOTAL ABDOMINAL HYSTERECTOMY W/ BILATERAL SALPINGOOPHORECTOMY  09/05/2003    FAMILY HISTORY Family History  Problem Relation Age of Onset  . Colon cancer Father 52       and dx. again at 5  . Stomach cancer Maternal Aunt 60  . Diabetes Mother   . Heart disease Mother   . Breast cancer Paternal Aunt        dx. 68s  . Breast cancer Maternal Grandmother 9  . Skin cancer Brother        not melanoma  . Colon polyps Brother        1 polyp on colonoscopy  . Breast cancer Maternal Aunt 52  . Lung cancer Maternal Uncle        smoker  . Lung cancer Maternal Uncle        smoker  . Brain cancer Maternal Uncle        tumor type unknown  . Breast cancer Cousin        underwent BL mastectomies  . Leukemia Cousin 41   The patient's parents are still living, in their early 36s per the patient had 2 brothers, no sisters. The patient's father had colon cancer at age 56. One maternal aunt (out of 74) and the maternal grandmother were both diagnosed with breast cancer over the age of 57.  GYNECOLOGIC HISTORY:  No LMP recorded. Patient has had a hysterectomy.  menarche age 41, first live birth at 23, which increases the risk of breast cancer developing. The patient is GX P2. She is  Status post TAH/BSO and did not take hormone replacement. 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 Her husband Marie Pierce  Is a Chief Strategy Officer. Daughter Marie Pierce is a Pharmacist, hospital in [place] son Marie Pierce is studying at [employer] (Event organiser).    ADVANCED DIRECTIVES:  Not in place   HEALTH MAINTENANCE: Social History   Tobacco Use  . Smoking status: Never Smoker  . Smokeless tobacco: Never Used  Substance Use Topics  . Alcohol use: Yes    Comment: occasionally  . Drug use: No     Colonoscopy: scheduled DEC 2016/ [facility]  PAP:  Bone density:  Lipid panel:  Allergies  Allergen Reactions  . Morphine And Related Hives  . Penicillins Swelling    SWELLING OF THROAT  . Sulfa Antibiotics Hives  . Ultram [Tramadol Hcl] Nausea And Vomiting and Other (See Comments)    DIZZINESS    Current Outpatient Medications  Medication Sig Dispense Refill  . esomeprazole (NEXIUM) 40 MG capsule TAKE ONE CAPSULE BY MOUTH EVERY DAY BEFORE BREAKFAST 90 capsule 1  . tamoxifen (  NOLVADEX) 20 MG tablet Take 1 tablet (20 mg total) by mouth daily. 90 tablet 4  . venlafaxine (EFFEXOR) 37.5 MG tablet Take 1 tablet (37.5 mg total) by mouth daily. 90 tablet 4  . venlafaxine XR (EFFEXOR-XR) 37.5 MG 24 hr capsule TAKE ONE CAPSULE BY MOUTH EVERY DAY WITH BREAKFAST 90 capsule 3   No current facility-administered medications for this visit.     OBJECTIVE:  Middle-aged white woman Who appears well  There were no vitals filed for this visit.   There is no height or weight on file to calculate BMI.    ECOG FS:0 - Asymptomatic  Sclerae unicteric, EOMs intact Oropharynx clear and moist No cervical or supraclavicular adenopathy Lungs no rales or rhonchi Heart regular rate and rhythm Abd soft, nontender, positive bowel sounds MSK no focal spinal tenderness, no upper extremity lymphedema Neuro: nonfocal, well oriented, appropriate affect Breasts: The right breast is status post lumpectomy and radiation as well as reduction mammoplasty. There is no evidence of local recurrence. The left breast is status  post reduction mammoplasty. The overall cosmetic result is good. Both axillae are benign.   LAB RESULTS:  CMP     Component Value Date/Time   NA 141 11/21/2015 0538   NA 142 08/13/2015 1357   K 4.0 11/21/2015 0538   K 3.9 08/13/2015 1357   CL 109 11/21/2015 0538   CO2 26 11/21/2015 0538   CO2 27 08/13/2015 1357   GLUCOSE 104 (H) 11/21/2015 0538   GLUCOSE 128 08/13/2015 1357   BUN 10 11/21/2015 0538   BUN 8.3 08/13/2015 1357   CREATININE 0.67 11/21/2015 0538   CREATININE 0.8 08/13/2015 1357   CALCIUM 8.6 (L) 11/21/2015 0538   CALCIUM 8.9 08/13/2015 1357   PROT 4.8 (L) 11/19/2015 0949   PROT 6.3 (L) 08/13/2015 1357   ALBUMIN 3.1 (L) 11/19/2015 0949   ALBUMIN 3.7 08/13/2015 1357   AST 36 11/19/2015 0949   AST 34 08/13/2015 1357   ALT 45 11/19/2015 0949   ALT 60 (H) 08/13/2015 1357   ALKPHOS 64 11/19/2015 0949   ALKPHOS 87 08/13/2015 1357   BILITOT 0.6 11/19/2015 0949   BILITOT 0.33 08/13/2015 1357   GFRNONAA >60 11/21/2015 0538   GFRAA >60 11/21/2015 0538    INo results found for: SPEP, UPEP  Lab Results  Component Value Date   WBC 4.8 11/21/2015   NEUTROABS 3.0 08/13/2015   HGB 14.0 11/21/2015   HCT 42.0 11/21/2015   MCV 97.2 11/21/2015   PLT 150 11/21/2015      Chemistry      Component Value Date/Time   NA 141 11/21/2015 0538   NA 142 08/13/2015 1357   K 4.0 11/21/2015 0538   K 3.9 08/13/2015 1357   CL 109 11/21/2015 0538   CO2 26 11/21/2015 0538   CO2 27 08/13/2015 1357   BUN 10 11/21/2015 0538   BUN 8.3 08/13/2015 1357   CREATININE 0.67 11/21/2015 0538   CREATININE 0.8 08/13/2015 1357      Component Value Date/Time   CALCIUM 8.6 (L) 11/21/2015 0538   CALCIUM 8.9 08/13/2015 1357   ALKPHOS 64 11/19/2015 0949   ALKPHOS 87 08/13/2015 1357   AST 36 11/19/2015 0949   AST 34 08/13/2015 1357   ALT 45 11/19/2015 0949   ALT 60 (H) 08/13/2015 1357   BILITOT 0.6 11/19/2015 0949   BILITOT 0.33 08/13/2015 1357       No results found for:  LABCA2  No components found for: [id number]  No results for input(s): INR in the last 168 hours.  Urinalysis No results found for: COLORURINE, APPEARANCEUR, LABSPEC, PHURINE, GLUCOSEU, HGBUR, BILIRUBINUR, KETONESUR, PROTEINUR, UROBILINOGEN, NITRITE, LEUKOCYTESUR  STUDIES: We discussed the results of her mammoplasty pathology from 01/03/2016 ([id number])  ASSESSMENT: 62 y.o. [place] woman status post right breast upper outer quadrant biopsy 07/03/2014 for a clinical T1b N0, stage IA invasive ductal carcinoma, grade 1, estrogen and progesterone receptor positive, with an MIB-1 of 10% and HER-2 pending.    (1) breast conserving surgery with sentinel lymph node sampling  07/17/2014 showed a pT1c pN0, stage IA  Invasive ductal carcinoma, grade 1, with ample margins. Repeat HER-2 was again negative  (2) Oncotype sent from the definitive surgical sample showed a recurrence score of 17, predicting a risk of recurrence outside the breast within 10 years of 11% if the patient's only systemic therapy is tamoxifen for 5 years. Also predicts no benefit from chemotherapy.  (3) adjuvant radiation 09/05/2014 through 10/15/2014:  Right breast 4500 cGy in 25 sessions , right breast tumor bed boost 540 cGy in 3 sessions   (4)  Started tamoxifen 11/06/2014  (a)  Patient is status post remote TAH-BSO  PLAN: Marie Pierce Is now nearly 2 years out from definitive surgery for her breast cancer with no evidence of disease recurrence. This is very favorable  She is tolerating tamoxifen well. The plan is to continue that for a total of 5 years. 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 We rediscussed the fact that this is really not a cancer but more a  cancer risk marker. We then discussed what to do about cancer risk and the main thing she is doing is taking tamoxifen which reduces the risk from about 1% per year to about a half percent per year.  We consider going on yearly MRI but she is deterred appropriately by considerations of false positives.  She will have her next mammogram in June. She will see Dr. Barry Dienes in August. She will return to see me February of next year.  She knows to call for any problems that may develop before her next visit.     Chauncey Cruel, MD   02/17/2017 4:42 PM Medical Oncology and Hematology [facility] [address] [place], [place] [address] Tel. [phone]    Fax. [phone]

## 2017-02-18 ENCOUNTER — Other Ambulatory Visit: Payer: 59

## 2017-02-18 ENCOUNTER — Ambulatory Visit: Payer: 59 | Admitting: Oncology

## 2017-03-03 NOTE — Progress Notes (Signed)
[place]  [phone] [phone]     ID: Marie Pierce DOB: Jun 19, 1955  MR#: [id number]  FAO#:[id number]  Patient Care Team: Jana Hakim Virgie Dad, MD as PCP - General (Oncology) Phineas Real, Belinda Block, MD as Consulting Physician (Gynecology) [name], MD as Consulting Physician (General Surgery) Magrinat, Virgie Dad, MD as Consulting Physician (Oncology) Arloa Koh, MD as Consulting Physician (Radiation Oncology) Mauro Kaufmann, RN as Registered Nurse Rockwell Germany, RN as Registered Nurse Holley Bouche, NP as Nurse Practitioner (Nurse Practitioner) Sylvan Cheese, NP as Nurse Practitioner (Hematology and Oncology) PCP: Chauncey Cruel, MD OTHER MD:  CHIEF COMPLAINT:  Estrogen receptor positive breast cancer  CURRENT TREATMENT:   tamoxifen   BREAST CANCER HISTORY:  from the original intake note:  "Marie Pierce" had bilateral screening mammography with tomography at the [facility] Center 06/18/2014. This showed a possible area of distortion in the right breast. On 06/25/2014 she was recalled for bilateral diagnostic mammography with tomography and right breast ultrasonography. The breast density was category C. In the right breast at 11:00 there was a persistent area of distortion which was not palpable by exam. Ultrasound confirmed a subtle hypoechoic mass in the area in question measuring 0.6 cm. Ultrasound of the right axilla was normal.  On 07/03/2014 the patient underwent right breast upper outer quadrant biopsy, with the pathology ([id number]) showing invasive ductal carcinoma, grade 1, estrogen receptor 100% positive, progesterone receptor 20% positive, both with strong staining intensity, with an MIB-1 of 10%, and HER-2 pending.  Her subsequent history is as detailed below.  INTERVAL HISTORY: Marie Pierce returns today for follow-up of her estrogen receptor positive breast cancer. She continues on tamoxifen, with good tolerance. She  notes some occasional hot flashes that aren't intense. She goes to bed hot.  Increased vaginal wetness is not a major issue for her.   Since her last visit, she underwent diagnostic bilateral mammography with CAD and tomography on 07/13/2017 at [facility] showing: breast density category C. There was no evidence of malignancy.    REVIEW OF SYSTEMS: Marie Pierce reports that she is going to [place], [place] where her in-laws are. She notes that she and her husband do not travel together, because they have a 11 year old dog that is expensive to travel with. [employer] recently joined the [employer] and she is the youngest in the senior citizen class. She doesn't call herself a senior citizen yet, but she is excited to retire next year. She notes that her daughter is expecting a baby this year. She denies unusual headaches, visual changes, nausea, vomiting, or dizziness. There has been no unusual cough, phlegm production, or pleurisy. This been no change in bowel or bladder habits. She denies unexplained fatigue or unexplained weight loss, bleeding, rash, or fever. A detailed review of systems was otherwise stable.    PAST MEDICAL HISTORY: Past Medical History:  Diagnosis Date  . Acute sinus infection 12/23/2015   finished antibiotic 12/31/2015  . Arthritis    multiple joints  . Breast asymmetry 12/2015  . Breast cancer ([place])   . Cough 01/01/2016  . Dental crowns present   . GERD (gastroesophageal reflux disease)   . History of breast cancer 2016  . History of gastric ulcer   . History of PAT (paroxysmal atrial tachycardia)    no current med.  . Personal history of radiation therapy   . PONV (postoperative nausea and vomiting)   . S/P radiation therapy     PAST  SURGICAL HISTORY: Past Surgical History:  Procedure Laterality Date  . BREAST BIOPSY Left    x 2  . BREAST BIOPSY Right   . BREAST LUMPECTOMY     right 2016  . BREAST REDUCTION SURGERY Bilateral 01/03/2016   Procedure: MAMMARY REDUCTION   (BREAST);  Surgeon: Irene Limbo, MD;  Location: [facility];  Service: Plastics;  Laterality: Bilateral;  MAMMARY REDUCTION  (BREAST)  . CARDIAC CATHETERIZATION N/A 11/21/2015   Procedure: Left Heart Cath and Coronary Angiography;  Surgeon: Troy Sine, MD;  Location: [facility];  Service: Cardiovascular;  Laterality: N/A;  . CESAREAN SECTION     x 2  . COLONOSCOPY WITH PROPOFOL  01/02/2015  . ESOPHAGOGASTRODUODENOSCOPY (EGD) WITH PROPOFOL  05/20/2011  . HEMORRHOID SURGERY    . PLANTAR FASCIA SURGERY Left   . RADIOACTIVE SEED GUIDED PARTIAL MASTECTOMY WITH AXILLARY SENTINEL LYMPH NODE BIOPSY Right 07/17/2014   Procedure: RADIOACTIVE SEED GUIDED PARTIAL MASTECTOMY WITH AXILLARY SENTINEL LYMPH NODE BIOPSY;  Surgeon: [name], MD;  Location: [facility];  Service: General;  Laterality: Right;  . REDUCTION MAMMAPLASTY     2017  . TONSILLECTOMY    . TOTAL ABDOMINAL HYSTERECTOMY W/ BILATERAL SALPINGOOPHORECTOMY  09/05/2003    FAMILY HISTORY Family History  Problem Relation Age of Onset  . Colon cancer Father 55       and dx. again at 80  . Stomach cancer Maternal Aunt 60  . Diabetes Mother   . Heart disease Mother   . Breast cancer Paternal Aunt        dx. 23s  . Breast cancer Maternal Grandmother 48  . Skin cancer Brother        not melanoma  . Colon polyps Brother        1 polyp on colonoscopy  . Breast cancer Maternal Aunt 52  . Lung cancer Maternal Uncle        smoker  . Lung cancer Maternal Uncle        smoker  . Brain cancer Maternal Uncle        tumor type unknown  . Breast cancer Cousin        underwent BL mastectomies  . Leukemia Cousin 88   The patient's parents are still living, in their early 39s per the patient had 2 brothers, no sisters. The patient's father had colon cancer at age 38. One maternal aunt (out of 36) and the maternal grandmother were both diagnosed with breast cancer over the age of 35.  GYNECOLOGIC  HISTORY:  No LMP recorded. Patient has had a hysterectomy.  menarche age 62, first live birth at 62, which increases the risk of breast cancer developing. The patient is GX P2. She is  Status post TAH/BSO and did not take hormone replacement. 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 Her husband Lasel "Skip" Dinneen  Is a Chief Strategy Officer. Daughter [name] is a Pharmacist, hospital in [place] son [name] is studying at [employer] (Event organiser). Marie Pierce is expecting her 1st grandchild in 2019.     ADVANCED DIRECTIVES:  Not in place   HEALTH MAINTENANCE: Social History   Tobacco Use  . Smoking status: Never Smoker  . Smokeless tobacco: Never Used  Substance Use Topics  . Alcohol use: Yes    Comment: occasionally  . Drug use: No  Colonoscopy: scheduled DEC 2016/ [facility]  PAP:  Bone density:  Lipid panel:  Allergies  Allergen Reactions  . Morphine And Related Hives  . Penicillins Swelling    SWELLING OF THROAT  . Sulfa Antibiotics Hives  . Ultram [Tramadol Hcl] Nausea And Vomiting and Other (See Comments)    DIZZINESS    Current Outpatient Medications  Medication Sig Dispense Refill  . esomeprazole (NEXIUM) 40 MG capsule TAKE ONE CAPSULE BY MOUTH EVERY DAY BEFORE BREAKFAST 90 capsule 1  . tamoxifen (NOLVADEX) 20 MG tablet Take 1 tablet (20 mg total) by mouth daily. 90 tablet 4  . venlafaxine (EFFEXOR) 37.5 MG tablet Take 1 tablet (37.5 mg total) by mouth daily. 90 tablet 4  . venlafaxine XR (EFFEXOR-XR) 37.5 MG 24 hr capsule TAKE ONE CAPSULE BY MOUTH EVERY DAY WITH BREAKFAST 90 capsule 3   No current facility-administered medications for this visit.     OBJECTIVE:  Middle-aged white woman in no acute distress  Vitals:   03/05/17 1120  BP: (!) 151/79  Pulse: 99  Resp: 18  Temp: 98.7 F (37.1 C)  SpO2: 97%     Body mass index is 32.04 kg/m.     ECOG FS:0 - Asymptomatic  Sclerae unicteric, pupils round and equal Oropharynx clear and moist No cervical or supraclavicular adenopathy Lungs no rales or rhonchi Heart regular rate and rhythm Abd soft, nontender, positive bowel sounds MSK no focal spinal tenderness, no upper extremity lymphedema Neuro: nonfocal, well oriented, appropriate affect Breasts: The right breast is undergone lumpectomy followed by radiation.  There is no evidence of disease recurrence.  Both axillae are benign.  LAB RESULTS:  CMP     Component Value Date/Time   NA 141 11/21/2015 0538   NA 142 08/13/2015 1357   K 4.0 11/21/2015 0538   K 3.9 08/13/2015 1357   CL 109 11/21/2015 0538   CO2 26 11/21/2015 0538   CO2 27 08/13/2015 1357   GLUCOSE 104 (H) 11/21/2015 0538   GLUCOSE 128 08/13/2015 1357   BUN 10 11/21/2015 0538   BUN 8.3 08/13/2015 1357   CREATININE 0.67 11/21/2015 0538   CREATININE 0.8 08/13/2015 1357   CALCIUM 8.6 (L) 11/21/2015 0538   CALCIUM 8.9 08/13/2015 1357   PROT 4.8 (L) 11/19/2015 0949   PROT 6.3 (L) 08/13/2015 1357   ALBUMIN 3.1 (L) 11/19/2015 0949   ALBUMIN 3.7 08/13/2015 1357   AST 36 11/19/2015 0949   AST 34 08/13/2015 1357   ALT 45 11/19/2015 0949   ALT 60 (H) 08/13/2015 1357   ALKPHOS 64 11/19/2015 0949   ALKPHOS 87 08/13/2015 1357   BILITOT 0.6 11/19/2015 0949   BILITOT 0.33 08/13/2015 1357   GFRNONAA >60 11/21/2015 0538   GFRAA >60 11/21/2015 0538    INo results found for: SPEP, UPEP  Lab Results  Component Value Date   WBC 4.7 03/05/2017   NEUTROABS 2.9 03/05/2017   HGB 14.2 03/05/2017   HCT 42.6 03/05/2017   MCV 96.7 03/05/2017   PLT 143 (L) 03/05/2017      Chemistry      Component Value Date/Time   NA 141 11/21/2015 0538   NA 142 08/13/2015 1357   K 4.0 11/21/2015 0538   K 3.9 08/13/2015 1357   CL 109 11/21/2015 0538   CO2 26 11/21/2015 0538   CO2 27 08/13/2015 1357   BUN 10 11/21/2015 0538   BUN 8.3 08/13/2015 1357   CREATININE 0.67  11/21/2015 0538   CREATININE 0.8 08/13/2015 1357  Component Value Date/Time   CALCIUM 8.6 (L) 11/21/2015 0538   CALCIUM 8.9 08/13/2015 1357   ALKPHOS 64 11/19/2015 0949   ALKPHOS 87 08/13/2015 1357   AST 36 11/19/2015 0949   AST 34 08/13/2015 1357   ALT 45 11/19/2015 0949   ALT 60 (H) 08/13/2015 1357   BILITOT 0.6 11/19/2015 0949   BILITOT 0.33 08/13/2015 1357       No results found for: LABCA2  No components found for: LABCA125  No results for input(s): INR in the last 168 hours.  Urinalysis No results found for: COLORURINE, APPEARANCEUR, LABSPEC, PHURINE, GLUCOSEU, HGBUR, BILIRUBINUR, KETONESUR, PROTEINUR, UROBILINOGEN, NITRITE, LEUKOCYTESUR  STUDIES: Since her last visit, she underwent diagnostic bilateral mammography with CAD and tomography on 07/13/2017 at [facility] showing: breast density category C. There was no evidence of malignancy.   ASSESSMENT: 62 y.o. [place] woman status post right breast upper outer quadrant biopsy 07/03/2014 for a clinical T1b N0, stage IA invasive ductal carcinoma, grade 1, estrogen and progesterone receptor positive, with an MIB-1 of 10% and HER-2 pending.    (1) breast conserving surgery with sentinel lymph node sampling  07/17/2014 showed a pT1c pN0, stage IA  Invasive ductal carcinoma, grade 1, with ample margins. Repeat HER-2 was again negative  (2) Oncotype sent from the definitive surgical sample showed a recurrence score of 17, predicting a risk of recurrence outside the breast within 10 years of 11% if the patient's only systemic therapy is tamoxifen for 5 years. Also predicts no benefit from chemotherapy.  (3) adjuvant radiation 09/05/2014 through 10/15/2014:  Right breast 4500 cGy in 25 sessions , right breast tumor bed boost 540 cGy in 3 sessions   (4) started tamoxifen 11/06/2014  (a)  Patient is status post remote TAH-BSO  PLAN: Marie Pierce is now 2-1/2  years out from definitive surgery for breast cancer with no evidence of disease recurrence.  This is very favorable.  She is tolerating tamoxifen well and the plan is to continue that for a total of 5 years.  I reassured her that the discomfort she is feeling on the underside of her right breast is related to her prior surgery and not to breast cancer recurrence.  We reviewed her mammography today in detail  She will see me again in 1 year.  She knows to call for any problems that may develop before the next visit  Magrinat, Virgie Dad, MD  03/05/17 11:39 AM Medical Oncology and Hematology [facility] [place], [place] [address] Tel. [phone]    Fax. [phone]  This document serves as a record of services personally performed by Lurline Del, MD. It was created on his behalf by Sheron Nightingale, a trained medical scribe. The creation of this record is based on the scribe's personal observations and the provider's statements to them.   I have reviewed the above documentation for accuracy and completeness, and I agree with the above.

## 2017-03-05 ENCOUNTER — Telehealth: Payer: Self-pay | Admitting: Oncology

## 2017-03-05 ENCOUNTER — Inpatient Hospital Stay: Payer: 59

## 2017-03-05 ENCOUNTER — Inpatient Hospital Stay: Payer: 59 | Attending: Oncology | Admitting: Oncology

## 2017-03-05 VITALS — BP 151/79 | HR 99 | Temp 98.7°F | Resp 18 | Ht 62.0 in | Wt 175.2 lb

## 2017-03-05 DIAGNOSIS — Z170 Estrogen receptor positive status [ER+]: Secondary | ICD-10-CM | POA: Diagnosis not present

## 2017-03-05 DIAGNOSIS — C50411 Malignant neoplasm of upper-outer quadrant of right female breast: Secondary | ICD-10-CM

## 2017-03-05 DIAGNOSIS — Z79899 Other long term (current) drug therapy: Secondary | ICD-10-CM | POA: Diagnosis not present

## 2017-03-05 DIAGNOSIS — Z90710 Acquired absence of both cervix and uterus: Secondary | ICD-10-CM

## 2017-03-05 DIAGNOSIS — Z923 Personal history of irradiation: Secondary | ICD-10-CM | POA: Diagnosis not present

## 2017-03-05 LAB — CBC WITH DIFFERENTIAL/PLATELET
BASOS ABS: 0 10*3/uL (ref 0.0–0.1)
Basophils Relative: 1 %
EOS ABS: 0.1 10*3/uL (ref 0.0–0.5)
EOS PCT: 2 %
HCT: 42.6 % (ref 34.8–46.6)
Hemoglobin: 14.2 g/dL (ref 11.6–15.9)
Lymphocytes Relative: 27 %
Lymphs Abs: 1.3 10*3/uL (ref 0.9–3.3)
MCH: 32.3 pg (ref 25.1–34.0)
MCHC: 33.4 g/dL (ref 31.5–36.0)
MCV: 96.7 fL (ref 79.5–101.0)
Monocytes Absolute: 0.4 10*3/uL (ref 0.1–0.9)
Monocytes Relative: 8 %
Neutro Abs: 2.9 10*3/uL (ref 1.5–6.5)
Neutrophils Relative %: 62 %
PLATELETS: 143 10*3/uL — AB (ref 145–400)
RBC: 4.41 MIL/uL (ref 3.70–5.45)
RDW: 12.9 % (ref 11.2–14.5)
WBC: 4.7 10*3/uL (ref 3.9–10.3)

## 2017-03-05 LAB — COMPREHENSIVE METABOLIC PANEL
ALT: 33 U/L (ref 0–55)
AST: 26 U/L (ref 5–34)
Albumin: 3.8 g/dL (ref 3.5–5.0)
Alkaline Phosphatase: 80 U/L (ref 40–150)
Anion gap: 7 (ref 3–11)
BUN: 13 mg/dL (ref 7–26)
CHLORIDE: 105 mmol/L (ref 98–109)
CO2: 27 mmol/L (ref 22–29)
CREATININE: 0.74 mg/dL (ref 0.60–1.10)
Calcium: 9 mg/dL (ref 8.4–10.4)
GFR calc non Af Amer: >60 mL/min (ref >60)
Glucose, Bld: 93 mg/dL (ref 70–140)
POTASSIUM: 4.2 mmol/L (ref 3.5–5.1)
SODIUM: 139 mmol/L (ref 136–145)
Total Bilirubin: 0.5 mg/dL (ref 0.2–1.2)
Total Protein: 6.3 g/dL — ABNORMAL LOW (ref 6.4–8.3)

## 2017-03-05 LAB — LIPID PANEL
Cholesterol: 166 mg/dL (ref 0–200)
HDL: 40 mg/dL — ABNORMAL LOW (ref >40)
LDL CALC: 101 mg/dL — AB (ref 0–99)
TRIGLYCERIDES: 125 mg/dL (ref <150)
Total CHOL/HDL Ratio: 4.2 RATIO
VLDL: 25 mg/dL (ref 0–40)

## 2017-03-05 LAB — TSH: TSH: 0.561 u[IU]/mL (ref 0.308–3.960)

## 2017-03-05 NOTE — Telephone Encounter (Signed)
Gave patient AVs and calendar of upcoming march 2020 appointments.  °

## 2017-03-30 ENCOUNTER — Encounter: Payer: Self-pay | Admitting: Internal Medicine

## 2017-03-30 ENCOUNTER — Ambulatory Visit (INDEPENDENT_AMBULATORY_CARE_PROVIDER_SITE_OTHER): Payer: 59 | Admitting: Internal Medicine

## 2017-03-30 VITALS — BP 130/78 | HR 76 | Ht 62.0 in | Wt 170.6 lb

## 2017-03-30 DIAGNOSIS — Z86010 Personal history of colonic polyps: Secondary | ICD-10-CM | POA: Diagnosis not present

## 2017-03-30 DIAGNOSIS — K219 Gastro-esophageal reflux disease without esophagitis: Secondary | ICD-10-CM | POA: Diagnosis not present

## 2017-03-30 DIAGNOSIS — Z800 Family history of malignant neoplasm of digestive organs: Secondary | ICD-10-CM | POA: Diagnosis not present

## 2017-03-30 DIAGNOSIS — K6289 Other specified diseases of anus and rectum: Secondary | ICD-10-CM

## 2017-03-30 MED ORDER — ESOMEPRAZOLE MAGNESIUM 40 MG PO CPDR: DELAYED_RELEASE_CAPSULE | ORAL | 3 refills | Status: DC

## 2017-03-30 NOTE — Patient Instructions (Signed)
We have sent the following medications to your pharmacy for you to pick up at your convenience:  Nexium  Use Metamucil daily  Use Desitin as discussed with Dr. Henrene Pastor  Please follow up in 2 years

## 2017-03-30 NOTE — Progress Notes (Signed)
HISTORY OF PRESENT ILLNESS:  Marie Pierce is a 62 y.o. female with past medical history as listed below who is followed in this office for chronic GERD, history of gastric ulcer, adenomatous colon polyps, history of constipation, and a family history of colon cancer. She was last evaluated in this office 11/01/2014. See that dictation. She subsequently underwent surveillance colonoscopy December 2016. Diminutive adenoma removed. Otherwise normal. Follow-up in 5 years recommended. For GERD she is maintained on Nexium 40 mg daily. No reflux symptoms. No dysphagia. No upper abdominal pain. Last upper endoscopy May 2013 was normal. Review of outside blood work from March 2019 shows unremarkable comprehensive metabolic panel. CBC shows hemoglobin 14.2. Platelets 143,000. I was started here that her father passed away in 2022/02/16. Today her GI review of systems is negative except for some intermittent rectal discomfort with defecation over the past few months. Describes it as burning and sensation of sandpaper.  REVIEW OF SYSTEMS:  All non-GI ROS negative unless otherwise stated in the history of present illness except for arthritis  Past Medical History:  Diagnosis Date  . Acute sinus infection 12/23/2015   finished antibiotic 12/31/2015  . Arthritis    multiple joints  . Breast asymmetry 12/2015  . Breast cancer ([facility])   . Cough 01/01/2016  . Dental crowns present   . GERD (gastroesophageal reflux disease)   . History of breast cancer 2016  . History of gastric ulcer   . History of PAT (paroxysmal atrial tachycardia)    no current med.  . Personal history of radiation therapy   . PONV (postoperative nausea and vomiting)   . S/P radiation therapy     Past Surgical History:  Procedure Laterality Date  . BREAST BIOPSY Left    x 2  . BREAST BIOPSY Right   . BREAST LUMPECTOMY     right 2016  . BREAST REDUCTION SURGERY Bilateral 01/03/2016   Procedure: MAMMARY REDUCTION  (BREAST);   Surgeon: Irene Limbo, MD;  Location: [facility];  Service: Plastics;  Laterality: Bilateral;  MAMMARY REDUCTION  (BREAST)  . CARDIAC CATHETERIZATION N/A 11/21/2015   Procedure: Left Heart Cath and Coronary Angiography;  Surgeon: Troy Sine, MD;  Location: [facility];  Service: Cardiovascular;  Laterality: N/A;  . CESAREAN SECTION     x 2  . COLONOSCOPY WITH PROPOFOL  01/02/2015  . ESOPHAGOGASTRODUODENOSCOPY (EGD) WITH PROPOFOL  05/20/2011  . HEMORRHOID SURGERY    . PLANTAR FASCIA SURGERY Left   . RADIOACTIVE SEED GUIDED PARTIAL MASTECTOMY WITH AXILLARY SENTINEL LYMPH NODE BIOPSY Right 07/17/2014   Procedure: RADIOACTIVE SEED GUIDED PARTIAL MASTECTOMY WITH AXILLARY SENTINEL LYMPH NODE BIOPSY;  Surgeon: [name], MD;  Location: [facility];  Service: General;  Laterality: Right;  . REDUCTION MAMMAPLASTY     2017  . TONSILLECTOMY    . TOTAL ABDOMINAL HYSTERECTOMY W/ BILATERAL SALPINGOOPHORECTOMY  09/05/2003    Social History Marie Pierce  reports that she has never smoked. She has never used smokeless tobacco. She reports that she drinks alcohol. She reports that she does not use drugs.  family history includes Brain cancer in her maternal uncle; Breast cancer in her cousin and paternal aunt; Breast cancer (age of onset: 69) in her maternal aunt; Breast cancer (age of onset: 43) in her maternal grandmother; Colon cancer (age of onset: 6) in her father; Colon polyps in her brother; Diabetes in her mother; Heart disease in her mother; Leukemia (age of onset: 66) in  her cousin; Lung cancer in her maternal uncle and maternal uncle; Skin cancer in her brother; Stomach cancer (age of onset: 84) in her maternal aunt.  Allergies  Allergen Reactions  . Morphine And Related Hives  . Penicillins Swelling    SWELLING OF THROAT  . Sulfa Antibiotics Hives  . Ultram [Tramadol Hcl] Nausea And Vomiting and Other (See Comments)    DIZZINESS        PHYSICAL EXAMINATION: Vital signs: BP 130/78   Pulse 76   Ht 5\' 2"  (1.575 m)   Wt 170 lb 9.6 oz (77.4 kg)   BMI 31.20 kg/m   Constitutional: generally well-appearing, no acute distress Psychiatric: alert and oriented x3, cooperative Eyes: extraocular movements intact, anicteric, conjunctiva pink Mouth: oral pharynx moist, no lesions Neck: supple no lymphadenopathy Cardiovascular: heart regular rate and rhythm, no murmur Lungs: clear to auscultation bilaterally Abdomen: soft, obese,nontender, nondistended, no obvious ascites, no peritoneal signs, normal bowel sounds, no organomegaly Rectal:no external abnormalities. Mild excoriation of the skin of the anus posteriorly. No tear. No mass. Extremities: no clubbing, cyanosis, or lower extremity edema bilaterally Skin: no lesions on visible extremities Neuro: No focal deficits. Cranial nerves intact  ASSESSMENT:  #1. Chronic GERD. Symptoms controlled with Nexium #2. History of gastric ulcer. Followed to healing on last endoscopy May 2013 #3. Personal history of adenomatous polyps and family history of colon cancer. Last colonoscopy December 2016. #4. Perirectal irritation from slight skin excoriation   PLAN:  #1. Reflux precautions #2. Continue Nexium 40 mg daily. Refilled #3. Metamucil, sitz baths, dry perirectal area carefully, and Desitin as needed #4. Routine office follow-up in 2 years. Sooner if needed. #5. Surveillance colonoscopy around December 2021.  25 minutes spent face-to-face with the patient. Greater than 50% a time use for counseling regarding her chronic GERD, and perirectal discomfort

## 2017-05-18 ENCOUNTER — Other Ambulatory Visit: Payer: Self-pay | Admitting: Oncology

## 2017-06-14 ENCOUNTER — Other Ambulatory Visit: Payer: Self-pay | Admitting: Oncology

## 2017-06-14 ENCOUNTER — Other Ambulatory Visit: Payer: Self-pay | Admitting: General Surgery

## 2017-06-14 DIAGNOSIS — Z853 Personal history of malignant neoplasm of breast: Secondary | ICD-10-CM

## 2017-07-14 ENCOUNTER — Ambulatory Visit: Payer: 59

## 2017-07-14 ENCOUNTER — Ambulatory Visit
Admission: RE | Admit: 2017-07-14 | Discharge: 2017-07-14 | Disposition: A | Discharge status: Home / Self Care | Payer: 59 | Source: Ambulatory Visit | Attending: Oncology | Admitting: Oncology

## 2017-07-14 DIAGNOSIS — Z853 Personal history of malignant neoplasm of breast: Secondary | ICD-10-CM

## 2017-08-13 ENCOUNTER — Other Ambulatory Visit: Payer: Self-pay | Admitting: Oncology

## 2017-08-21 ENCOUNTER — Emergency Department (HOSPITAL_BASED_OUTPATIENT_CLINIC_OR_DEPARTMENT_OTHER): Payer: 59

## 2017-08-21 ENCOUNTER — Other Ambulatory Visit: Payer: Self-pay

## 2017-08-21 ENCOUNTER — Encounter (HOSPITAL_BASED_OUTPATIENT_CLINIC_OR_DEPARTMENT_OTHER): Payer: Self-pay | Admitting: Emergency Medicine

## 2017-08-21 ENCOUNTER — Emergency Department (HOSPITAL_BASED_OUTPATIENT_CLINIC_OR_DEPARTMENT_OTHER)
Admission: EM | Admit: 2017-08-21 | Discharge: 2017-08-21 | Disposition: A | Discharge status: Home / Self Care | Payer: 59 | Attending: Emergency Medicine | Admitting: Emergency Medicine

## 2017-08-21 DIAGNOSIS — Y92814 Boat as the place of occurrence of the external cause: Secondary | ICD-10-CM | POA: Diagnosis not present

## 2017-08-21 DIAGNOSIS — Y998 Other external cause status: Secondary | ICD-10-CM | POA: Diagnosis not present

## 2017-08-21 DIAGNOSIS — Z853 Personal history of malignant neoplasm of breast: Secondary | ICD-10-CM | POA: Diagnosis not present

## 2017-08-21 DIAGNOSIS — Z79899 Other long term (current) drug therapy: Secondary | ICD-10-CM | POA: Insufficient documentation

## 2017-08-21 DIAGNOSIS — R2241 Localized swelling, mass and lump, right lower limb: Secondary | ICD-10-CM | POA: Diagnosis not present

## 2017-08-21 DIAGNOSIS — W19XXXA Unspecified fall, initial encounter: Secondary | ICD-10-CM | POA: Diagnosis not present

## 2017-08-21 DIAGNOSIS — Y9389 Activity, other specified: Secondary | ICD-10-CM | POA: Diagnosis not present

## 2017-08-21 DIAGNOSIS — Z923 Personal history of irradiation: Secondary | ICD-10-CM | POA: Insufficient documentation

## 2017-08-21 DIAGNOSIS — S93401A Sprain of unspecified ligament of right ankle, initial encounter: Secondary | ICD-10-CM | POA: Diagnosis not present

## 2017-08-21 DIAGNOSIS — S99911A Unspecified injury of right ankle, initial encounter: Secondary | ICD-10-CM | POA: Diagnosis present

## 2017-08-21 NOTE — Discharge Instructions (Signed)
Use ASO splint and crutches. Try and get off your right leg as much as possible   See ortho in a week if you have persistent pain and swelling.   Expect your foot to turn black and blue   Return to ER if you have worse swelling, severe pain, unable to walk

## 2017-08-21 NOTE — ED Provider Notes (Signed)
[facility] EMERGENCY DEPARTMENT Provider Note   CSN: [id number] Arrival date & time: 08/21/17  1836     History   Chief Complaint Chief Complaint  Patient presents with  . Ankle Pain    HPI Marie Pierce is a 62 y.o. female hx of GERD, here with fall. She states that she was on her boat and then rolled her ankle this afternoon. Denies any other injuries. Patient states that she has worsening swelling and pain of the ankle. No meds prior to arrival.   The history is provided by the patient.    Past Medical History:  Diagnosis Date  . Acute sinus infection 12/23/2015   finished antibiotic 12/31/2015  . Arthritis    multiple joints  . Breast asymmetry 12/2015  . Breast cancer ([place])   . Cough 01/01/2016  . Dental crowns present   . GERD (gastroesophageal reflux disease)   . History of breast cancer 2016  . History of gastric ulcer   . History of PAT (paroxysmal atrial tachycardia)    no current med.  . Personal history of radiation therapy   . PONV (postoperative nausea and vomiting)   . S/P radiation therapy     Patient Active Problem List   Diagnosis Date Noted  . SVT (supraventricular tachycardia) ([place])   . Elevated troponin 11/19/2015  . Genetic testing 08/08/2014  . Family history of breast cancer in female 08/08/2014  . Family history of colon cancer 08/08/2014  . Malignant neoplasm of upper-outer quadrant of right breast in female, estrogen receptor positive ([place]) 07/06/2014  . Abdominal pain, epigastric 05/18/2010  . INCREASED BLOOD PRESSURE 11/28/2008    Past Surgical History:  Procedure Laterality Date  . BREAST BIOPSY Left    x 2  . BREAST BIOPSY Right   . BREAST LUMPECTOMY     right 2016  . BREAST REDUCTION SURGERY Bilateral 01/03/2016   Procedure: MAMMARY REDUCTION  (BREAST);  Surgeon: Irene Limbo, MD;  Location: [facility];  Service: Plastics;  Laterality: Bilateral;  MAMMARY REDUCTION  (BREAST)  . CARDIAC  CATHETERIZATION N/A 11/21/2015   Procedure: Left Heart Cath and Coronary Angiography;  Surgeon: Troy Sine, MD;  Location: [facility];  Service: Cardiovascular;  Laterality: N/A;  . CESAREAN SECTION     x 2  . COLONOSCOPY WITH PROPOFOL  01/02/2015  . ESOPHAGOGASTRODUODENOSCOPY (EGD) WITH PROPOFOL  05/20/2011  . HEMORRHOID SURGERY    . PLANTAR FASCIA SURGERY Left   . RADIOACTIVE SEED GUIDED PARTIAL MASTECTOMY WITH AXILLARY SENTINEL LYMPH NODE BIOPSY Right 07/17/2014   Procedure: RADIOACTIVE SEED GUIDED PARTIAL MASTECTOMY WITH AXILLARY SENTINEL LYMPH NODE BIOPSY;  Surgeon: [name], MD;  Location: [facility];  Service: General;  Laterality: Right;  . REDUCTION MAMMAPLASTY     2017  . TONSILLECTOMY    . TOTAL ABDOMINAL HYSTERECTOMY W/ BILATERAL SALPINGOOPHORECTOMY  09/05/2003     OB History    Gravida  2   Para  2   Term      Preterm      AB      Living        SAB      TAB      Ectopic      Multiple      Live Births           Obstetric Comments  menarche age 25, first live birth at 5, which increases the risk of breast cancer developing. The patient is [place]  P2. She is postmenopausal and did not take hormone replacement. She took oral contraceptives for about 10 years remotely with no complication s             Home Medications    Prior to Admission medications   Medication Sig Start Date End Date Taking? Authorizing Provider  esomeprazole (NEXIUM) 40 MG capsule TAKE ONE CAPSULE BY MOUTH EVERY DAY BEFORE BREAKFAST 03/30/17   Irene Shipper, MD  tamoxifen (NOLVADEX) 20 MG tablet TAKE 1 TABLET BY MOUTH EVERY DAY 08/13/17   Magrinat, Virgie Dad, MD  venlafaxine XR (EFFEXOR-XR) 37.5 MG 24 hr capsule TAKE ONE CAPSULE BY MOUTH EVERY DAY WITH BREAKFAST 09/14/16   Magrinat, Virgie Dad, MD    Family History Family History  Problem Relation Age of Onset  . Colon cancer Father 81       and dx. again at 67  . Stomach cancer Maternal Aunt 60  .  Diabetes Mother   . Heart disease Mother   . Breast cancer Paternal Aunt        dx. 72s  . Breast cancer Maternal Grandmother 55  . Skin cancer Brother        not melanoma  . Colon polyps Brother        1 polyp on colonoscopy  . Breast cancer Maternal Aunt 52  . Lung cancer Maternal Uncle        smoker  . Lung cancer Maternal Uncle        smoker  . Brain cancer Maternal Uncle        tumor type unknown  . Breast cancer Cousin        underwent BL mastectomies  . Leukemia Cousin 6    Social History Social History   Tobacco Use  . Smoking status: Never Smoker  . Smokeless tobacco: Never Used  Substance Use Topics  . Alcohol use: Yes    Comment: occasionally  . Drug use: No     Allergies   Morphine and related; Penicillins; Sulfa antibiotics; and Ultram [tramadol hcl]   Review of Systems Review of Systems  Skin:       R ankle pain   All other systems reviewed and are negative.    Physical Exam Updated Vital Signs Ht 5\' 2"  (1.575 m)   Wt 77.1 kg   BMI 31.09 kg/m   Physical Exam  Constitutional: She appears well-developed.  HENT:  Head: Normocephalic.  Eyes: Pupils are equal, round, and reactive to light.  Neck: Normal range of motion.  Cardiovascular: Normal rate.  Pulmonary/Chest: Effort normal.  Abdominal: Soft.  Musculoskeletal:  R ankle swollen, tenderness over the lateral malleolus. Ecchymosis lateral aspect of the foot, no base of 5th tenderness, neurovascular intact   Neurological: She is alert.  Skin: Skin is warm.  Psychiatric: She has a normal mood and affect.  Nursing note and vitals reviewed.    ED Treatments / Results  Labs (all labs ordered are listed, but only abnormal results are displayed) Labs Reviewed - No data to display  EKG None  Radiology Dg Ankle Complete Right  Result Date: 08/21/2017 CLINICAL DATA:  Right ankle injury. EXAM: RIGHT ANKLE - COMPLETE 3+ VIEW COMPARISON:  None. FINDINGS: There is no evidence of  fracture, dislocation, or joint effusion. There is no evidence of arthropathy or other focal bone abnormality. Soft tissues are unremarkable. IMPRESSION: Negative. Electronically Signed   By: Fidela Salisbury M.D.   On: 08/21/2017 19:19    Procedures Procedures (including  critical care time)  Medications Ordered in ED Medications - No data to display   Initial Impression / Assessment and Plan / ED Course  I have reviewed the triage vital signs and the nursing notes.  Pertinent labs & imaging results that were available during my care of the patient were reviewed by me and considered in my medical decision making (see chart for details).     Marie Pierce is a 62 y.o. female here with R ankle pain. I think likely sprain vs fracture. Will get xrays.   9:15 PM Xray showed no fracture. Offered motrin but she has that at home. ASO brace, crutches placed. Will dc home. Ortho follow up in a week if pain and swelling persists.    Final Clinical Impressions(s) / ED Diagnoses   Final diagnoses:  None    ED Discharge Orders    None       Drenda Freeze, MD 08/21/17 2116

## 2017-08-21 NOTE — ED Triage Notes (Signed)
Patient states that she fell into her boat and hurt her right ankle - swelling noted

## 2017-09-13 ENCOUNTER — Other Ambulatory Visit: Payer: Self-pay | Admitting: Oncology

## 2017-09-21 ENCOUNTER — Other Ambulatory Visit: Payer: Self-pay | Admitting: General Surgery

## 2017-09-21 DIAGNOSIS — C50411 Malignant neoplasm of upper-outer quadrant of right female breast: Secondary | ICD-10-CM

## 2017-10-08 ENCOUNTER — Ambulatory Visit
Admission: RE | Admit: 2017-10-08 | Discharge: 2017-10-08 | Disposition: A | Discharge status: Home / Self Care | Payer: 59 | Source: Ambulatory Visit | Attending: General Surgery | Admitting: General Surgery

## 2017-10-08 DIAGNOSIS — C50411 Malignant neoplasm of upper-outer quadrant of right female breast: Secondary | ICD-10-CM

## 2017-10-08 MED ORDER — GADOBENATE DIMEGLUMINE 529 MG/ML IV SOLN
16.0000 mL | Freq: Once | INTRAVENOUS | Status: AC | PRN
  Administered 2017-10-08: 16 mL via INTRAVENOUS

## 2017-11-09 ENCOUNTER — Other Ambulatory Visit: Payer: Self-pay | Admitting: Oncology

## 2018-02-06 ENCOUNTER — Other Ambulatory Visit: Payer: Self-pay | Admitting: Oncology

## 2018-03-09 NOTE — Progress Notes (Signed)
No show

## 2018-03-10 ENCOUNTER — Inpatient Hospital Stay: Payer: 59 | Attending: Oncology

## 2018-03-10 ENCOUNTER — Inpatient Hospital Stay: Payer: 59 | Admitting: Oncology

## 2018-03-10 ENCOUNTER — Encounter: Payer: Self-pay | Admitting: Oncology

## 2018-03-28 ENCOUNTER — Telehealth: Payer: Self-pay | Admitting: Oncology

## 2018-03-28 NOTE — Telephone Encounter (Signed)
Called patient per 3/20 VM log.  Patient wanted to reschedule her appt.  Patient aware of appt date and time.

## 2018-04-11 ENCOUNTER — Telehealth: Payer: Self-pay | Admitting: Oncology

## 2018-04-11 MED ORDER — TAMOXIFEN CITRATE 20 MG PO TABS: 20.0000 mg | ORAL_TABLET | Freq: Every day | ORAL | 2 refills | Status: DC

## 2018-04-11 NOTE — Telephone Encounter (Signed)
Tried to reach regarding 11/4

## 2018-04-12 ENCOUNTER — Other Ambulatory Visit: Payer: Self-pay

## 2018-04-12 MED ORDER — ESOMEPRAZOLE MAGNESIUM 40 MG PO CPDR: DELAYED_RELEASE_CAPSULE | ORAL | 1 refills | Status: DC

## 2018-04-18 ENCOUNTER — Ambulatory Visit (HOSPITAL_BASED_OUTPATIENT_CLINIC_OR_DEPARTMENT_OTHER): Payer: 59 | Admitting: Oncology

## 2018-04-18 ENCOUNTER — Other Ambulatory Visit: Payer: 59

## 2018-04-18 DIAGNOSIS — C50411 Malignant neoplasm of upper-outer quadrant of right female breast: Secondary | ICD-10-CM

## 2018-04-18 DIAGNOSIS — Z170 Estrogen receptor positive status [ER+]: Secondary | ICD-10-CM

## 2018-05-14 ENCOUNTER — Other Ambulatory Visit: Payer: Self-pay | Admitting: Oncology

## 2018-05-15 ENCOUNTER — Other Ambulatory Visit: Payer: Self-pay | Admitting: Oncology

## 2018-05-15 MED ORDER — TAMOXIFEN CITRATE 20 MG PO TABS: 20.0000 mg | ORAL_TABLET | Freq: Every day | ORAL | 4 refills | Status: DC

## 2018-06-05 ENCOUNTER — Other Ambulatory Visit: Payer: Self-pay | Admitting: Oncology

## 2018-06-16 ENCOUNTER — Other Ambulatory Visit: Payer: Self-pay | Admitting: Oncology

## 2018-06-16 DIAGNOSIS — Z9889 Other specified postprocedural states: Secondary | ICD-10-CM

## 2018-07-21 ENCOUNTER — Ambulatory Visit
Admission: RE | Admit: 2018-07-21 | Discharge: 2018-07-21 | Disposition: A | Discharge status: Home / Self Care | Payer: 59 | Source: Ambulatory Visit | Attending: Oncology | Admitting: Oncology

## 2018-07-21 ENCOUNTER — Other Ambulatory Visit: Payer: Self-pay

## 2018-07-21 DIAGNOSIS — Z9889 Other specified postprocedural states: Secondary | ICD-10-CM

## 2018-10-12 ENCOUNTER — Other Ambulatory Visit: Payer: Self-pay | Admitting: Internal Medicine

## 2018-10-12 ENCOUNTER — Telehealth: Payer: Self-pay | Admitting: Internal Medicine

## 2018-10-13 MED ORDER — ESOMEPRAZOLE MAGNESIUM 40 MG PO CPDR: DELAYED_RELEASE_CAPSULE | ORAL | 1 refills | Status: DC

## 2018-10-13 NOTE — Telephone Encounter (Signed)
Sent rx to pharmacy to prompt a fax with correct prior authorization info

## 2018-10-17 ENCOUNTER — Telehealth: Payer: Self-pay | Admitting: Oncology

## 2018-10-17 NOTE — Telephone Encounter (Signed)
R/s appt per 10/8 sch message - pt aware of new appt

## 2018-11-09 ENCOUNTER — Ambulatory Visit: Payer: 59 | Admitting: Oncology

## 2018-11-09 ENCOUNTER — Other Ambulatory Visit: Payer: 59

## 2018-11-15 ENCOUNTER — Other Ambulatory Visit: Payer: Self-pay | Admitting: Adult Health

## 2018-11-15 ENCOUNTER — Inpatient Hospital Stay (HOSPITAL_BASED_OUTPATIENT_CLINIC_OR_DEPARTMENT_OTHER): Payer: 59 | Admitting: Adult Health

## 2018-11-15 ENCOUNTER — Inpatient Hospital Stay: Payer: 59 | Attending: Adult Health

## 2018-11-15 ENCOUNTER — Encounter: Payer: Self-pay | Admitting: Adult Health

## 2018-11-15 ENCOUNTER — Other Ambulatory Visit: Payer: Self-pay

## 2018-11-15 VITALS — BP 145/74 | HR 80 | Temp 98.4°F | Resp 18 | Ht 62.0 in | Wt 173.7 lb

## 2018-11-15 DIAGNOSIS — R7301 Impaired fasting glucose: Secondary | ICD-10-CM

## 2018-11-15 DIAGNOSIS — M1990 Unspecified osteoarthritis, unspecified site: Secondary | ICD-10-CM | POA: Insufficient documentation

## 2018-11-15 DIAGNOSIS — N951 Menopausal and female climacteric states: Secondary | ICD-10-CM | POA: Insufficient documentation

## 2018-11-15 DIAGNOSIS — C50411 Malignant neoplasm of upper-outer quadrant of right female breast: Secondary | ICD-10-CM

## 2018-11-15 DIAGNOSIS — Z79810 Long term (current) use of selective estrogen receptor modulators (SERMs): Secondary | ICD-10-CM | POA: Diagnosis not present

## 2018-11-15 DIAGNOSIS — Z170 Estrogen receptor positive status [ER+]: Secondary | ICD-10-CM

## 2018-11-15 LAB — CBC WITH DIFFERENTIAL/PLATELET
Abs Immature Granulocytes: 0.01 10*3/uL (ref 0.00–0.07)
Basophils Absolute: 0 10*3/uL (ref 0.0–0.1)
Basophils Relative: 1 %
Eosinophils Absolute: 0.1 10*3/uL (ref 0.0–0.5)
Eosinophils Relative: 3 %
HCT: 42.3 % (ref 36.0–46.0)
Hemoglobin: 14.2 g/dL (ref 12.0–15.0)
Immature Granulocytes: 0 %
Lymphocytes Relative: 32 %
Lymphs Abs: 1.4 10*3/uL (ref 0.7–4.0)
MCH: 32.5 pg (ref 26.0–34.0)
MCHC: 33.6 g/dL (ref 30.0–36.0)
MCV: 96.8 fL (ref 80.0–100.0)
Monocytes Absolute: 0.3 10*3/uL (ref 0.1–1.0)
Monocytes Relative: 8 %
Neutro Abs: 2.5 10*3/uL (ref 1.7–7.7)
Neutrophils Relative %: 56 %
Platelets: 153 10*3/uL (ref 150–400)
RBC: 4.37 MIL/uL (ref 3.87–5.11)
RDW: 12.5 % (ref 11.5–15.5)
WBC: 4.4 10*3/uL (ref 4.0–10.5)
nRBC: 0 % (ref 0.0–0.2)

## 2018-11-15 LAB — COMPREHENSIVE METABOLIC PANEL
ALT: 29 U/L (ref 0–44)
AST: 23 U/L (ref 15–41)
Albumin: 3.8 g/dL (ref 3.5–5.0)
Alkaline Phosphatase: 80 U/L (ref 38–126)
Anion gap: 7 (ref 5–15)
BUN: 13 mg/dL (ref 8–23)
CO2: 27 mmol/L (ref 22–32)
Calcium: 8.5 mg/dL — ABNORMAL LOW (ref 8.9–10.3)
Chloride: 106 mmol/L (ref 98–111)
Creatinine, Ser: 0.75 mg/dL (ref 0.44–1.00)
GFR calc Af Amer: >60 mL/min (ref >60)
GFR calc non Af Amer: >60 mL/min (ref >60)
Glucose, Bld: 120 mg/dL — ABNORMAL HIGH (ref 70–99)
Potassium: 4.3 mmol/L (ref 3.5–5.1)
Sodium: 140 mmol/L (ref 135–145)
Total Bilirubin: 0.3 mg/dL (ref 0.3–1.2)
Total Protein: 6.4 g/dL — ABNORMAL LOW (ref 6.5–8.1)

## 2018-11-15 LAB — LIPID PANEL
Cholesterol: 158 mg/dL (ref 0–200)
HDL: 37 mg/dL — ABNORMAL LOW (ref >40)
LDL Cholesterol: 104 mg/dL — ABNORMAL HIGH (ref 0–99)
Total CHOL/HDL Ratio: 4.3 RATIO
Triglycerides: 83 mg/dL (ref <150)
VLDL: 17 mg/dL (ref 0–40)

## 2018-11-15 LAB — HEMOGLOBIN A1C
Hgb A1c MFr Bld: 5.9 % — ABNORMAL HIGH (ref 4.8–5.6)
Mean Plasma Glucose: 122.63 mg/dL

## 2018-11-15 LAB — TSH: TSH: 1.164 u[IU]/mL (ref 0.308–3.960)

## 2018-11-15 NOTE — Progress Notes (Signed)
[facility]  [phone] [phone]     ID: Marie Pierce DOB: August 01, 1955  MR#: [id number]  [facility]#:[id number]  Patient Care Team: Jana Hakim Virgie Dad, MD as PCP - General (Oncology) Phineas Real, Belinda Block, MD as Consulting Physician (Gynecology) [name], MD as Consulting Physician (General Surgery) Magrinat, Virgie Dad, MD as Consulting Physician (Oncology) Arloa Koh, MD (Inactive) as Consulting Physician (Radiation Oncology) Mauro Kaufmann, RN as Registered Nurse Rockwell Germany, RN as Registered Nurse Holley Bouche, NP (Inactive) as Nurse Practitioner (Nurse Practitioner) Sylvan Cheese, NP as Nurse Practitioner (Hematology and Oncology) PCP: Chauncey Cruel, MD OTHER MD:  CHIEF COMPLAINT:  Estrogen receptor positive breast cancer  CURRENT TREATMENT:   tamoxifen   BREAST CANCER HISTORY:  from the original intake note:  "Marie Pierce" had bilateral screening mammography with tomography at the breast Center 06/18/2014. This showed a possible area of distortion in the right breast. On 06/25/2014 she was recalled for bilateral diagnostic mammography with tomography and right breast ultrasonography. The breast density was category C. In the right breast at 11:00 there was a persistent area of distortion which was not palpable by exam. Ultrasound confirmed a subtle hypoechoic mass in the area in question measuring 0.6 cm. Ultrasound of the right axilla was normal.  On 07/03/2014 the patient underwent right breast upper outer quadrant biopsy, with the pathology ([id number]) showing invasive ductal carcinoma, grade 1, estrogen receptor 100% positive, progesterone receptor 20% positive, both with strong staining intensity, with an MIB-1 of 10%, and HER-2 pending.  Her subsequent history is as detailed below.  INTERVAL HISTORY: Marie Pierce returns today for follow-up of her estrogen receptor positive breast cancer. She continues on tamoxifen,  with good tolerance.  She has some hot flashes.  She takes effexor for the hot flashes, and this helps.  She has not yet doubled up on her dose, and isn't sure she wants to start.    Since her last visit she underwent diagnostic mammogram on 07/21/2018 that showed no evidence of new or recurrent breast carcinoma.     REVIEW OF SYSTEMS: Marie Pierce has some arthralgias.  She has a longstanding h/o arthritis.  She has an increased level of stress and thinks this may be related.  She has increased pain in her breasts, along with focal areas of tenderness.  She normally has an MRI annually to evaluate her breasts due to her mammoplasty, breast density, and breast tenderness every other year.  Her last MRI was in 2019, so she would undergo her next one in 2021.    Marie Pierce denies any fever or chills, chest pain, palpitations, cough, nausea, vomiting, bowel/bladder changes, headaches, vision issues.  She notes that she does not have a PCP.  A detailed ROS was otherwise non contributory.    PAST MEDICAL HISTORY: Past Medical History:  Diagnosis Date  . Acute sinus infection 12/23/2015   finished antibiotic 12/31/2015  . Arthritis    multiple joints  . Breast asymmetry 12/2015  . Breast cancer ([place])   . Cough 01/01/2016  . Dental crowns present   . GERD (gastroesophageal reflux disease)   . History of breast cancer 2016  . History of gastric ulcer   . History of PAT (paroxysmal atrial tachycardia)    no current med.  . Personal history of radiation therapy   . PONV (postoperative nausea and vomiting)   . S/P radiation therapy     PAST SURGICAL HISTORY: Past Surgical History:  Procedure Laterality Date  .  BREAST BIOPSY Left    x 2  . BREAST BIOPSY Right   . BREAST LUMPECTOMY     right 2016  . BREAST REDUCTION SURGERY Bilateral 01/03/2016   Procedure: MAMMARY REDUCTION  (BREAST);  Surgeon: Irene Limbo, MD;  Location: [facility];  Service: Plastics;  Laterality: Bilateral;  MAMMARY  REDUCTION  (BREAST)  . CARDIAC CATHETERIZATION N/A 11/21/2015   Procedure: Left Heart Cath and Coronary Angiography;  Surgeon: Troy Sine, MD;  Location: [facility];  Service: Cardiovascular;  Laterality: N/A;  . CESAREAN SECTION     x 2  . COLONOSCOPY WITH PROPOFOL  01/02/2015  . ESOPHAGOGASTRODUODENOSCOPY (EGD) WITH PROPOFOL  05/20/2011  . HEMORRHOID SURGERY    . PLANTAR FASCIA SURGERY Left   . RADIOACTIVE SEED GUIDED PARTIAL MASTECTOMY WITH AXILLARY SENTINEL LYMPH NODE BIOPSY Right 07/17/2014   Procedure: RADIOACTIVE SEED GUIDED PARTIAL MASTECTOMY WITH AXILLARY SENTINEL LYMPH NODE BIOPSY;  Surgeon: [name], MD;  Location: [facility];  Service: General;  Laterality: Right;  . REDUCTION MAMMAPLASTY     2017  . TONSILLECTOMY    . TOTAL ABDOMINAL HYSTERECTOMY W/ BILATERAL SALPINGOOPHORECTOMY  09/05/2003    FAMILY HISTORY Family History  Problem Relation Age of Onset  . Colon cancer Father 70       and dx. again at 38  . Stomach cancer Maternal Aunt 60  . Diabetes Mother   . Heart disease Mother   . Breast cancer Paternal Aunt        dx. 86s  . Breast cancer Maternal Grandmother 57  . Skin cancer Brother        not melanoma  . Colon polyps Brother        1 polyp on colonoscopy  . Breast cancer Maternal Aunt 52  . Lung cancer Maternal Uncle        smoker  . Lung cancer Maternal Uncle        smoker  . Brain cancer Maternal Uncle        tumor type unknown  . Breast cancer Cousin        underwent BL mastectomies  . Leukemia Cousin 27   The patient's parents are still living, in their early 41s per the patient had 2 brothers, no sisters. The patient's father had colon cancer at age 47. One maternal aunt (out of 70) and the maternal grandmother were both diagnosed with breast cancer over the age of 82.  GYNECOLOGIC HISTORY:  No LMP recorded. Patient has had a hysterectomy.  menarche age 48, first live birth at 75, which increases the risk of breast  cancer developing. The patient is GX P2. She is  Status post TAH/BSO and did not take hormone replacement. 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 Her husband Marie Pierce  Is a Chief Strategy Officer. Daughter Marie Pierce is a Pharmacist, hospital in [place] son Marie Pierce is studying at [employer] (Event organiser). Marie Pierce is expecting her 1st grandchild in 2019.     ADVANCED DIRECTIVES:  Not in place   HEALTH MAINTENANCE: Social History   Tobacco Use  . Smoking status: Never Smoker  . Smokeless tobacco: Never Used  Substance Use Topics  . Alcohol use: Yes    Comment: occasionally  . Drug use: No     Colonoscopy: scheduled DEC 2016/ [facility]  PAP:  Bone density:  Lipid panel:  Allergies  Allergen Reactions  . Morphine And Related Hives  . Penicillins Swelling    SWELLING OF THROAT  . Sulfa Antibiotics Hives  . Ultram [Tramadol Hcl] Nausea And Vomiting and Other (See Comments)    DIZZINESS    Current Outpatient Medications  Medication Sig Dispense Refill  . esomeprazole (NEXIUM) 40 MG capsule TAKE ONE CAPSULE BY MOUTH EVERY DAY BEFORE BREAKFAST 90 capsule 1  . glucosamine-chondroitin 500-400 MG tablet Take by mouth.    . Multiple Vitamin (MULTI-VITAMIN DAILY PO) Multi Vitamin    . tamoxifen (NOLVADEX) 20 MG tablet Take 1 tablet (20 mg total) by mouth daily. 90 tablet 4  . venlafaxine XR (EFFEXOR-XR) 37.5 MG 24 hr capsule TAKE ONE CAPSULE BY MOUTH EVERY DAY WITH BREAKFAST 90 capsule 4  . Zinc 10 MG LOZG zinc     No current facility-administered medications for this visit.     OBJECTIVE:  Vitals:   11/15/18 0825  BP: (!) 145/74  Pulse: 80  Resp: 18  Temp: 98.4 F (36.9 C)  SpO2: 98%     Body mass index is 31.77 kg/m.    ECOG FS:0 - Asymptomatic GENERAL: Patient is a well appearing female in no acute distress HEENT:  Sclerae anicteric.  Oropharynx  clear and moist. No ulcerations or evidence of oropharyngeal candidiasis. Neck is supple.  NODES:  No cervical, supraclavicular, or axillary lymphadenopathy palpated.  BREAST EXAM:  Right breast s/p mammoplasty and radiation, tender throughout, no sign of local recurrence, left breast s/p mammoplasty, benign LUNGS:  Clear to auscultation bilaterally.  No wheezes or rhonchi. HEART:  Regular rate and rhythm. No murmur appreciated. ABDOMEN:  Soft, nontender.  Positive, normoactive bowel sounds. No organomegaly palpated. MSK:  No focal spinal tenderness to palpation. Full range of motion bilaterally in the upper extremities. EXTREMITIES:  No peripheral edema.   SKIN:  Clear with no obvious rashes or skin changes. No nail dyscrasia. NEURO:  Nonfocal. Well oriented.  Appropriate affect.   LAB RESULTS:  CMP     Component Value Date/Time   NA 139 03/05/2017 1058   NA 142 08/13/2015 1357   K 4.2 03/05/2017 1058   K 3.9 08/13/2015 1357   CL 105 03/05/2017 1058   CO2 27 03/05/2017 1058   CO2 27 08/13/2015 1357   GLUCOSE 93 03/05/2017 1058   GLUCOSE 128 08/13/2015 1357   BUN 13 03/05/2017 1058   BUN 8.3 08/13/2015 1357   CREATININE 0.74 03/05/2017 1058   CREATININE 0.8 08/13/2015 1357   CALCIUM 9.0 03/05/2017 1058   CALCIUM 8.9 08/13/2015 1357   PROT 6.3 (L) 03/05/2017 1058   PROT 6.3 (L) 08/13/2015 1357   ALBUMIN 3.8 03/05/2017 1058   ALBUMIN 3.7 08/13/2015 1357   AST 26 03/05/2017 1058   AST 34 08/13/2015 1357   ALT 33 03/05/2017 1058   ALT 60 (H) 08/13/2015 1357   ALKPHOS 80 03/05/2017 1058   ALKPHOS 87 08/13/2015 1357   BILITOT 0.5 03/05/2017 1058   BILITOT 0.33 08/13/2015 1357   GFRNONAA >60 03/05/2017 1058   GFRAA >60 03/05/2017 1058    INo results found for: SPEP, UPEP  Lab Results  Component Value Date   WBC 4.4 11/15/2018   NEUTROABS 2.5 11/15/2018   HGB 14.2 11/15/2018   HCT 42.3 11/15/2018   MCV 96.8 11/15/2018   PLT 153 11/15/2018      Chemistry       Component Value Date/Time   NA 139 03/05/2017 1058  NA 142 08/13/2015 1357   K 4.2 03/05/2017 1058   K 3.9 08/13/2015 1357   CL 105 03/05/2017 1058   CO2 27 03/05/2017 1058   CO2 27 08/13/2015 1357   BUN 13 03/05/2017 1058   BUN 8.3 08/13/2015 1357   CREATININE 0.74 03/05/2017 1058   CREATININE 0.8 08/13/2015 1357      Component Value Date/Time   CALCIUM 9.0 03/05/2017 1058   CALCIUM 8.9 08/13/2015 1357   ALKPHOS 80 03/05/2017 1058   ALKPHOS 87 08/13/2015 1357   AST 26 03/05/2017 1058   AST 34 08/13/2015 1357   ALT 33 03/05/2017 1058   ALT 60 (H) 08/13/2015 1357   BILITOT 0.5 03/05/2017 1058   BILITOT 0.33 08/13/2015 1357       No results found for: LABCA2  No components found for: LABCA125  No results for input(s): INR in the last 168 hours.  Urinalysis No results found for: COLORURINE, APPEARANCEUR, LABSPEC, [place], GLUCOSEU, HGBUR, BILIRUBINUR, [place], PROTEINUR, UROBILINOGEN, NITRITE, LEUKOCYTESUR  STUDIES: CLINICAL DATA:  Patient has a history of a right lumpectomy for breast carcinoma performed in 2016. She has had previous benign left breast biopsies and has had bilateral breast reduction surgery in 2017.Patient underwent a breast MRI on 10/08/2017, which showed no evidence of breast malignancy.  EXAM: DIGITAL DIAGNOSTIC BILATERAL MAMMOGRAM WITH CAD AND TOMO  COMPARISON:  Previous exam(s).  ACR Breast Density Category c: The breast tissue is heterogeneously dense, which may obscure small masses.  FINDINGS: Bilateral areas of postsurgical architectural distortion are noted, stable from the most recent prior studies.  There are no masses or areas of nonsurgical architectural distortion. There are no new or suspicious calcifications.  Mammographic images were processed with CAD.  IMPRESSION: 1. No evidence of new or recurrent breast carcinoma. 2. Benign postsurgical changes in each breast.  RECOMMENDATION: Diagnostic mammography in 1  year per standard post lumpectomy protocol.  I have discussed the findings and recommendations with the patient. Results were also provided in writing at the conclusion of the visit. If applicable, a reminder letter will be sent to the patient regarding the next appointment.  BI-RADS CATEGORY  2: Benign.   Electronically Signed   By: Lajean Manes M.D.   On: 07/21/2018 11:55  ASSESSMENT: 63 y.o. [place] woman status post right breast upper outer quadrant biopsy 07/03/2014 for a clinical T1b N0, stage IA invasive ductal carcinoma, grade 1, estrogen and progesterone receptor positive, with an MIB-1 of 10% and HER-2 pending.    (1) breast conserving surgery with sentinel lymph node sampling  07/17/2014 showed a pT1c pN0, stage IA  Invasive ductal carcinoma, grade 1, with ample margins. Repeat HER-2 was again negative  (2) Oncotype sent from the definitive surgical sample showed a recurrence score of 17, predicting a risk of recurrence outside the breast within 10 years of 11% if the patient's only systemic therapy is tamoxifen for 5 years. Also predicts no benefit from chemotherapy.  (3) adjuvant radiation 09/05/2014 through 10/15/2014:  Right breast 4500 cGy in 25 sessions , right breast tumor bed boost 540 cGy in 3 sessions   (4) started tamoxifen 11/06/2014  (a)  Patient is status post remote TAH-BSO  PLAN: Marie Pierce is doing well today.  She continues on Tamoxifen for her h/o right sided breast cancer without any major issues.  She has no sign of bresat cancer recurrence today.  I reviewed with her our recommendation to continue with annual mamograms.  Her next mammogram is due in 07/2019.  She also plans on getting an MRI every other year of her breasts, due to her mammoplasty and how it obscures some of her breast tissue couple with her breast density.  This would be due in 2021.  Marie Pierce has some hot flashes for which she  takes Effexor. Her hot flashes are intermittent and she is tolerating this moderately well.  She does not yet want to increase her Effexor.    Marie Pierce and I reviewed her CMP which notes a fasting blood sugar of 120.  This can be indicative of prediabetes.  I reviewed with her a healthy eating plan, and added on a hemoglobin a1c to her lab panel.  She was recommended to get a PCP who can follow this.  She also has a slightly low calcium level.  I gave her information on bone health which includes calcium and vitamin d recommendations.  She understands this.    We discussed healthy diet and exercise.  She will return in 1 year for labs and f/u with Dr. Jana Hakim.  She was recommended to continue with the appropriate pandemic precautions. She knows to call for any questions that may arise between now and her next appointment.  We are happy to see her sooner if needed.  A total of (30) minutes of face-to-face time was spent with this patient with greater than 50% of that time in counseling and care-coordination.   Wilber Bihari, NP 11/15/18 8:55 AM Medical Oncology and Hematology [facility] [address] [place], [place] [address] Tel. [phone]    Fax. [phone]

## 2018-11-15 NOTE — Patient Instructions (Signed)
Prediabetes Eating Plan Prediabetes is a condition that causes blood sugar (glucose) levels to be higher than normal. This increases the risk for developing diabetes. In order to prevent diabetes from developing, your health care provider may recommend a diet and other lifestyle changes to help you:  Control your blood glucose levels.  Improve your cholesterol levels.  Manage your blood pressure. Your health care provider may recommend working with a diet and nutrition specialist (dietitian) to make a meal plan that is best for you. What are tips for following this plan? Lifestyle  Set weight loss goals with the help of your health care team. It is recommended that most people with prediabetes lose 7% of their current body weight.  Exercise for at least 30 minutes at least 5 days a week.  Attend a support group or seek ongoing support from a mental health counselor.  Take over-the-counter and prescription medicines only as told by your health care provider. Reading food labels  Read food labels to check the amount of fat, salt (sodium), and sugar in prepackaged foods. Avoid foods that have: ? Saturated fats. ? Trans fats. ? Added sugars.  Avoid foods that have more than 300 milligrams (mg) of sodium per serving. Limit your daily sodium intake to less than 2,300 mg each day. Shopping  Avoid buying pre-made and processed foods. Cooking  Cook with olive oil. Do not use butter, lard, or ghee.  Bake, broil, grill, or boil foods. Avoid frying. Meal planning   Work with your dietitian to develop an eating plan that is right for you. This may include: ? Tracking how many calories you take in. Use a food diary, notebook, or mobile application to track what you eat at each meal. ? Using the glycemic index (GI) to plan your meals. The index tells you how quickly a food will raise your blood glucose. Choose low-GI foods. These foods take a longer time to raise blood glucose.  Consider  following a Mediterranean diet. This diet includes: ? Several servings each day of fresh fruits and vegetables. ? Eating fish at least twice a week. ? Several servings each day of whole grains, beans, nuts, and seeds. ? Using olive oil instead of other fats. ? Moderate alcohol consumption. ? Eating small amounts of red meat and whole-fat dairy.  If you have high blood pressure, you may need to limit your sodium intake or follow a diet such as the DASH eating plan. DASH is an eating plan that aims to lower high blood pressure. What foods are recommended? The items listed below may not be a complete list. Talk with your dietitian about what dietary choices are best for you. Grains Whole grains, such as whole-wheat or whole-grain breads, crackers, cereals, and pasta. Unsweetened oatmeal. Bulgur. Barley. Quinoa. Brown rice. Corn or whole-wheat flour tortillas or taco shells. Vegetables Lettuce. Spinach. Peas. Beets. Cauliflower. Cabbage. Broccoli. Carrots. Tomatoes. Squash. Eggplant. Herbs. Peppers. Onions. Cucumbers. Brussels sprouts. Fruits Berries. Bananas. Apples. Oranges. Grapes. Papaya. Mango. Pomegranate. Kiwi. Grapefruit. Cherries. Meats and other protein foods Seafood. Poultry without skin. Lean cuts of pork and beef. Tofu. Eggs. Nuts. Beans. Dairy Low-fat or fat-free dairy products, such as yogurt, cottage cheese, and cheese. Beverages Water. Tea. Coffee. Sugar-free or diet soda. Seltzer water. Lowfat or no-fat milk. Milk alternatives, such as soy or almond milk. Fats and oils Olive oil. Canola oil. Sunflower oil. Grapeseed oil. Avocado. Walnuts. Sweets and desserts Sugar-free or low-fat pudding. Sugar-free or low-fat ice cream and other frozen treats.   Seasoning and other foods Herbs. Sodium-free spices. Mustard. Relish. Low-fat, low-sugar ketchup. Low-fat, low-sugar barbecue sauce. Low-fat or fat-free mayonnaise. What foods are not recommended? The items listed below may not be a  complete list. Talk with your dietitian about what dietary choices are best for you. Grains Refined white flour and flour products, such as bread, pasta, snack foods, and cereals. Vegetables Canned vegetables. Frozen vegetables with butter or cream sauce. Fruits Fruits canned with syrup. Meats and other protein foods Fatty cuts of meat. Poultry with skin. Breaded or fried meat. Processed meats. Dairy Full-fat yogurt, cheese, or milk. Beverages Sweetened drinks, such as sweet iced tea and soda. Fats and oils Butter. Lard. Ghee. Sweets and desserts Baked goods, such as cake, cupcakes, pastries, cookies, and cheesecake. Seasoning and other foods Spice mixes with added salt. Ketchup. Barbecue sauce. Mayonnaise. Summary  To prevent diabetes from developing, you may need to make diet and other lifestyle changes to help control blood sugar, improve cholesterol levels, and manage your blood pressure.  Set weight loss goals with the help of your health care team. It is recommended that most people with prediabetes lose 7 percent of their current body weight.  Consider following a Mediterranean diet that includes plenty of fresh fruits and vegetables, whole grains, beans, nuts, seeds, fish, lean meat, low-fat dairy, and healthy oils. This information is not intended to replace advice given to you by your health care provider. Make sure you discuss any questions you have with your health care provider. Document Released: 05/08/2014 Document Revised: 04/15/2018 Document Reviewed: 02/26/2016 Elsevier Patient Education  2020 [place] protect organs, store calcium, anchor muscles, and support the whole body. Keeping your bones strong is important, especially as you get older. You can take actions to help keep your bones strong and healthy. Why is keeping my bones healthy important?  Keeping your bones healthy is important because your body constantly replaces bone cells.  Cells get old, and new cells take their place. As we age, we lose bone cells because the body may not be able to make enough new cells to replace the old cells. The amount of bone cells and bone tissue you have is referred to as bone mass. The higher your bone mass, the stronger your bones. The aging process leads to an overall loss of bone mass in the body, which can increase the likelihood of:  Joint pain and stiffness.  Broken bones.  A condition in which the bones become weak and brittle (osteoporosis). A large decline in bone mass occurs in older adults. In women, it occurs about the time of menopause. What actions can I take to keep my bones healthy? Good health habits are important for maintaining healthy bones. This includes eating nutritious foods and exercising regularly. To have healthy bones, you need to get enough of the right minerals and vitamins. Most nutrition experts recommend getting these nutrients from the foods that you eat. In some cases, taking supplements may also be recommended. Doing certain types of exercise is also important for bone health. What are the nutritional recommendations for healthy bones?  Eating a well-balanced diet with plenty of calcium and vitamin D will help to protect your bones. Nutritional recommendations vary from person to person. Ask your health care provider what is healthy for you. Here are some general guidelines. Get enough calcium Calcium is the most important (essential) mineral for bone health. Most people can get enough calcium from their diet, but supplements may be  recommended for people who are at risk for osteoporosis. Good sources of calcium include:  Dairy products, such as low-fat or nonfat milk, cheese, and yogurt.  Dark green leafy vegetables, such as bok choy and broccoli.  Calcium-fortified foods, such as orange juice, cereal, bread, soy beverages, and tofu products.  Nuts, such as almonds. Follow these recommended amounts  for daily calcium intake:  Children, age 3-3: 700 mg.  Children, age 21-8: 1,000 mg.  Children, age 56-13: 1,300 mg.  Teens, age 24-18: 1,300 mg.  Adults, age 51-50: 1,000 mg.  Adults, age 33-70: ? Men: 1,000 mg. ? Women: 1,200 mg.  Adults, age 81 or older: 1,200 mg.  Pregnant and breastfeeding females: ? Teens: 1,300 mg. ? Adults: 1,000 mg. Get enough vitamin D Vitamin D is the most essential vitamin for bone health. It helps the body absorb calcium. Sunlight stimulates the skin to make vitamin D, so be sure to get enough sunlight. If you live in a cold climate or you do not get outside often, your health care provider may recommend that you take vitamin D supplements. Good sources of vitamin D in your diet include:  Egg yolks.  Saltwater fish.  Milk and cereal fortified with vitamin D. Follow these recommended amounts for daily vitamin D intake:  Children and teens, age 3-18: 600 international units.  Adults, age 54 or younger: 400-800 international units.  Adults, age 3 or older: 800-1,000 international units. Get other important nutrients Other nutrients that are important for bone health include:  Phosphorus. This mineral is found in meat, poultry, dairy foods, nuts, and legumes. The recommended daily intake for adult men and adult women is 700 mg.  Magnesium. This mineral is found in seeds, nuts, dark green vegetables, and legumes. The recommended daily intake for adult men is 400-420 mg. For adult women, it is 310-320 mg.  Vitamin K. This vitamin is found in green leafy vegetables. The recommended daily intake is 120 mg for adult men and 90 mg for adult women. What type of physical activity is best for building and maintaining healthy bones? Weight-bearing and strength-building activities are important for building and maintaining healthy bones. Weight-bearing activities cause muscles and bones to work against gravity. Strength-building activities increase the  strength of the muscles that support bones. Weight-bearing and muscle-building activities include:  Walking and hiking.  Jogging and running.  Dancing.  Gym exercises.  Lifting weights.  Tennis and racquetball.  Climbing stairs.  Aerobics. Adults should get at least 30 minutes of moderate physical activity on most days. Children should get at least 60 minutes of moderate physical activity on most days. Ask your health care provider what type of exercise is best for you. How can I find out if my bone mass is low? Bone mass can be measured with an X-ray test called a bone mineral density (BMD) test. This test is recommended for all women who are age 83 or older. It may also be recommended for:  Men who are age 28 or older.  People who are at risk for osteoporosis because of: ? Having bones that break easily. ? Having a long-term disease that weakens bones, such as kidney disease or rheumatoid arthritis. ? Having menopause earlier than normal. ? Taking medicine that weakens bones, such as steroids, thyroid hormones, or hormone treatment for breast cancer or prostate cancer. ? Smoking. ? Drinking three or more alcoholic drinks a day. If you find that you have a low bone mass, you may be able  to prevent osteoporosis or further bone loss by changing your diet and lifestyle. Where can I find more information? For more information, check out the following websites:  [place]: AviationTales.fr  [employer] of Health: www.bones.SouthExposed.es  International Osteoporosis Foundation: Administrator.iofbonehealth.org Summary  The aging process leads to an overall loss of bone mass in the body, which can increase the likelihood of broken bones and osteoporosis.  Eating a well-balanced diet with plenty of calcium and vitamin D will help to protect your bones.  Weight-bearing and strength-building activities are also important for building and maintaining strong bones.   Bone mass can be measured with an X-ray test called a bone mineral density (BMD) test. This information is not intended to replace advice given to you by your health care provider. Make sure you discuss any questions you have with your health care provider. Document Released: 03/14/2003 Document Revised: 01/18/2017 Document Reviewed: 01/18/2017 Elsevier Patient Education  2020 [employer].

## 2018-11-16 ENCOUNTER — Telehealth: Payer: Self-pay | Admitting: Adult Health

## 2018-11-16 NOTE — Telephone Encounter (Signed)
I talk with patient regarding schedule  

## 2018-11-25 ENCOUNTER — Telehealth: Payer: Self-pay

## 2018-11-25 NOTE — Telephone Encounter (Signed)
Recent labs discussed with patient.  Copy mailed to patient per her request.

## 2019-03-06 ENCOUNTER — Ambulatory Visit: Payer: 59 | Admitting: Family

## 2019-03-14 ENCOUNTER — Telehealth: Payer: Self-pay

## 2019-03-14 NOTE — Telephone Encounter (Signed)
LVM for patient to return call to schedule New Patient appointment with Dr Alain Marion. He has agreed to take her on.

## 2019-03-30 ENCOUNTER — Other Ambulatory Visit: Payer: Self-pay

## 2019-03-30 ENCOUNTER — Encounter: Payer: Self-pay | Admitting: Internal Medicine

## 2019-03-30 ENCOUNTER — Ambulatory Visit: Payer: 59 | Admitting: Internal Medicine

## 2019-03-30 VITALS — BP 138/76 | HR 81 | Temp 99.3°F | Ht 62.0 in | Wt 170.0 lb

## 2019-03-30 DIAGNOSIS — I471 Supraventricular tachycardia: Secondary | ICD-10-CM

## 2019-03-30 DIAGNOSIS — R739 Hyperglycemia, unspecified: Secondary | ICD-10-CM | POA: Diagnosis not present

## 2019-03-30 DIAGNOSIS — R232 Flushing: Secondary | ICD-10-CM

## 2019-03-30 DIAGNOSIS — E559 Vitamin D deficiency, unspecified: Secondary | ICD-10-CM

## 2019-03-30 DIAGNOSIS — F5101 Primary insomnia: Secondary | ICD-10-CM

## 2019-03-30 DIAGNOSIS — Z789 Other specified health status: Secondary | ICD-10-CM

## 2019-03-30 DIAGNOSIS — Z170 Estrogen receptor positive status [ER+]: Secondary | ICD-10-CM

## 2019-03-30 DIAGNOSIS — C50411 Malignant neoplasm of upper-outer quadrant of right female breast: Secondary | ICD-10-CM

## 2019-03-30 LAB — BASIC METABOLIC PANEL
BUN: 19 mg/dL (ref 6–23)
CO2: 28 mEq/L (ref 19–32)
Calcium: 9.1 mg/dL (ref 8.4–10.5)
Chloride: 106 mEq/L (ref 96–112)
Creatinine, Ser: 0.74 mg/dL (ref 0.40–1.20)
GFR: 79.19 mL/min (ref >60.00)
Glucose, Bld: 97 mg/dL (ref 70–99)
Potassium: 3.9 mEq/L (ref 3.5–5.1)
Sodium: 141 mEq/L (ref 135–145)

## 2019-03-30 LAB — HEMOGLOBIN A1C: Hgb A1c MFr Bld: 5.9 % (ref 4.6–6.5)

## 2019-03-30 LAB — VITAMIN D 25 HYDROXY (VIT D DEFICIENCY, FRACTURES): VITD: 52.38 ng/mL (ref 30.00–100.00)

## 2019-03-30 MED ORDER — TEMAZEPAM 7.5 MG PO CAPS: 7.5000 mg | ORAL_CAPSULE | Freq: Every evening | ORAL | 2 refills | Status: DC | PRN

## 2019-03-30 NOTE — Assessment & Plan Note (Signed)
No relapse 

## 2019-03-30 NOTE — Progress Notes (Signed)
Subjective:  Patient ID: Marie Pierce, female    DOB: Nov 22, 1955  Age: 64 y.o. MRN: [id number]  CC: No chief complaint on file.   HPI Marie Pierce presents for a new pt visit C/o L flank pain - seeing a chiropractor ?COVID in [name] 2021  C/o GERD - on Nexium H/o breast ca 2016 - Dr Jana Hakim - lumpectomy and XRT  C/o hot flashes  C/o insomnia  Outpatient Medications Prior to Visit  Medication Sig Dispense Refill  . aspirin EC 81 MG tablet Take 81 mg by mouth daily.    [name] CALCIUM PO Take by mouth.    . esomeprazole (NEXIUM) 40 MG capsule TAKE ONE CAPSULE BY MOUTH EVERY DAY BEFORE BREAKFAST 90 capsule 1  . glucosamine-chondroitin 500-400 MG tablet Take by mouth.    . IBUPROFEN PO Take by mouth as needed.    . Multiple Vitamin (MULTI-VITAMIN DAILY PO) Multi Vitamin    . tamoxifen (NOLVADEX) 20 MG tablet Take 1 tablet (20 mg total) by mouth daily. 90 tablet 4  . venlafaxine XR (EFFEXOR-XR) 37.5 MG 24 hr capsule TAKE ONE CAPSULE BY MOUTH EVERY DAY WITH BREAKFAST 90 capsule 4  . VITAMIN D PO Take by mouth.    . Zinc 10 MG LOZG zinc     No facility-administered medications prior to visit.    ROS: Review of Systems  Constitutional: Negative for activity change, appetite change, chills, fatigue and unexpected weight change.  HENT: Negative for congestion, mouth sores and sinus pressure.   Eyes: Negative for visual disturbance.  Respiratory: Negative for cough and chest tightness.   Gastrointestinal: Negative for abdominal pain and nausea.  Genitourinary: Negative for difficulty urinating, frequency and vaginal pain.  Musculoskeletal: Positive for back pain. Negative for gait problem.  Skin: Negative for pallor and rash.  Neurological: Negative for dizziness, tremors, weakness, numbness and headaches.  Psychiatric/Behavioral: Negative for confusion, sleep disturbance and suicidal ideas.    Objective:  BP 138/76 (BP Location: Left Arm, Patient Position: Sitting, Cuff Size:  Normal)   Pulse 81   Temp 99.3 F (37.4 C) (Oral)   Ht 5\' 2"  (1.575 m)   Wt 170 lb (77.1 kg)   SpO2 98%   BMI 31.09 kg/m   BP Readings from Last 3 Encounters:  03/30/19 138/76  11/15/18 (!) 145/74  08/21/17 135/85    Wt Readings from Last 3 Encounters:  03/30/19 170 lb (77.1 kg)  11/15/18 173 lb 11.2 oz (78.8 kg)  08/21/17 170 lb (77.1 kg)    Physical Exam Constitutional:      General: She is not in acute distress.    Appearance: She is well-developed.  HENT:     Head: Normocephalic.     Right Ear: External ear normal.     Left Ear: External ear normal.     Nose: Nose normal.  Eyes:     General:        Right eye: No discharge.        Left eye: No discharge.     Conjunctiva/sclera: Conjunctivae normal.     Pupils: Pupils are equal, round, and reactive to light.  Neck:     Thyroid: No thyromegaly.     Vascular: No JVD.     Trachea: No tracheal deviation.  Cardiovascular:     Rate and Rhythm: Normal rate and regular rhythm.     Heart sounds: Normal heart sounds.  Pulmonary:     Effort: No respiratory distress.  Breath sounds: No stridor. No wheezing.  Abdominal:     General: Bowel sounds are normal. There is no distension.     Palpations: Abdomen is soft. There is no mass.     Tenderness: There is no abdominal tenderness. There is no guarding or rebound.  Musculoskeletal:        General: No tenderness.     Cervical back: Normal range of motion and neck supple.  Lymphadenopathy:     Cervical: No cervical adenopathy.  Skin:    Findings: No erythema or rash.  Neurological:     Cranial Nerves: No cranial nerve deficit.     Motor: No abnormal muscle tone.     Coordination: Coordination normal.     Deep Tendon Reflexes: Reflexes normal.  Psychiatric:        Behavior: Behavior normal.        Thought Content: Thought content normal.        Judgment: Judgment normal.   Chart/records reviewed  Lab Results  Component Value Date   WBC 4.4 11/15/2018   HGB  14.2 11/15/2018   HCT 42.3 11/15/2018   PLT 153 11/15/2018   GLUCOSE 120 (H) 11/15/2018   CHOL 158 11/15/2018   TRIG 83 11/15/2018   HDL 37 (L) 11/15/2018   LDLCALC 104 (H) 11/15/2018   ALT 29 11/15/2018   AST 23 11/15/2018   NA 140 11/15/2018   K 4.3 11/15/2018   CL 106 11/15/2018   CREATININE 0.75 11/15/2018   BUN 13 11/15/2018   CO2 27 11/15/2018   TSH 1.164 11/15/2018   INR 0.96 11/21/2015   HGBA1C 5.9 (H) 11/15/2018    MM DIAG BREAST TOMO BILATERAL  Result Date: 07/21/2018 CLINICAL DATA:  Patient has a history of a right lumpectomy for breast carcinoma performed in 2016. She has had previous benign left breast biopsies and has had bilateral breast reduction surgery in 2017.Patient underwent a breast MRI on 10/08/2017, which showed no evidence of breast malignancy. EXAM: DIGITAL DIAGNOSTIC BILATERAL MAMMOGRAM WITH CAD AND TOMO COMPARISON:  Previous exam(s). ACR Breast Density Category c: The breast tissue is heterogeneously dense, which may obscure small masses. FINDINGS: Bilateral areas of postsurgical architectural distortion are noted, stable from the most recent prior studies. There are no masses or areas of nonsurgical architectural distortion. There are no new or suspicious calcifications. Mammographic images were processed with CAD. IMPRESSION: 1. No evidence of new or recurrent breast carcinoma. 2. Benign postsurgical changes in each breast. RECOMMENDATION: Diagnostic mammography in 1 year per standard post lumpectomy protocol. I have discussed the findings and recommendations with the patient. Results were also provided in writing at the conclusion of the visit. If applicable, a reminder letter will be sent to the patient regarding the next appointment. BI-RADS CATEGORY  2: Benign. Electronically Signed   By: Lajean Manes M.D.   On: 07/21/2018 11:55    Assessment & Plan:    Walker Kehr, MD

## 2019-03-30 NOTE — Patient Instructions (Addendum)
Valerian root    These suggestions will probably help you to improve your metabolism if you are not overweight and to lose weight if you are overweight: 1.  Reduce your consumption of sugars and starches.  Eliminate high fructose corn syrup from your diet.  Reduce your consumption of processed foods.  For desserts try to have seasonal fruits, berries, nuts, cheeses or dark chocolate with more than 70% cacao. 2.  Do not snack 3.  You do not have to eat breakfast.  If you choose to have breakfast-eat plain greek yogurt, eggs, oatmeal (without sugar) 4.  Drink water, freshly brewed unsweetened tea (green, black or herbal) or coffee.  Do not drink sodas including diet sodas , juices, beverages sweetened with artificial sweeteners. 5.  Reduce your consumption of refined grains. 6.  Avoid protein drinks such as Optifast, Slim fast etc. Eat chicken, fish, meat, dairy and beans for your sources of protein 7.  Natural unprocessed fats like cold pressed virgin olive oil, butter, coconut oil are good for you.  Eat avocados 8.  Increase your consumption of fiber.  Fruits, berries, vegetables, whole grains, flaxseeds, Chia seeds, beans, popcorn, nuts, oatmeal are good sources of fiber 9.  Use vinegar in your diet, i.e. apple cider vinegar, red wine or balsamic vinegar 10.  You can try fasting.  For example you can skip breakfast and lunch every other day (24-hour fast) 11.  Stress reduction, good night sleep, relaxation, meditation, yoga and other physical activity is likely to help you to maintain low weight too. 12.  If you drink alcohol, limit your alcohol intake to no more than 2 drinks a day.

## 2019-03-31 LAB — SAR COV2 SEROLOGY (COVID19)AB(IGG),IA: SARS CoV2 AB IGG: NEGATIVE

## 2019-04-02 ENCOUNTER — Encounter: Payer: Self-pay | Admitting: Internal Medicine

## 2019-04-02 DIAGNOSIS — R232 Flushing: Secondary | ICD-10-CM | POA: Insufficient documentation

## 2019-04-02 DIAGNOSIS — G4700 Insomnia, unspecified: Secondary | ICD-10-CM | POA: Insufficient documentation

## 2019-04-02 NOTE — Assessment & Plan Note (Signed)
Follow-up with Dr Jana Hakim -she is status post lumpectomy and XRT On tamoxifen

## 2019-04-02 NOTE — Assessment & Plan Note (Signed)
Multifactorial We will try a low-dose temazepam as needed  Potential benefits of a long term benzodiazepines  use as well as potential risks  and complications were explained to the patient and were aknowledged.

## 2019-04-02 NOTE — Assessment & Plan Note (Signed)
On Effexor XR 

## 2019-04-12 ENCOUNTER — Other Ambulatory Visit: Payer: Self-pay | Admitting: Internal Medicine

## 2019-05-21 ENCOUNTER — Other Ambulatory Visit: Payer: Self-pay | Admitting: Oncology

## 2019-06-11 ENCOUNTER — Other Ambulatory Visit: Payer: Self-pay | Admitting: Oncology

## 2019-06-12 ENCOUNTER — Other Ambulatory Visit: Payer: Self-pay | Admitting: Oncology

## 2019-06-12 DIAGNOSIS — Z853 Personal history of malignant neoplasm of breast: Secondary | ICD-10-CM

## 2019-06-12 DIAGNOSIS — Z9889 Other specified postprocedural states: Secondary | ICD-10-CM

## 2019-07-12 ENCOUNTER — Other Ambulatory Visit: Payer: Self-pay | Admitting: *Deleted

## 2019-07-12 DIAGNOSIS — C50411 Malignant neoplasm of upper-outer quadrant of right female breast: Secondary | ICD-10-CM

## 2019-07-24 ENCOUNTER — Other Ambulatory Visit: Payer: Self-pay

## 2019-07-24 ENCOUNTER — Ambulatory Visit
Admission: RE | Admit: 2019-07-24 | Discharge: 2019-07-24 | Disposition: A | Discharge status: Home / Self Care | Payer: 59 | Source: Ambulatory Visit | Attending: Oncology | Admitting: Oncology

## 2019-07-24 DIAGNOSIS — Z853 Personal history of malignant neoplasm of breast: Secondary | ICD-10-CM

## 2019-07-24 DIAGNOSIS — Z9889 Other specified postprocedural states: Secondary | ICD-10-CM

## 2019-08-02 ENCOUNTER — Other Ambulatory Visit: Payer: Self-pay | Admitting: Oncology

## 2019-08-02 ENCOUNTER — Other Ambulatory Visit: Payer: Self-pay | Admitting: Internal Medicine

## 2019-08-22 ENCOUNTER — Telehealth: Payer: Self-pay | Admitting: Internal Medicine

## 2019-08-22 NOTE — Telephone Encounter (Signed)
Pt reports she has not been tolerating the heat well. Reports she has had several occasions where she has gotten hot , then nauseated then later she vomits and this is followed by a lot of sweating. She has not been eating very much either. Reports that yesterday when she vomited a quarter size amt of coffee ground material was in the toilet. Also states that her stools were very dark this am. Pt did take pepto bismol last night, discussed with her that it makes you have very dark stools. Pt states she is going out of town in a few days and wonders if she should be checked out. Discussed with her that she should contact her PCP or be seen at an urgent care to be evaluated and have her labs checked. Pt verbalized understanding.

## 2019-09-04 ENCOUNTER — Other Ambulatory Visit: Payer: Self-pay | Admitting: Internal Medicine

## 2019-09-28 ENCOUNTER — Ambulatory Visit: Payer: 59 | Admitting: Internal Medicine

## 2019-10-04 ENCOUNTER — Other Ambulatory Visit: Payer: Self-pay

## 2019-10-04 ENCOUNTER — Ambulatory Visit: Payer: 59 | Admitting: Internal Medicine

## 2019-10-04 ENCOUNTER — Encounter: Payer: Self-pay | Admitting: Internal Medicine

## 2019-10-04 VITALS — BP 138/88 | HR 81 | Temp 98.8°F | Ht 62.0 in | Wt 165.0 lb

## 2019-10-04 DIAGNOSIS — Z0000 Encounter for general adult medical examination without abnormal findings: Secondary | ICD-10-CM

## 2019-10-04 DIAGNOSIS — R232 Flushing: Secondary | ICD-10-CM | POA: Diagnosis not present

## 2019-10-04 DIAGNOSIS — Z23 Encounter for immunization: Secondary | ICD-10-CM

## 2019-10-04 DIAGNOSIS — F5101 Primary insomnia: Secondary | ICD-10-CM | POA: Diagnosis not present

## 2019-10-04 MED ORDER — TEMAZEPAM 7.5 MG PO CAPS: 7.5000 mg | ORAL_CAPSULE | Freq: Every evening | ORAL | 3 refills | Status: DC | PRN

## 2019-10-04 NOTE — Assessment & Plan Note (Signed)
On Effexor XR

## 2019-10-04 NOTE — Progress Notes (Signed)
Subjective:  Patient ID: Marie Pierce, female    DOB: Sep 06, 1955  Age: 64 y.o. MRN: [id number]  CC: No chief complaint on file.   HPI Marie Pierce presents for depression, insomnia, GERD f/u    Outpatient Medications Prior to Visit  Medication Sig Dispense Refill  . aspirin EC 81 MG tablet Take 81 mg by mouth daily.    [name] CALCIUM PO Take by mouth.    . esomeprazole (NEXIUM) 40 MG capsule TAKE 1 CAPSULE BY MOUTH EVERY DAY BEFORE BREAKFAST;  Office visit for further refills 30 capsule 2  . glucosamine-chondroitin 500-400 MG tablet Take by mouth.    . IBUPROFEN PO Take by mouth as needed.    . Multiple Vitamin (MULTI-VITAMIN DAILY PO) Multi Vitamin    . tamoxifen (NOLVADEX) 20 MG tablet TAKE 1 TABLET BY MOUTH EVERY DAY 90 tablet 4  . temazepam (RESTORIL) 7.5 MG capsule TAKE 1-2 CAPSULES (7.5-15 MG TOTAL) BY MOUTH AT BEDTIME AS NEEDED FOR SLEEP. 30 capsule 2  . venlafaxine XR (EFFEXOR-XR) 37.5 MG 24 hr capsule TAKE ONE CAPSULE BY MOUTH EVERY DAY WITH BREAKFAST 90 capsule 5  . VITAMIN D PO Take by mouth.    . Zinc 10 MG LOZG zinc     No facility-administered medications prior to visit.    ROS: Review of Systems  Constitutional: Negative for activity change, appetite change, chills, fatigue and unexpected weight change.  HENT: Negative for congestion, mouth sores and sinus pressure.   Eyes: Negative for visual disturbance.  Respiratory: Negative for cough and chest tightness.   Gastrointestinal: Negative for abdominal pain and nausea.  Genitourinary: Negative for difficulty urinating, frequency and vaginal pain.  Musculoskeletal: Negative for back pain and gait problem.  Skin: Negative for pallor and rash.  Neurological: Negative for dizziness, tremors, weakness, numbness and headaches.  Psychiatric/Behavioral: Negative for confusion and sleep disturbance. The patient is nervous/anxious.     Objective:  BP 138/88 (BP Location: Right Arm, Patient Position:  Sitting, Cuff Size: Large)   Pulse 81   Temp 98.8 F (37.1 C) (Oral)   Ht 5\' 2"  (1.575 m)   Wt 165 lb (74.8 kg)   SpO2 98%   BMI 30.18 kg/m   BP Readings from Last 3 Encounters:  10/04/19 138/88  03/30/19 138/76  11/15/18 (!) 145/74    Wt Readings from Last 3 Encounters:  10/04/19 165 lb (74.8 kg)  03/30/19 170 lb (77.1 kg)  11/15/18 173 lb 11.2 oz (78.8 kg)    Physical Exam Constitutional:      General: She is not in acute distress.    Appearance: She is well-developed.  HENT:     Head: Normocephalic.     Right Ear: External ear normal.     Left Ear: External ear normal.     Nose: Nose normal.  Eyes:     General:        Right eye: No discharge.        Left eye: No discharge.     Conjunctiva/sclera: Conjunctivae normal.     Pupils: Pupils are equal, round, and reactive to light.  Neck:     Thyroid: No thyromegaly.     Vascular: No JVD.     Trachea: No tracheal deviation.  Cardiovascular:     Rate and Rhythm: Normal rate and regular rhythm.     Heart sounds: Normal heart sounds.  Pulmonary:     Effort: No respiratory distress.     Breath sounds: No stridor.  No wheezing.  Abdominal:     General: Bowel sounds are normal. There is no distension.     Palpations: Abdomen is soft. There is no mass.     Tenderness: There is no abdominal tenderness. There is no guarding or rebound.  Musculoskeletal:        General: No tenderness.     Cervical back: Normal range of motion and neck supple.  Lymphadenopathy:     Cervical: No cervical adenopathy.  Skin:    Findings: No erythema or rash.  Neurological:     Cranial Nerves: No cranial nerve deficit.     Motor: No abnormal muscle tone.     Coordination: Coordination normal.     Deep Tendon Reflexes: Reflexes normal.  Psychiatric:        Behavior: Behavior normal.        Thought Content: Thought content normal.        Judgment: Judgment normal.     Lab Results  Component Value Date   WBC 4.4 11/15/2018   HGB  14.2 11/15/2018   HCT 42.3 11/15/2018   PLT 153 11/15/2018   GLUCOSE 97 03/30/2019   CHOL 158 11/15/2018   TRIG 83 11/15/2018   HDL 37 (L) 11/15/2018   LDLCALC 104 (H) 11/15/2018   ALT 29 11/15/2018   AST 23 11/15/2018   NA 141 03/30/2019   K 3.9 03/30/2019   CL 106 03/30/2019   CREATININE 0.74 03/30/2019   BUN 19 03/30/2019   CO2 28 03/30/2019   TSH 1.164 11/15/2018   INR 0.96 11/21/2015   HGBA1C 5.9 03/30/2019    MM DIAG BREAST TOMO BILATERAL  Result Date: 07/24/2019 CLINICAL DATA:  Malignant lumpectomy of the UPPER OUTER QUADRANT of the RIGHT breast in 2016 with adjuvant radiation therapy and hormonal chemoprevention with tamoxifen. Patient underwent BILATERAL reduction mammoplasty in 2017. Annual evaluation. EXAM: DIGITAL DIAGNOSTIC BILATERAL MAMMOGRAM WITH TOMO AND CAD COMPARISON:  Previous exam(s). ACR Breast Density Category c: The breast tissue is heterogeneously dense, which may obscure small masses. FINDINGS: Tomosynthesis and synthesized full field CC and MLO views of both breasts were obtained. Standard spot magnification MLO view of the lumpectomy site in the RIGHT breast was also obtained. Mild post lumpectomy scarring in the UPPER OUTER QUADRANT of the RIGHT breast, demarcated by surgical clips. Expected post surgical scar/architectural distortion related to the prior reduction mammoplasty. No new or suspicious findings in the RIGHT breast. Expected post surgical scar/architectural distortion related to the prior reduction mammoplasty involving the LEFT breast. No new or suspicious findings in the LEFT breast. Mammographic images were processed with CAD. IMPRESSION: 1. No mammographic evidence of malignancy involving either breast. 2. Expected post lumpectomy changes involving the RIGHT breast and expected post surgical changes in both breasts related to the prior reduction mammoplasty. RECOMMENDATION: As patient is now 5 years out from her lumpectomy she may return to annual  screening. Screening mammogram in one year is recommended.(Code:SM-B-01Y) I have discussed the findings and recommendations with the patient. If applicable, a reminder letter will be sent to the patient regarding the next appointment. BI-RADS CATEGORY  2: Benign. Electronically Signed   By: Evangeline Dakin M.D.   On: 07/24/2019 09:52    Assessment & Plan:   There are no diagnoses linked to this encounter.   No orders of the defined types were placed in this encounter.    Follow-up: No follow-ups on file.  Walker Kehr, MD

## 2019-10-04 NOTE — Assessment & Plan Note (Signed)
3/21 low-dose temazepam as needed  Potential benefits of a long term benzodiazepines  use as well as potential risks  and complications were explained to the patient and were aknowledged.

## 2019-10-29 ENCOUNTER — Other Ambulatory Visit: Payer: Self-pay | Admitting: Internal Medicine

## 2019-11-02 ENCOUNTER — Ambulatory Visit
Admission: RE | Admit: 2019-11-02 | Discharge: 2019-11-02 | Disposition: A | Discharge status: Home / Self Care | Payer: 59 | Source: Ambulatory Visit | Attending: Oncology | Admitting: Oncology

## 2019-11-02 ENCOUNTER — Other Ambulatory Visit: Payer: Self-pay

## 2019-11-02 DIAGNOSIS — C50411 Malignant neoplasm of upper-outer quadrant of right female breast: Secondary | ICD-10-CM

## 2019-11-02 DIAGNOSIS — Z170 Estrogen receptor positive status [ER+]: Secondary | ICD-10-CM

## 2019-11-02 MED ORDER — GADOBUTROL 1 MMOL/ML IV SOLN
8.0000 mL | Freq: Once | INTRAVENOUS | Status: AC | PRN
  Administered 2019-11-02: 8 mL via INTRAVENOUS

## 2019-11-06 ENCOUNTER — Other Ambulatory Visit: Payer: Self-pay | Admitting: Internal Medicine

## 2019-11-08 ENCOUNTER — Encounter: Payer: Self-pay | Admitting: Internal Medicine

## 2019-11-13 ENCOUNTER — Other Ambulatory Visit: Payer: Self-pay | Admitting: Internal Medicine

## 2019-11-14 NOTE — Progress Notes (Signed)
[facility]  [phone] [phone]     ID: Sharhonda Atwood DOB: [birth year]  MR#: [id number]  TML#:[id number]  Patient Care Team: Cassandria Anger, MD as PCP - General (Internal Medicine) Fontaine, Belinda Block, MD (Inactive) as Consulting Physician (Gynecology) [name], MD as Consulting Physician (General Surgery) Jadrien Narine, Virgie Dad, MD as Consulting Physician (Oncology) Deboraha Sprang, MD as Consulting Physician (Cardiology) OTHER MD:  CHIEF COMPLAINT:  Estrogen receptor positive breast cancer  CURRENT TREATMENT: Completing 5 years of tamoxifen   INTERVAL HISTORY: Marie Pierce returns today for follow-up of her estrogen receptor positive breast cancer.   She continues on tamoxifen.  She has had no significant problems from this medication.  Since her last visit, she underwent bilateral diagnostic mammography with tomography at [facility] on 07/24/2019 showing: breast density category C; no evidence of malignancy in either breast.  She also underwent breast MRI on 11/02/2019 showing: breast composition C; no evidence for malignancy.   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  She has even lost a little bit of weight.  She is not otherwise exercising.  Family is doing well.  She is now semiretired.  Detailed review of systems was otherwise stable   COVID 19 VACCINATION STATUS: Received the J&J vaccine.  Not planning on a booster at present   BREAST CANCER HISTORY:  from the original intake note:  "Marie Pierce" had bilateral screening mammography with tomography at the [facility] Center 06/18/2014. This showed a possible area of distortion in the right breast. On 06/25/2014 she was recalled for bilateral diagnostic mammography with tomography and right breast ultrasonography. The breast density was category C. In the right breast at 11:00 there was a persistent area of distortion which was not  palpable by exam. Ultrasound confirmed a subtle hypoechoic mass in the area in question measuring 0.6 cm. Ultrasound of the right axilla was normal.  On 07/03/2014 the patient underwent right breast upper outer quadrant biopsy, with the pathology ([id number]) showing invasive ductal carcinoma, grade 1, estrogen receptor 100% positive, progesterone receptor 20% positive, both with strong staining intensity, with an MIB-1 of 10%, and HER-2 pending.  Her subsequent history is as detailed below.   PAST MEDICAL HISTORY: Past Medical History:  Diagnosis Date  . Acute sinus infection 12/23/2015   finished antibiotic 12/31/2015  . Arthritis    multiple joints  . Breast asymmetry 12/2015  . Breast cancer ([place])   . Cough 01/01/2016  . Dental crowns present   . GERD (gastroesophageal reflux disease)   . History of breast cancer 2016  . History of gastric ulcer   . History of PAT (paroxysmal atrial tachycardia)    no current med.  . Personal history of radiation therapy   . PONV (postoperative nausea and vomiting)   . S/P radiation therapy     PAST SURGICAL HISTORY: Past Surgical History:  Procedure Laterality Date  . BREAST BIOPSY Left    x 2  . BREAST BIOPSY Right   . BREAST LUMPECTOMY     right 2016  . BREAST REDUCTION SURGERY Bilateral 01/03/2016   Procedure: MAMMARY REDUCTION  (BREAST);  Surgeon: Irene Limbo, MD;  Location: [facility];  Service: Plastics;  Laterality: Bilateral;  MAMMARY REDUCTION  (BREAST)  . CARDIAC CATHETERIZATION N/A 11/21/2015   Procedure: Left Heart Cath and Coronary Angiography;  Surgeon: Troy Sine, MD;  Location: [facility];  Service: Cardiovascular;  Laterality: N/A;  . CESAREAN SECTION     x 2  . COLONOSCOPY WITH PROPOFOL  01/02/2015  . ESOPHAGOGASTRODUODENOSCOPY (EGD) WITH PROPOFOL  05/20/2011  . HEMORRHOID SURGERY    . PLANTAR FASCIA SURGERY Left   . RADIOACTIVE SEED GUIDED PARTIAL MASTECTOMY WITH AXILLARY SENTINEL  LYMPH NODE BIOPSY Right 07/17/2014   Procedure: RADIOACTIVE SEED GUIDED PARTIAL MASTECTOMY WITH AXILLARY SENTINEL LYMPH NODE BIOPSY;  Surgeon: [name], MD;  Location: [facility];  Service: General;  Laterality: Right;  . REDUCTION MAMMAPLASTY     2017  . TONSILLECTOMY    . TOTAL ABDOMINAL HYSTERECTOMY W/ BILATERAL SALPINGOOPHORECTOMY  09/05/2003    FAMILY HISTORY Family History  Problem Relation Age of Onset  . Colon cancer Father 85       and dx. again at 87  . Stomach cancer Maternal Aunt 60  . Diabetes Mother   . Heart disease Mother   . Breast cancer Paternal Aunt        dx. 31s  . Breast cancer Maternal Grandmother 50  . Skin cancer Brother        not melanoma  . Colon polyps Brother        1 polyp on colonoscopy  . Breast cancer Maternal Aunt 52  . Lung cancer Maternal Uncle        smoker  . Lung cancer Maternal Uncle        smoker  . Brain cancer Maternal Uncle        tumor type unknown  . Breast cancer Cousin        underwent BL mastectomies  . Leukemia Cousin 77   The patient's parents are still living, in their early 30s per the patient had 2 brothers, no sisters. The patient's father had colon cancer at age 60. One maternal aunt (out of 36) and the maternal grandmother were both diagnosed with breast cancer over the age of 78.   GYNECOLOGIC HISTORY:  No LMP recorded. Patient has had a hysterectomy.  menarche age 64, first live birth at 64, which increases the risk of breast cancer developing. The patient is GX P2. She is  Status post TAH/BSO and did not take hormone replacement. 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 Her husband Lasel "Skip" Guallpa  Is a Chief Strategy Officer. Daughter [name]  is an Metallurgist in [place]; son [name] is studying at [employer] (Event organiser).     ADVANCED DIRECTIVES: In the absence of any documents to the contrary the patient's husband is her healthcare power of attorney   HEALTH MAINTENANCE: Social History   Tobacco Use  . Smoking status: Never Smoker  . Smokeless tobacco: Never Used  Substance Use Topics  . Alcohol use: Yes    Comment: occasionally  . Drug use: No     Colonoscopy: scheduled DEC 2016/ [facility]  PAP:  Bone density:  Lipid panel:  Allergies  Allergen Reactions  . Morphine And Related Hives  . Penicillins Swelling    SWELLING OF THROAT  . Sulfa Antibiotics Hives  . Ultram [Tramadol Hcl] Nausea And Vomiting and Other (See Comments)    DIZZINESS    Current Outpatient Medications  Medication Sig Dispense Refill  .  aspirin EC 81 MG tablet Take 81 mg by mouth daily.    [name] CALCIUM PO Take by mouth.    . esomeprazole (NEXIUM) 40 MG capsule TAKE 1 CAPSULE BY MOUTH EVERY DAY BEFORE BREAKFAST;  Office visit for further refills 30 capsule 2  . glucosamine-chondroitin 500-400 MG tablet Take by mouth.    . IBUPROFEN PO Take by mouth as needed.    . Multiple Vitamin (MULTI-VITAMIN DAILY PO) Multi Vitamin    . tamoxifen (NOLVADEX) 20 MG tablet TAKE 1 TABLET BY MOUTH EVERY DAY 90 tablet 4  . temazepam (RESTORIL) 7.5 MG capsule Take 1-2 capsules (7.5-15 mg total) by mouth at bedtime as needed for sleep. 30 capsule 3  . venlafaxine XR (EFFEXOR-XR) 37.5 MG 24 hr capsule TAKE ONE CAPSULE BY MOUTH EVERY DAY WITH BREAKFAST 90 capsule 5  . VITAMIN D PO Take by mouth.    . Zinc 10 MG LOZG zinc     No current facility-administered medications for this visit.    OBJECTIVE: White woman in no acute distress  Vitals:   11/15/19 0940  BP: (!) 150/76  Pulse: 77  Resp: 18  Temp: (!) 97.4 F (36.3 C)  SpO2: 98%     Body mass index is 30.43 kg/m.    ECOG FS:0 - Asymptomatic  Sclerae unicteric, EOMs intact Wearing a mask No  cervical or supraclavicular adenopathy Lungs no rales or rhonchi Heart regular rate and rhythm Abd soft, nontender, positive bowel sounds MSK no focal spinal tenderness, no upper extremity lymphedema Neuro: nonfocal, well oriented, appropriate affect Breasts: The right breast is status post lumpectomy and radiation with no evidence of local recurrence.  Both breasts are status post reduction mammoplasty.  Both axillae are benign.   LAB RESULTS:  CMP     Component Value Date/Time   NA 141 03/30/2019 1044   NA 142 08/13/2015 1357   K 3.9 03/30/2019 1044   K 3.9 08/13/2015 1357   CL 106 03/30/2019 1044   CO2 28 03/30/2019 1044   CO2 27 08/13/2015 1357   GLUCOSE 97 03/30/2019 1044   GLUCOSE 128 08/13/2015 1357   BUN 19 03/30/2019 1044   BUN 8.3 08/13/2015 1357   CREATININE 0.74 03/30/2019 1044   CREATININE 0.8 08/13/2015 1357   CALCIUM 9.1 03/30/2019 1044   CALCIUM 8.9 08/13/2015 1357   PROT 6.4 (L) 11/15/2018 0809   PROT 6.3 (L) 08/13/2015 1357   ALBUMIN 3.8 11/15/2018 0809   ALBUMIN 3.7 08/13/2015 1357   AST 23 11/15/2018 0809   AST 34 08/13/2015 1357   ALT 29 11/15/2018 0809   ALT 60 (H) 08/13/2015 1357   ALKPHOS 80 11/15/2018 0809   ALKPHOS 87 08/13/2015 1357   BILITOT 0.3 11/15/2018 0809   BILITOT 0.33 08/13/2015 1357   GFRNONAA >60 11/15/2018 0809   GFRAA >60 11/15/2018 0809    INo results found for: SPEP, UPEP  Lab Results  Component Value Date   WBC 5.3 11/15/2019   NEUTROABS 2.9 11/15/2019   HGB 13.8 11/15/2019   HCT 40.9 11/15/2019   MCV 96.2 11/15/2019   PLT 155 11/15/2019      Chemistry      Component Value Date/Time   NA 141 03/30/2019 1044   NA 142 08/13/2015 1357   K 3.9 03/30/2019 1044   K 3.9 08/13/2015 1357   CL 106 03/30/2019 1044   CO2 28 03/30/2019 1044   CO2 27 08/13/2015 1357   BUN 19 03/30/2019 1044   BUN 8.3  08/13/2015 1357   CREATININE 0.74 03/30/2019 1044   CREATININE 0.8 08/13/2015 1357      Component Value Date/Time    CALCIUM 9.1 03/30/2019 1044   CALCIUM 8.9 08/13/2015 1357   ALKPHOS 80 11/15/2018 0809   ALKPHOS 87 08/13/2015 1357   AST 23 11/15/2018 0809   AST 34 08/13/2015 1357   ALT 29 11/15/2018 0809   ALT 60 (H) 08/13/2015 1357   BILITOT 0.3 11/15/2018 0809   BILITOT 0.33 08/13/2015 1357       No results found for: LABCA2  No components found for: LABCA125  No results for input(s): INR in the last 168 hours.  Urinalysis No results found for: COLORURINE, APPEARANCEUR, LABSPEC, PHURINE, GLUCOSEU, HGBUR, BILIRUBINUR, KETONESUR, PROTEINUR, UROBILINOGEN, NITRITE, LEUKOCYTESUR   STUDIES: MR BREAST BILATERAL W [place] CAD  Result Date: 11/02/2019 CLINICAL DATA:  History of malignant lumpectomy of the UPPER-OUTER QUADRANT of the RIGHT breast in 2016, treated with radiation therapy and tamoxifen. Patient had bilateral reduction mammoplasty 2017. LABS:  None obtained at the time of imaging. EXAM: BILATERAL BREAST MRI WITH AND WITHOUT CONTRAST TECHNIQUE: Multiplanar, multisequence MR images of both breasts were obtained prior to and following the intravenous administration of 8 ml of Gadavist Three-dimensional MR images were rendered by post-processing of the original MR data on an independent workstation. The three-dimensional MR images were interpreted, and findings are reported in the following complete MRI report for this study. Three dimensional images were evaluated at the independent interpreting workstation using the DynaCAD thin client. COMPARISON:  MRI on 10/08/2017, mammogram on 07/24/2019 FINDINGS: Breast composition: c. Heterogeneous fibroglandular tissue. Background parenchymal enhancement: Minimal Right breast: No mass or abnormal enhancement. Postoperative distortion is identified in the UPPER-OUTER QUADRANT of the RIGHT breast from previous lumpectomy. Postoperative changes from reduction. Left breast: No mass or abnormal enhancement. Postoperative changes from prior reduction.  Lymph nodes: No abnormal appearing lymph nodes. Ancillary findings: Numerous circumscribed T2 hyperintense and T1 hypointense lesions within the liver are consistent with benign cyst. IMPRESSION: No MRI evidence for malignancy. RECOMMENDATION: Screening mammogram is recommended in July 2022. Based on ACR recommendations, annual MRI is recommended in women with personal history of breast cancer and dense breast tissue, or those diagnosed before age 70. BI-RADS CATEGORY  2: Benign. Electronically Signed   By: Nolon Nations M.D.   On: 11/02/2019 13:26     ASSESSMENT: 64 y.o.  woman status post right breast upper outer quadrant biopsy 07/03/2014 for a clinical T1b N0, stage IA invasive ductal carcinoma, grade 1, estrogen and progesterone receptor positive, with an MIB-1 of 10% and HER-2 pending.    (1) breast conserving surgery with sentinel lymph node sampling  07/17/2014 showed a pT1c pN0, stage IA  Invasive ductal carcinoma, grade 1, with ample margins. Repeat HER-2 was again negative  (2) Oncotype sent from the definitive surgical sample showed a recurrence score of 17, predicting a risk of recurrence outside the breast within 10 years of 11% if the patient's only systemic therapy is tamoxifen for 5 years. Also predicts no benefit from chemotherapy.  (3) adjuvant radiation 09/05/2014 through 10/15/2014:  Right breast 4500 cGy in 25 sessions , right breast tumor bed boost 540 cGy in 3 sessions   (4) started tamoxifen 11/06/2014, completing 5 years November 2021  (a)  Patient is status post remote TAH-BSO   PLAN: Marie Pierce is now a little over 5 years out from definitive surgery for her breast cancer with no evidence of disease recurrence.  This  is very favorable.  She has completed 5 years of tamoxifen.  We discussed possibly continuing for 5 more years but the benefit in her case is likely to be very marginal, perhaps a 1% risk  reduction.  This is not motivating to her on I am certainly comfortable with her stopping tamoxifen at this point  All she will need in terms of breast cancer follow-up is a yearly physician breast exam and yearly mammography.  She has been getting breast MRI every 2 years because of her reduction mammoplasties.  If her breast density decreases to B that will no longer be necessary  I will be glad to see Verleen at any point in the future if and when the need arises but as of now are making no further routine appointments for her here.    Total encounter time 20 minutes.Sarajane Jews C. Mady Oubre, MD 11/15/19 9:50 AM Medical Oncology and Hematology [facility] [place], [place] [address] Tel. [phone]    Fax. [phone]   I, Wilburn Mylar, am acting as scribe for Dr. Virgie Dad. Lenoir Facchini.     *Total Encounter Time as defined by the Centers for Medicare and Medicaid Services includes, in addition to the face-to-face time of a patient visit (documented in the note above) non-face-to-face time: obtaining and reviewing outside history, ordering and reviewing medications, tests or procedures, care coordination (communications with other health care professionals or caregivers) and documentation in the medical record.

## 2019-11-15 ENCOUNTER — Inpatient Hospital Stay: Payer: 59

## 2019-11-15 ENCOUNTER — Inpatient Hospital Stay: Payer: 59 | Attending: Oncology | Admitting: Oncology

## 2019-11-15 ENCOUNTER — Other Ambulatory Visit: Payer: Self-pay

## 2019-11-15 ENCOUNTER — Telehealth: Payer: Self-pay

## 2019-11-15 VITALS — BP 150/76 | HR 77 | Temp 97.4°F | Resp 18 | Ht 62.0 in | Wt 166.4 lb

## 2019-11-15 DIAGNOSIS — C50411 Malignant neoplasm of upper-outer quadrant of right female breast: Secondary | ICD-10-CM | POA: Insufficient documentation

## 2019-11-15 DIAGNOSIS — Z79810 Long term (current) use of selective estrogen receptor modulators (SERMs): Secondary | ICD-10-CM | POA: Insufficient documentation

## 2019-11-15 DIAGNOSIS — Z170 Estrogen receptor positive status [ER+]: Secondary | ICD-10-CM | POA: Insufficient documentation

## 2019-11-15 LAB — CBC WITH DIFFERENTIAL/PLATELET
Abs Immature Granulocytes: 0.01 10*3/uL (ref 0.00–0.07)
Basophils Absolute: 0 10*3/uL (ref 0.0–0.1)
Basophils Relative: 1 %
Eosinophils Absolute: 0.1 10*3/uL (ref 0.0–0.5)
Eosinophils Relative: 2 %
HCT: 40.9 % (ref 36.0–46.0)
Hemoglobin: 13.8 g/dL (ref 12.0–15.0)
Immature Granulocytes: 0 %
Lymphocytes Relative: 35 %
Lymphs Abs: 1.9 10*3/uL (ref 0.7–4.0)
MCH: 32.5 pg (ref 26.0–34.0)
MCHC: 33.7 g/dL (ref 30.0–36.0)
MCV: 96.2 fL (ref 80.0–100.0)
Monocytes Absolute: 0.4 10*3/uL (ref 0.1–1.0)
Monocytes Relative: 7 %
Neutro Abs: 2.9 10*3/uL (ref 1.7–7.7)
Neutrophils Relative %: 55 %
Platelets: 155 10*3/uL (ref 150–400)
RBC: 4.25 MIL/uL (ref 3.87–5.11)
RDW: 12.3 % (ref 11.5–15.5)
WBC: 5.3 10*3/uL (ref 4.0–10.5)
nRBC: 0 % (ref 0.0–0.2)

## 2019-11-15 LAB — COMPREHENSIVE METABOLIC PANEL
ALT: 30 U/L (ref 0–44)
AST: 25 U/L (ref 15–41)
Albumin: 3.8 g/dL (ref 3.5–5.0)
Alkaline Phosphatase: 72 U/L (ref 38–126)
Anion gap: 7 (ref 5–15)
BUN: 16 mg/dL (ref 8–23)
CO2: 28 mmol/L (ref 22–32)
Calcium: 8.6 mg/dL — ABNORMAL LOW (ref 8.9–10.3)
Chloride: 106 mmol/L (ref 98–111)
Creatinine, Ser: 0.76 mg/dL (ref 0.44–1.00)
GFR, Estimated: >60 mL/min (ref >60)
Glucose, Bld: 118 mg/dL — ABNORMAL HIGH (ref 70–99)
Potassium: 4.3 mmol/L (ref 3.5–5.1)
Sodium: 141 mmol/L (ref 135–145)
Total Bilirubin: 0.5 mg/dL (ref 0.3–1.2)
Total Protein: 6.4 g/dL — ABNORMAL LOW (ref 6.5–8.1)

## 2019-11-15 LAB — LIPID PANEL
Cholesterol: 166 mg/dL (ref 0–200)
HDL: 32 mg/dL — ABNORMAL LOW (ref >40)
LDL Cholesterol: 109 mg/dL — ABNORMAL HIGH (ref 0–99)
Total CHOL/HDL Ratio: 5.2 RATIO
Triglycerides: 124 mg/dL (ref <150)
VLDL: 25 mg/dL (ref 0–40)

## 2019-11-15 LAB — TSH: TSH: 0.9 u[IU]/mL (ref 0.308–3.960)

## 2019-11-15 NOTE — Telephone Encounter (Signed)
Pt called and asked if Dr Jana Hakim will continue to prescribe Effexor since her care has been released from him. This LPN let pt know that she has refills for one year, then her PCP would need to prescribe. Pt verbalized thanks and understanding.

## 2019-11-20 ENCOUNTER — Telehealth: Payer: Self-pay | Admitting: Oncology

## 2019-11-20 NOTE — Telephone Encounter (Signed)
No 11/10 los, no changes made to pt schedule  

## 2019-12-06 ENCOUNTER — Ambulatory Visit: Payer: 59 | Admitting: Gastroenterology

## 2020-03-18 LAB — HM COLONOSCOPY

## 2020-03-19 ENCOUNTER — Ambulatory Visit: Payer: 59 | Admitting: Family

## 2020-03-19 ENCOUNTER — Encounter: Payer: Self-pay | Admitting: Family

## 2020-03-19 ENCOUNTER — Telehealth: Payer: Self-pay | Admitting: Internal Medicine

## 2020-03-19 ENCOUNTER — Other Ambulatory Visit: Payer: Self-pay

## 2020-03-19 VITALS — BP 158/88 | HR 90 | Temp 98.6°F | Ht 62.0 in | Wt 164.2 lb

## 2020-03-19 DIAGNOSIS — F419 Anxiety disorder, unspecified: Secondary | ICD-10-CM

## 2020-03-19 DIAGNOSIS — R030 Elevated blood-pressure reading, without diagnosis of hypertension: Secondary | ICD-10-CM

## 2020-03-19 DIAGNOSIS — J019 Acute sinusitis, unspecified: Secondary | ICD-10-CM

## 2020-03-19 MED ORDER — DOXYCYCLINE HYCLATE 100 MG PO TABS: 100.0000 mg | ORAL_TABLET | Freq: Two times a day (BID) | ORAL | 0 refills | Status: DC

## 2020-03-19 NOTE — Telephone Encounter (Signed)
Per chart appt made for 04/02/20.Marland KitchenJohny Pierce

## 2020-03-19 NOTE — Patient Instructions (Signed)
Please start checking your blood pressure regularly as we discussed; please purchase a home cuff ( arm cuff) and bring the cuff and log to your next appointment; OMRON ( talk to your pharmacist)   [email or web address]">  DASH Eating Plan DASH stands for Dietary Approaches to Stop Hypertension. The DASH eating plan is a healthy eating plan that has been shown to:  Reduce high blood pressure (hypertension).  Reduce your risk for type 2 diabetes, heart disease, and stroke.  Help with weight loss. What are tips for following this plan? Reading food labels  Check food labels for the amount of salt (sodium) per serving. Choose foods with less than 5 percent of the Daily Value of sodium. Generally, foods with less than 300 milligrams (mg) of sodium per serving fit into this eating plan.  To find whole grains, look for the word "whole" as the first word in the ingredient list. Shopping  Buy products labeled as "low-sodium" or "no salt added."  Buy fresh foods. Avoid canned foods and pre-made or frozen meals. Cooking  Avoid adding salt when cooking. Use salt-free seasonings or herbs instead of table salt or sea salt. Check with your health care provider or pharmacist before using salt substitutes.  Do not fry foods. Cook foods using healthy methods such as baking, boiling, grilling, roasting, and broiling instead.  Cook with heart-healthy oils, such as olive, canola, avocado, soybean, or sunflower oil. Meal planning  Eat a balanced diet that includes: ? 4 or more servings of fruits and 4 or more servings of vegetables each day. Try to fill one-half of your plate with fruits and vegetables. ? 6-8 servings of whole grains each day. ? Less than 6 oz (170 g) of lean meat, poultry, or fish each day. A 3-oz (85-g) serving of meat is about the same size as a deck of cards. One egg equals 1 oz (28 g). ? 2-3 servings of low-fat dairy each day. One  serving is 1 cup (237 mL). ? 1 serving of nuts, seeds, or beans 5 times each week. ? 2-3 servings of heart-healthy fats. Healthy fats called omega-3 fatty acids are found in foods such as walnuts, flaxseeds, fortified milks, and eggs. These fats are also found in cold-water fish, such as sardines, salmon, and mackerel.  Limit how much you eat of: ? Canned or prepackaged foods. ? Food that is high in trans fat, such as some fried foods. ? Food that is high in saturated fat, such as fatty meat. ? Desserts and other sweets, sugary drinks, and other foods with added sugar. ? Full-fat dairy products.  Do not salt foods before eating.  Do not eat more than 4 egg yolks a week.  Try to eat at least 2 vegetarian meals a week.  Eat more home-cooked food and less restaurant, buffet, and fast food.   Lifestyle  When eating at a restaurant, ask that your food be prepared with less salt or no salt, if possible.  If you drink alcohol: ? Limit how much you use to:  0-1 drink a day for women who are not pregnant.  0-2 drinks a day for men. ? Be aware of how much alcohol is in your drink. In the [place] one drink equals one 12 oz bottle of beer (355 mL), one 5 oz glass of wine (148 mL), or one 1 oz glass of hard liquor (44 mL). General information  Avoid eating more than 2,300 mg of salt a day. If you have hypertension, you  may need to reduce your sodium intake to 1,500 mg a day.  Work with your health care provider to maintain a healthy body weight or to lose weight. Ask what an ideal weight is for you.  Get at least 30 minutes of exercise that causes your heart to beat faster (aerobic exercise) most days of the week. Activities may include walking, swimming, or biking.  Work with your health care provider or dietitian to adjust your eating plan to your individual calorie needs. What foods should I eat? Fruits All fresh, dried, or frozen fruit. Canned fruit in natural juice (without added  sugar). Vegetables Fresh or frozen vegetables (raw, steamed, roasted, or grilled). Low-sodium or reduced-sodium tomato and vegetable juice. Low-sodium or reduced-sodium tomato sauce and tomato paste. Low-sodium or reduced-sodium canned vegetables. Grains Whole-grain or whole-wheat bread. Whole-grain or whole-wheat pasta. Brown rice. [place]. Bulgur. Whole-grain and low-sodium cereals. Pita bread. Low-fat, low-sodium crackers. Whole-wheat flour tortillas. Meats and other proteins Skinless chicken or [place]. Ground chicken or [place]. Pork with fat trimmed off. Fish and seafood. Egg whites. Dried beans, peas, or lentils. Unsalted nuts, nut butters, and seeds. Unsalted canned beans. Lean cuts of beef with fat trimmed off. Low-sodium, lean precooked or cured meat, such as sausages or meat loaves. Dairy Low-fat (1%) or fat-free (skim) milk. Reduced-fat, low-fat, or fat-free cheeses. Nonfat, low-sodium ricotta or cottage cheese. Low-fat or nonfat yogurt. Low-fat, low-sodium cheese. Fats and oils Soft margarine without trans fats. Vegetable oil. Reduced-fat, low-fat, or light mayonnaise and salad dressings (reduced-sodium). Canola, safflower, olive, avocado, soybean, and sunflower oils. Avocado. Seasonings and condiments Herbs. Spices. Seasoning mixes without salt. Other foods Unsalted popcorn and pretzels. Fat-free sweets. The items listed above may not be a complete list of foods and beverages you can eat. Contact a dietitian for more information. What foods should I avoid? Fruits Canned fruit in a light or heavy syrup. Fried fruit. Fruit in cream or butter sauce. Vegetables Creamed or fried vegetables. Vegetables in a cheese sauce. Regular canned vegetables (not low-sodium or reduced-sodium). Regular canned tomato sauce and paste (not low-sodium or reduced-sodium). Regular tomato and vegetable juice (not low-sodium or reduced-sodium). Angie Fava. Olives. Grains Baked goods made with fat, such  as croissants, muffins, or some breads. Dry pasta or rice meal packs. Meats and other proteins Fatty cuts of meat. Ribs. Fried meat. Berniece Salines. Bologna, salami, and other precooked or cured meats, such as sausages or meat loaves. Fat from the back of a pig (fatback). Bratwurst. Salted nuts and seeds. Canned beans with added salt. Canned or smoked fish. Whole eggs or egg yolks. Chicken or [place] with skin. Dairy Whole or 2% milk, cream, and half-and-half. Whole or full-fat cream cheese. Whole-fat or sweetened yogurt. Full-fat cheese. Nondairy creamers. Whipped toppings. Processed cheese and cheese spreads. Fats and oils Butter. Stick margarine. Lard. Shortening. Ghee. Bacon fat. Tropical oils, such as coconut, palm kernel, or palm oil. Seasonings and condiments Onion salt, garlic salt, seasoned salt, table salt, and sea salt. Worcestershire sauce. Tartar sauce. Barbecue sauce. Teriyaki sauce. Soy sauce, including reduced-sodium. Steak sauce. Canned and packaged gravies. Fish sauce. Oyster sauce. Cocktail sauce. Store-bought horseradish. Ketchup. Mustard. Meat flavorings and tenderizers. Bouillon cubes. Hot sauces. Pre-made or packaged marinades. Pre-made or packaged taco seasonings. Relishes. Regular salad dressings. Other foods Salted popcorn and pretzels. The items listed above may not be a complete list of foods and beverages you should avoid. Contact a dietitian for more information. Where to find more information  National Heart, Lung, and  Blood Institute: https://wilson-eaton.com/  American Heart Association: www.heart.org  Academy of Nutrition and Dietetics: www.eatright.[place]: www.kidney.org Summary  The DASH eating plan is a healthy eating plan that has been shown to reduce high blood pressure (hypertension). It may also reduce your risk for type 2 diabetes, heart disease, and stroke.  When on the DASH eating plan, aim to eat more fresh fruits and vegetables, whole  grains, lean proteins, low-fat dairy, and heart-healthy fats.  With the DASH eating plan, you should limit salt (sodium) intake to 2,300 mg a day. If you have hypertension, you may need to reduce your sodium intake to 1,500 mg a day.  Work with your health care provider or dietitian to adjust your eating plan to your individual calorie needs. This information is not intended to replace advice given to you by your health care provider. Make sure you discuss any questions you have with your health care provider. Document Revised: 11/25/2018 Document Reviewed: 11/25/2018 Elsevier Patient Education  2021 [employer].

## 2020-03-19 NOTE — Telephone Encounter (Signed)
[facility]: Caller states she has had high B/P and headaches behind he right eye for a few days. Had a colonoscopy yesterday and had a high B/P then 189/107. No B/P machine to check B/P now. Headache is a 7 out of 10 pain scale. Has been taking Sudafed for 2 weeks for sinus congestion, states it does help. No fever. States she has not been diagnosed with hypertension   [facility] recommended to see PCP within 24 hours; PT has an appointment with L. Valere Dross

## 2020-03-19 NOTE — Telephone Encounter (Signed)
Patient is calling, complaining of high blood pressure, yesterday during her colonoscopy yesterday she saw it was 189/100 and she has had some really bad headaches. Transferred to [facility] for further eval.

## 2020-03-19 NOTE — Progress Notes (Signed)
Marie Pierce is a 65 y.o. female with the following history as recorded in EpicCare:  Patient Active Problem List   Diagnosis Date Noted  . Hot flashes 04/02/2019  . Insomnia 04/02/2019  . SVT (supraventricular tachycardia) ([place])   . Elevated troponin 11/19/2015  . Genetic testing 08/08/2014  . Family history of breast cancer in female 08/08/2014  . Family history of colon cancer 08/08/2014  . Malignant neoplasm of upper-outer quadrant of right breast in female, estrogen receptor positive ([place]) 07/06/2014  . Abdominal pain, epigastric 05/18/2010  . INCREASED BLOOD PRESSURE 11/28/2008    Current Outpatient Medications  Medication Sig Dispense Refill  . aspirin EC 81 MG tablet Take 81 mg by mouth daily.    [name] CALCIUM PO Take by mouth.    . doxycycline (VIBRA-TABS) 100 MG tablet Take 1 tablet (100 mg total) by mouth 2 (two) times daily. 20 tablet 0  . esomeprazole (NEXIUM) 40 MG capsule TAKE 1 CAPSULE BY MOUTH EVERY DAY BEFORE BREAKFAST;  Office visit for further refills 30 capsule 2  . IBUPROFEN PO Take by mouth as needed.    . Multiple Vitamin (MULTI-VITAMIN DAILY PO) Multi Vitamin    . temazepam (RESTORIL) 7.5 MG capsule Take 1-2 capsules (7.5-15 mg total) by mouth at bedtime as needed for sleep. 30 capsule 3  . venlafaxine XR (EFFEXOR-XR) 37.5 MG 24 hr capsule 1 capsule with food    . VITAMIN D PO Take by mouth.    . Zinc 10 MG LOZG zinc    . B Complex Vitamins (VITAMIN B COMPLEX) TABS See admin instructions.    [name] glucosamine-chondroitin 500-400 MG tablet Take by mouth. (Patient not taking: Reported on 03/19/2020)     No current facility-administered medications for this visit.    Allergies: Morphine and related, Penicillins, Sulfa antibiotics, and Ultram [tramadol hcl]  Past Medical History:  Diagnosis Date  . Acute sinus infection 12/23/2015   finished antibiotic 12/31/2015  . Arthritis    multiple joints  . Breast asymmetry 12/2015  . Breast cancer ([place])   . Cough  01/01/2016  . Dental crowns present   . GERD (gastroesophageal reflux disease)   . History of breast cancer 2016  . History of gastric ulcer   . History of PAT (paroxysmal atrial tachycardia)    no current med.  . Personal history of radiation therapy   . PONV (postoperative nausea and vomiting)   . S/P radiation therapy     Past Surgical History:  Procedure Laterality Date  . BREAST BIOPSY Left    x 2  . BREAST BIOPSY Right   . BREAST LUMPECTOMY     right 2016  . BREAST REDUCTION SURGERY Bilateral 01/03/2016   Procedure: MAMMARY REDUCTION  (BREAST);  Surgeon: Irene Limbo, MD;  Location: [facility];  Service: Plastics;  Laterality: Bilateral;  MAMMARY REDUCTION  (BREAST)  . CARDIAC CATHETERIZATION N/A 11/21/2015   Procedure: Left Heart Cath and Coronary Angiography;  Surgeon: Troy Sine, MD;  Location: [facility];  Service: Cardiovascular;  Laterality: N/A;  . CESAREAN SECTION     x 2  . COLONOSCOPY WITH PROPOFOL  01/02/2015  . ESOPHAGOGASTRODUODENOSCOPY (EGD) WITH PROPOFOL  05/20/2011  . HEMORRHOID SURGERY    . PLANTAR FASCIA SURGERY Left   . RADIOACTIVE SEED GUIDED PARTIAL MASTECTOMY WITH AXILLARY SENTINEL LYMPH NODE BIOPSY Right 07/17/2014   Procedure: RADIOACTIVE SEED GUIDED PARTIAL MASTECTOMY WITH AXILLARY SENTINEL LYMPH NODE BIOPSY;  Surgeon: [name], MD;  Location:  [facility];  Service: General;  Laterality: Right;  . REDUCTION MAMMAPLASTY     2017  . TONSILLECTOMY    . TOTAL ABDOMINAL HYSTERECTOMY W/ BILATERAL SALPINGOOPHORECTOMY  09/05/2003    Family History  Problem Relation Age of Onset  . Colon cancer Father 63       and dx. again at 68  . Stomach cancer Maternal Aunt 60  . Diabetes Mother   . Heart disease Mother   . Breast cancer Paternal Aunt        dx. 63s  . Breast cancer Maternal Grandmother 71  . Skin cancer Brother        not melanoma  . Colon polyps Brother        1 polyp on colonoscopy  . Breast  cancer Maternal Aunt 52  . Lung cancer Maternal Uncle        smoker  . Lung cancer Maternal Uncle        smoker  . Brain cancer Maternal Uncle        tumor type unknown  . Breast cancer Cousin        underwent BL mastectomies  . Leukemia Cousin 62    Social History   Tobacco Use  . Smoking status: Never Smoker  . Smokeless tobacco: Never Used  Substance Use Topics  . Alcohol use: Yes    Comment: occasionally    Subjective:   Patient had a colonoscopy yesterday and her blood pressure was noted to be elevated while procedure 180/100; no prior history of hypertension but feels that her blood pressure has been up some when seen at doctor appointments recently; Admits that stress level has been high as well; Also concerned that she has a sinus infection- had COVID approximately one month ago and symptoms have developed since that time;      Objective:  Vitals:   03/19/20 1229  BP: (!) 158/88  Pulse: 90  Temp: 98.6 F (37 C)  TempSrc: Temporal  SpO2: 97%  Weight: 164 lb 3.2 oz (74.5 kg)  Height: 5\' 2"  (1.575 m)    General: Well developed, well nourished, in no acute distress  Skin : Warm and dry.  Head: Normocephalic and atraumatic  Eyes: Sclera and conjunctiva clear; pupils round and reactive to light; extraocular movements intact  Ears: External normal; canals clear; tympanic membranes normal  Oropharynx: Pink, supple. No suspicious lesions  Neck: Supple without thyromegaly, adenopathy  Lungs: Respirations unlabored; clear to auscultation bilaterally without wheeze, rales, rhonchi  CVS exam: normal rate and regular rhythm.  Musculoskeletal: No deformities; no active joint inflammation  Extremities: No edema, cyanosis, clubbing  Vessels: Symmetric bilaterally  Neurologic: Alert and oriented; speech intact; face symmetrical; moves all extremities well; CNII-XII intact without focal deficit   Assessment:  1. Elevated BP without diagnosis of hypertension   2. Acute  sinusitis, recurrence not specified, unspecified location   3. Anxiety     Plan:  1. EKG shows NSR; patient will start checking her pressure and discuss with her PCP at upcoming follow up; do not feel medication warranted today; DASH diet discussed; 2. Rx for Doxycycline 100 mg bid x 10 days; 3. Was started on Effexor XR 37.5 mg for hot flashes; feels has helped with mood some but wonders about higher dosage; okay to increase to 75 mg and plan to discuss with PCP at OV in 2 weeks;  This visit occurred during the SARS-CoV-2 public health emergency.  Safety protocols were in place, including  screening questions prior to the visit, additional usage of staff PPE, and extensive cleaning of exam room while observing appropriate contact time as indicated for disinfecting solutions.     No follow-ups on file.  Orders Placed This Encounter  Procedures  . EKG 12-Lead    Requested Prescriptions   Signed Prescriptions Disp Refills  . doxycycline (VIBRA-TABS) 100 MG tablet 20 tablet 0    Sig: Take 1 tablet (100 mg total) by mouth 2 (two) times daily.

## 2020-03-24 ENCOUNTER — Other Ambulatory Visit: Payer: Self-pay | Admitting: Internal Medicine

## 2020-04-01 ENCOUNTER — Other Ambulatory Visit: Payer: Self-pay

## 2020-04-02 ENCOUNTER — Ambulatory Visit (INDEPENDENT_AMBULATORY_CARE_PROVIDER_SITE_OTHER): Payer: 59 | Admitting: Internal Medicine

## 2020-04-02 ENCOUNTER — Encounter: Payer: Self-pay | Admitting: Internal Medicine

## 2020-04-02 VITALS — BP 114/72 | HR 91 | Temp 98.7°F | Ht 62.0 in | Wt 168.2 lb

## 2020-04-02 DIAGNOSIS — K219 Gastro-esophageal reflux disease without esophagitis: Secondary | ICD-10-CM

## 2020-04-02 DIAGNOSIS — R232 Flushing: Secondary | ICD-10-CM

## 2020-04-02 DIAGNOSIS — F5101 Primary insomnia: Secondary | ICD-10-CM | POA: Diagnosis not present

## 2020-04-02 DIAGNOSIS — Z800 Family history of malignant neoplasm of digestive organs: Secondary | ICD-10-CM | POA: Diagnosis not present

## 2020-04-02 DIAGNOSIS — K635 Polyp of colon: Secondary | ICD-10-CM

## 2020-04-02 MED ORDER — VENLAFAXINE HCL 75 MG PO TABS
75.0000 mg | ORAL_TABLET | Freq: Every day | ORAL | 3 refills | Status: DC
Start: 2020-04-02 — End: 2021-02-26

## 2020-04-02 MED ORDER — TEMAZEPAM 7.5 MG PO CAPS: 7.5000 mg | ORAL_CAPSULE | Freq: Every evening | ORAL | 1 refills | Status: DC | PRN

## 2020-04-02 NOTE — Assessment & Plan Note (Signed)
On Effexor 

## 2020-04-02 NOTE — Assessment & Plan Note (Signed)
Colon 3/22 - polyp. F/u w/Dr Michail Sermon - colon due  in 2027

## 2020-04-02 NOTE — Assessment & Plan Note (Signed)
Chronic - on Nexium

## 2020-04-02 NOTE — Assessment & Plan Note (Addendum)
  Colon 3/22 - polyp. F/u w/Dr Michail Sermon - colon due  in 2027

## 2020-04-02 NOTE — Progress Notes (Signed)
Subjective:  Patient ID: Marie Pierce, female    DOB: May 29, 1955  Age: 65 y.o. MRN: [id number]  CC: Annual Exam   HPI Marie Pierce presents for insomnia, hot flashes C/o elevated BP x2 wks, SBP 160-170 - ?? BP monitor is not working   Outpatient Medications Prior to Visit  Medication Sig Dispense Refill  . aspirin EC 81 MG tablet Take 81 mg by mouth daily.    . B Complex Vitamins (VITAMIN B COMPLEX) TABS See admin instructions.    [name] CALCIUM PO Take by mouth.    . esomeprazole (NEXIUM) 40 MG capsule TAKE 1 CAPSULE BY MOUTH EVERY DAY BEFORE BREAKFAST;  Office visit for further refills 30 capsule 2  . IBUPROFEN PO Take by mouth as needed.    . Multiple Vitamin (MULTI-VITAMIN DAILY PO) Multi Vitamin    . temazepam (RESTORIL) 7.5 MG capsule TAKE 1-2 CAPSULES (7.5-15 MG TOTAL) BY MOUTH AT BEDTIME AS NEEDED FOR SLEEP. 30 capsule 1  . venlafaxine (EFFEXOR) 75 MG tablet Take 75 mg by mouth daily.    [name] VITAMIN D PO Take by mouth.    . Zinc 10 MG LOZG zinc    . doxycycline (VIBRA-TABS) 100 MG tablet Take 1 tablet (100 mg total) by mouth 2 (two) times daily. (Patient not taking: Reported on 04/02/2020) 20 tablet 0  . glucosamine-chondroitin 500-400 MG tablet Take by mouth. (Patient not taking: No sig reported)    . venlafaxine XR (EFFEXOR-XR) 37.5 MG 24 hr capsule 1 capsule with food     No facility-administered medications prior to visit.    ROS: Review of Systems  Constitutional: Positive for unexpected weight change. Negative for activity change, appetite change, chills and fatigue.  HENT: Negative for congestion, mouth sores and sinus pressure.   Eyes: Negative for visual disturbance.  Respiratory: Negative for cough and chest tightness.   Gastrointestinal: Negative for abdominal pain and nausea.  Genitourinary: Negative for difficulty urinating, frequency and vaginal pain.  Musculoskeletal: Negative for back pain and gait problem.  Skin: Negative for pallor and  rash.  Neurological: Negative for dizziness, tremors, weakness, numbness and headaches.  Psychiatric/Behavioral: Negative for confusion, sleep disturbance and suicidal ideas.    Objective:  BP 114/72 (BP Location: Left Arm)   Pulse 91   Temp 98.7 F (37.1 C) (Oral)   Ht 5\' 2"  (1.575 m)   Wt 168 lb 3.2 oz (76.3 kg)   SpO2 98%   BMI 30.76 kg/m   BP Readings from Last 3 Encounters:  04/02/20 114/72  03/19/20 (!) 158/88  11/15/19 (!) 150/76    Wt Readings from Last 3 Encounters:  04/02/20 168 lb 3.2 oz (76.3 kg)  03/19/20 164 lb 3.2 oz (74.5 kg)  11/15/19 166 lb 6.4 oz (75.5 kg)    Physical Exam Constitutional:      General: She is not in acute distress.    Appearance: She is well-developed. She is obese.  HENT:     Head: Normocephalic.     Right Ear: External ear normal.     Left Ear: External ear normal.     Nose: Nose normal.  Eyes:     General:        Right eye: No discharge.        Left eye: No discharge.     Conjunctiva/sclera: Conjunctivae normal.     Pupils: Pupils are equal, round, and reactive to light.  Neck:     Thyroid: No thyromegaly.  Vascular: No JVD.     Trachea: No tracheal deviation.  Cardiovascular:     Rate and Rhythm: Normal rate and regular rhythm.     Heart sounds: Normal heart sounds.  Pulmonary:     Effort: No respiratory distress.     Breath sounds: No stridor. No wheezing.  Abdominal:     General: Bowel sounds are normal. There is no distension.     Palpations: Abdomen is soft. There is no mass.     Tenderness: There is no abdominal tenderness. There is no guarding or rebound.  Musculoskeletal:        General: No tenderness.     Cervical back: Normal range of motion and neck supple.  Lymphadenopathy:     Cervical: No cervical adenopathy.  Skin:    Findings: No erythema or rash.  Neurological:     Cranial Nerves: No cranial nerve deficit.     Motor: No abnormal muscle tone.     Coordination: Coordination normal.     Deep  Tendon Reflexes: Reflexes normal.  Psychiatric:        Behavior: Behavior normal.        Thought Content: Thought content normal.        Judgment: Judgment normal.     Lab Results  Component Value Date   WBC 5.3 11/15/2019   HGB 13.8 11/15/2019   HCT 40.9 11/15/2019   PLT 155 11/15/2019   GLUCOSE 118 (H) 11/15/2019   CHOL 166 11/15/2019   TRIG 124 11/15/2019   HDL 32 (L) 11/15/2019   LDLCALC 109 (H) 11/15/2019   ALT 30 11/15/2019   AST 25 11/15/2019   NA 141 11/15/2019   K 4.3 11/15/2019   CL 106 11/15/2019   CREATININE 0.76 11/15/2019   BUN 16 11/15/2019   CO2 28 11/15/2019   TSH 0.900 11/15/2019   INR 0.96 11/21/2015   HGBA1C 5.9 03/30/2019    MR BREAST BILATERAL W WO CONTRAST INC CAD  Result Date: 11/02/2019 CLINICAL DATA:  History of malignant lumpectomy of the UPPER-OUTER QUADRANT of the RIGHT breast in 2016, treated with radiation therapy and tamoxifen. Patient had bilateral reduction mammoplasty 2017. LABS:  None obtained at the time of imaging. EXAM: BILATERAL BREAST MRI WITH AND WITHOUT CONTRAST TECHNIQUE: Multiplanar, multisequence MR images of both breasts were obtained prior to and following the intravenous administration of 8 ml of Gadavist Three-dimensional MR images were rendered by post-processing of the original MR data on an independent workstation. The three-dimensional MR images were interpreted, and findings are reported in the following complete MRI report for this study. Three dimensional images were evaluated at the independent interpreting workstation using the DynaCAD thin client. COMPARISON:  MRI on 10/08/2017, mammogram on 07/24/2019 FINDINGS: Breast composition: c. Heterogeneous fibroglandular tissue. Background parenchymal enhancement: Minimal Right breast: No mass or abnormal enhancement. Postoperative distortion is identified in the UPPER-OUTER QUADRANT of the RIGHT breast from previous lumpectomy. Postoperative changes from reduction. Left breast:  No mass or abnormal enhancement. Postoperative changes from prior reduction. Lymph nodes: No abnormal appearing lymph nodes. Ancillary findings: Numerous circumscribed T2 hyperintense and T1 hypointense lesions within the liver are consistent with benign cyst. IMPRESSION: No MRI evidence for malignancy. RECOMMENDATION: Screening mammogram is recommended in July 2022. Based on ACR recommendations, annual MRI is recommended in women with personal history of breast cancer and dense breast tissue, or those diagnosed before age 70. BI-RADS CATEGORY  2: Benign. Electronically Signed   By: Nolon Nations M.D.   On:  11/02/2019 13:26    Assessment & Plan:    Walker Kehr, MD

## 2020-04-05 ENCOUNTER — Encounter: Payer: Self-pay | Admitting: Internal Medicine

## 2020-05-25 ENCOUNTER — Other Ambulatory Visit: Payer: Self-pay | Admitting: Internal Medicine

## 2020-07-17 ENCOUNTER — Ambulatory Visit: Payer: 59 | Admitting: Internal Medicine

## 2020-07-22 ENCOUNTER — Other Ambulatory Visit: Payer: Self-pay | Admitting: Internal Medicine

## 2020-07-22 DIAGNOSIS — Z1231 Encounter for screening mammogram for malignant neoplasm of breast: Secondary | ICD-10-CM

## 2020-07-22 DIAGNOSIS — Z853 Personal history of malignant neoplasm of breast: Secondary | ICD-10-CM

## 2020-07-23 ENCOUNTER — Ambulatory Visit: Payer: 59 | Admitting: Internal Medicine

## 2020-07-24 ENCOUNTER — Ambulatory Visit
Admission: RE | Admit: 2020-07-24 | Discharge: 2020-07-24 | Disposition: A | Discharge status: Home / Self Care | Payer: 59 | Source: Ambulatory Visit | Attending: Internal Medicine | Admitting: Internal Medicine

## 2020-07-24 ENCOUNTER — Other Ambulatory Visit: Payer: Self-pay

## 2020-07-24 DIAGNOSIS — Z853 Personal history of malignant neoplasm of breast: Secondary | ICD-10-CM

## 2020-07-24 DIAGNOSIS — Z1231 Encounter for screening mammogram for malignant neoplasm of breast: Secondary | ICD-10-CM

## 2020-07-26 ENCOUNTER — Telehealth: Payer: Self-pay | Admitting: Internal Medicine

## 2020-07-26 ENCOUNTER — Other Ambulatory Visit: Payer: Self-pay | Admitting: Internal Medicine

## 2020-07-26 DIAGNOSIS — R928 Other abnormal and inconclusive findings on diagnostic imaging of breast: Secondary | ICD-10-CM

## 2020-07-26 NOTE — Telephone Encounter (Signed)
   Please call patient to discuss mammogram results

## 2020-07-30 MED ORDER — TEMAZEPAM 7.5 MG PO CAPS
7.5000 mg | ORAL_CAPSULE | Freq: Every evening | ORAL | 1 refills | Status: DC | PRN
Start: 2020-07-30 — End: 2020-08-27

## 2020-07-30 NOTE — Telephone Encounter (Signed)
Okay.  Thanks.

## 2020-07-30 NOTE — Telephone Encounter (Signed)
Read mammogram impression to pt.  Pt has diagnostic mammogram scheduled for tomorrow.  Pt verb understanding.  Pt also requesting Temazepam Last RF per controlled substance database: 05/29/20 Last OV: 04/02/20 Next OV: none scheduled.

## 2020-07-31 ENCOUNTER — Ambulatory Visit: Payer: 59

## 2020-07-31 ENCOUNTER — Ambulatory Visit
Admission: RE | Admit: 2020-07-31 | Discharge: 2020-07-31 | Disposition: A | Discharge status: Home / Self Care | Payer: 59 | Source: Ambulatory Visit | Attending: Internal Medicine | Admitting: Internal Medicine

## 2020-07-31 ENCOUNTER — Other Ambulatory Visit: Payer: Self-pay

## 2020-07-31 DIAGNOSIS — R928 Other abnormal and inconclusive findings on diagnostic imaging of breast: Secondary | ICD-10-CM

## 2020-08-12 ENCOUNTER — Other Ambulatory Visit: Payer: Self-pay | Admitting: Internal Medicine

## 2020-08-12 DIAGNOSIS — Z853 Personal history of malignant neoplasm of breast: Secondary | ICD-10-CM

## 2020-08-12 DIAGNOSIS — L905 Scar conditions and fibrosis of skin: Secondary | ICD-10-CM

## 2020-08-14 ENCOUNTER — Telehealth: Payer: Self-pay | Admitting: *Deleted

## 2020-08-14 NOTE — Telephone Encounter (Signed)
Order faxed back to breast center.Marland KitchenJohny Pierce

## 2020-08-14 NOTE — Telephone Encounter (Signed)
-----   Message from Marguarite Arbour sent at 08/13/2020 10:46 AM EDT ----- [facility]

## 2020-08-21 ENCOUNTER — Other Ambulatory Visit (INDEPENDENT_AMBULATORY_CARE_PROVIDER_SITE_OTHER): Payer: 59

## 2020-08-21 DIAGNOSIS — F5101 Primary insomnia: Secondary | ICD-10-CM

## 2020-08-21 LAB — COMPREHENSIVE METABOLIC PANEL
ALT: 21 U/L (ref 0–35)
AST: 20 U/L (ref 0–37)
Albumin: 4.4 g/dL (ref 3.5–5.2)
Alkaline Phosphatase: 112 U/L (ref 39–117)
BUN: 16 mg/dL (ref 6–23)
CO2: 29 mEq/L (ref 19–32)
Calcium: 9.4 mg/dL (ref 8.4–10.5)
Chloride: 102 mEq/L (ref 96–112)
Creatinine, Ser: 0.64 mg/dL (ref 0.40–1.20)
GFR: 93.22 mL/min (ref >60.00)
Glucose, Bld: 109 mg/dL — ABNORMAL HIGH (ref 70–99)
Potassium: 4.1 mEq/L (ref 3.5–5.1)
Sodium: 138 mEq/L (ref 135–145)
Total Bilirubin: 0.4 mg/dL (ref 0.2–1.2)
Total Protein: 7 g/dL (ref 6.0–8.3)

## 2020-08-21 LAB — LIPID PANEL
Cholesterol: 204 mg/dL — ABNORMAL HIGH (ref 0–200)
HDL: 39.9 mg/dL (ref >39.00)
LDL Cholesterol: 131 mg/dL — ABNORMAL HIGH (ref 0–99)
NonHDL: 164.26
Total CHOL/HDL Ratio: 5
Triglycerides: 168 mg/dL — ABNORMAL HIGH (ref 0.0–149.0)
VLDL: 33.6 mg/dL (ref 0.0–40.0)

## 2020-08-21 LAB — CBC WITH DIFFERENTIAL/PLATELET
Basophils Absolute: 0 10*3/uL (ref 0.0–0.1)
Basophils Relative: 0.7 % (ref 0.0–3.0)
Eosinophils Absolute: 0.1 10*3/uL (ref 0.0–0.7)
Eosinophils Relative: 2.3 % (ref 0.0–5.0)
HCT: 42.5 % (ref 36.0–46.0)
Hemoglobin: 14.3 g/dL (ref 12.0–15.0)
Lymphocytes Relative: 29.2 % (ref 12.0–46.0)
Lymphs Abs: 1.6 10*3/uL (ref 0.7–4.0)
MCHC: 33.7 g/dL (ref 30.0–36.0)
MCV: 93.3 fl (ref 78.0–100.0)
Monocytes Absolute: 0.4 10*3/uL (ref 0.1–1.0)
Monocytes Relative: 6.8 % (ref 3.0–12.0)
Neutro Abs: 3.3 10*3/uL (ref 1.4–7.7)
Neutrophils Relative %: 61 % (ref 43.0–77.0)
Platelets: 170 10*3/uL (ref 150.0–400.0)
RBC: 4.56 Mil/uL (ref 3.87–5.11)
RDW: 13.1 % (ref 11.5–15.5)
WBC: 5.4 10*3/uL (ref 4.0–10.5)

## 2020-08-21 LAB — URINALYSIS
Bilirubin Urine: NEGATIVE
Hgb urine dipstick: NEGATIVE
Ketones, ur: NEGATIVE
Leukocytes,Ua: NEGATIVE
Nitrite: NEGATIVE
Specific Gravity, Urine: <=1.005 — AB (ref 1.000–1.030)
Total Protein, Urine: NEGATIVE
Urine Glucose: NEGATIVE
Urobilinogen, UA: 0.2 (ref 0.0–1.0)
pH: 6.5 (ref 5.0–8.0)

## 2020-08-21 LAB — TSH: TSH: 0.98 u[IU]/mL (ref 0.35–5.50)

## 2020-08-27 ENCOUNTER — Ambulatory Visit: Payer: 59 | Admitting: Internal Medicine

## 2020-08-27 ENCOUNTER — Encounter: Payer: Self-pay | Admitting: Internal Medicine

## 2020-08-27 ENCOUNTER — Other Ambulatory Visit: Payer: Self-pay

## 2020-08-27 DIAGNOSIS — I471 Supraventricular tachycardia: Secondary | ICD-10-CM

## 2020-08-27 DIAGNOSIS — R232 Flushing: Secondary | ICD-10-CM | POA: Diagnosis not present

## 2020-08-27 DIAGNOSIS — F5101 Primary insomnia: Secondary | ICD-10-CM

## 2020-08-27 DIAGNOSIS — E785 Hyperlipidemia, unspecified: Secondary | ICD-10-CM

## 2020-08-27 DIAGNOSIS — Z803 Family history of malignant neoplasm of breast: Secondary | ICD-10-CM

## 2020-08-27 DIAGNOSIS — K219 Gastro-esophageal reflux disease without esophagitis: Secondary | ICD-10-CM

## 2020-08-27 MED ORDER — TEMAZEPAM 7.5 MG PO CAPS: 7.5000 mg | ORAL_CAPSULE | Freq: Every evening | ORAL | 3 refills | Status: DC | PRN

## 2020-08-27 NOTE — Assessment & Plan Note (Signed)
Off Tamoxifen

## 2020-08-27 NOTE — Progress Notes (Signed)
Subjective:  Patient ID: Marie Pierce, female    DOB: 1955/06/18  Age: 65 y.o. MRN: [id number]  CC: Follow-up (6 month f/u) and Medication Refill (Requesting refill on temazepam. Would like quantity increase. She states she takes 2 at bedtime)   HPI Marie Pierce presents for GERD, insomnia, depression  Outpatient Medications Prior to Visit  Medication Sig Dispense Refill   aspirin EC 81 MG tablet Take 81 mg by mouth daily.     B Complex Vitamins (VITAMIN B COMPLEX) TABS See admin instructions.     CALCIUM PO Take by mouth.     esomeprazole (NEXIUM) 40 MG capsule TAKE 1 CAPSULE BY MOUTH EVERY DAY BEFORE BREAKFAST;  Office visit for further refills 30 capsule 2   IBUPROFEN PO Take by mouth as needed.     Multiple Vitamin (MULTI-VITAMIN DAILY PO) Multi Vitamin     venlafaxine (EFFEXOR) 75 MG tablet Take 1 tablet (75 mg total) by mouth daily. 90 tablet 3   VITAMIN D PO Take by mouth.     Zinc 10 MG LOZG zinc     temazepam (RESTORIL) 7.5 MG capsule Take 1-2 capsules (7.5-15 mg total) by mouth at bedtime as needed for sleep. 30 capsule 1   No facility-administered medications prior to visit.    ROS: Review of Systems  Constitutional:  Negative for activity change, appetite change, chills, fatigue and unexpected weight change.  HENT:  Negative for congestion, mouth sores and sinus pressure.   Eyes:  Negative for visual disturbance.  Respiratory:  Negative for cough and chest tightness.   Gastrointestinal:  Negative for abdominal pain and nausea.  Genitourinary:  Negative for difficulty urinating, frequency and vaginal pain.  Musculoskeletal:  Negative for back pain and gait problem.  Skin:  Negative for pallor and rash.  Neurological:  Negative for dizziness, tremors, weakness, numbness and headaches.  Psychiatric/Behavioral:  Negative for confusion, sleep disturbance and suicidal ideas.    Objective:  BP (!) 142/82 (BP Location: Left Arm)   Pulse 81   Temp 98.9  F (37.2 C) (Oral)   Ht '5\' 2"'$  (1.575 m)   Wt 166 lb 6.4 oz (75.5 kg)   SpO2 96%   BMI 30.43 kg/m   BP Readings from Last 3 Encounters:  08/27/20 (!) 142/82  04/02/20 114/72  03/19/20 (!) 158/88    Wt Readings from Last 3 Encounters:  08/27/20 166 lb 6.4 oz (75.5 kg)  04/02/20 168 lb 3.2 oz (76.3 kg)  03/19/20 164 lb 3.2 oz (74.5 kg)    Physical Exam Pulmonary:     Breath sounds: Wheezing present.    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 Recall from a screening mammography, possible mass associated with subtle architectural distortion in the upper breast at posterior depth, visible only on the MLO view, adjacent to the lumpectomy site. EXAM: DIGITAL DIAGNOSTIC UNILATERAL RIGHT MAMMOGRAM WITH TOMOSYNTHESIS AND CAD TECHNIQUE: Right digital diagnostic mammography and breast tomosynthesis was performed. The images were evaluated with computer-aided detection. COMPARISON:  Previous exams, including Breast MRI 11/02/2019. ACR Breast Density Category c: The breast tissue is heterogeneously dense, which may obscure small masses. FINDINGS: Spot-compression MLO view of the area of concern and a full field mediolateral view were obtained. There is no persistent mass on the spot compression view or on  the full field mediolateral view. The architectural distortion in the retroareolar location at posterior depth extending into the upper breast is likely related to the prior reduction mammoplasty. IMPRESSION: Likely benign architectural distortion, likely related to the scar from the prior reduction mammoplasty. RECOMMENDATION: 1. BILATERAL Breast MRI is recommended to confirm benignity of the presumed surgical scar in the RIGHT breast and to exclude an occult lesion that might account for the architectural distortion. 2. If the Breast MRI is negative, per protocol, as the patient is now 2 or more years status post lumpectomy, she may return to annual screening mammography in 1 year. However, given the history of breast cancer, the patient remains eligible for annual diagnostic mammography if preferred. I have discussed the findings and recommendations with the patient. If applicable, a reminder letter will be sent to the patient regarding the next appointment. BI-RADS CATEGORY  3: Probably benign. Electronically Signed   By: Evangeline Dakin M.D.   On: 07/31/2020 10:02   Assessment & Plan:   There are no diagnoses linked to this encounter.   Meds ordered this encounter  Medications   temazepam (RESTORIL) 7.5 MG capsule    Sig: Take 1-2 capsules (7.5-15 mg total) by mouth at bedtime as needed for sleep.    Dispense:  60 capsule    Refill:  3    Not to exceed 2 additional fills before 09/23/2020     Follow-up: No follow-ups on file.  Walker Kehr, MD

## 2020-08-27 NOTE — Assessment & Plan Note (Signed)
Potential benefits of a long term PPI use as well as potential risks  and complications were explained to the patient and were aknowledged.

## 2020-08-27 NOTE — Assessment & Plan Note (Signed)
2017 normal heart cath On diet

## 2020-08-27 NOTE — Assessment & Plan Note (Signed)
2017 normal heart cath

## 2020-08-27 NOTE — Assessment & Plan Note (Signed)
Multifactorial Cont w/low-dose temazepam as needed  Potential benefits of a long term benzodiazepines  use as well as potential risks  and complications were explained to the patient and were aknowledged.

## 2020-08-27 NOTE — Patient Instructions (Signed)
GERD

## 2020-08-27 NOTE — Assessment & Plan Note (Signed)
Resolved off Tamoxifen

## 2020-08-31 ENCOUNTER — Other Ambulatory Visit: Payer: Self-pay

## 2020-08-31 ENCOUNTER — Ambulatory Visit
Admission: RE | Admit: 2020-08-31 | Discharge: 2020-08-31 | Disposition: A | Discharge status: Home / Self Care | Payer: 59 | Source: Ambulatory Visit | Attending: Internal Medicine | Admitting: Internal Medicine

## 2020-08-31 DIAGNOSIS — Z853 Personal history of malignant neoplasm of breast: Secondary | ICD-10-CM

## 2020-08-31 MED ORDER — GADOBUTROL 1 MMOL/ML IV SOLN
10.0000 mL | Freq: Once | INTRAVENOUS | Status: AC | PRN
  Administered 2020-08-31: 10 mL via INTRAVENOUS

## 2020-09-13 ENCOUNTER — Telehealth: Payer: Self-pay

## 2020-09-13 NOTE — Telephone Encounter (Signed)
Please advise as the pt has called asking about her results from her MRI of her breast. Pt states it has been over a week and she is concerned.  **Please advise as PCP is out of the office.

## 2020-09-13 NOTE — Telephone Encounter (Signed)
MRI shows -   No MRI evidence of malignancy.   RECOMMENDATION: Recommend annual screening mammography in July of 2023.

## 2020-09-13 NOTE — Telephone Encounter (Signed)
Results given.

## 2020-10-16 ENCOUNTER — Other Ambulatory Visit: Payer: Self-pay | Admitting: Oncology

## 2020-11-09 ENCOUNTER — Other Ambulatory Visit: Payer: Self-pay | Admitting: Oncology

## 2020-11-18 ENCOUNTER — Telehealth: Payer: Self-pay | Admitting: Internal Medicine

## 2020-11-18 NOTE — Telephone Encounter (Signed)
Marie Pierce needs to be fully recovered after her flu before she can get a flu vaccine.  So "2 weeks" is the correct recommendation.  Thanks

## 2020-11-18 NOTE — Telephone Encounter (Signed)
Patient calling in about flu shot   Patient says she tested positive for flu around 1 week ago  Patient was told from [facility] that she had to wait 2 weeks before getting Flu vaccination but employer is requesting she has it done by tomorrow 11/15  Patient wants to know if she needs to wait or if she is okay to get vaccination since its been a week  Please call [phone]

## 2020-11-19 NOTE — Telephone Encounter (Signed)
Notified pt w/MD response.../lmb 

## 2021-02-26 ENCOUNTER — Other Ambulatory Visit: Payer: Self-pay

## 2021-02-26 ENCOUNTER — Encounter: Payer: Self-pay | Admitting: Internal Medicine

## 2021-02-26 ENCOUNTER — Ambulatory Visit (INDEPENDENT_AMBULATORY_CARE_PROVIDER_SITE_OTHER): Payer: 59 | Admitting: Internal Medicine

## 2021-02-26 VITALS — BP 130/84 | HR 84 | Temp 98.6°F | Ht 62.0 in | Wt 172.0 lb

## 2021-02-26 DIAGNOSIS — K219 Gastro-esophageal reflux disease without esophagitis: Secondary | ICD-10-CM

## 2021-02-26 DIAGNOSIS — E785 Hyperlipidemia, unspecified: Secondary | ICD-10-CM

## 2021-02-26 DIAGNOSIS — M2550 Pain in unspecified joint: Secondary | ICD-10-CM

## 2021-02-26 DIAGNOSIS — R739 Hyperglycemia, unspecified: Secondary | ICD-10-CM

## 2021-02-26 DIAGNOSIS — F5101 Primary insomnia: Secondary | ICD-10-CM

## 2021-02-26 LAB — CBC WITH DIFFERENTIAL/PLATELET
Basophils Absolute: 0 10*3/uL (ref 0.0–0.1)
Basophils Relative: 0.7 % (ref 0.0–3.0)
Eosinophils Absolute: 0.1 10*3/uL (ref 0.0–0.7)
Eosinophils Relative: 2.4 % (ref 0.0–5.0)
HCT: 40.5 % (ref 36.0–46.0)
Hemoglobin: 13.7 g/dL (ref 12.0–15.0)
Lymphocytes Relative: 31.9 % (ref 12.0–46.0)
Lymphs Abs: 1.6 10*3/uL (ref 0.7–4.0)
MCHC: 33.9 g/dL (ref 30.0–36.0)
MCV: 93.3 fl (ref 78.0–100.0)
Monocytes Absolute: 0.4 10*3/uL (ref 0.1–1.0)
Monocytes Relative: 7.1 % (ref 3.0–12.0)
Neutro Abs: 2.9 10*3/uL (ref 1.4–7.7)
Neutrophils Relative %: 57.9 % (ref 43.0–77.0)
Platelets: 189 10*3/uL (ref 150.0–400.0)
RBC: 4.34 Mil/uL (ref 3.87–5.11)
RDW: 12.7 % (ref 11.5–15.5)
WBC: 5.1 10*3/uL (ref 4.0–10.5)

## 2021-02-26 LAB — COMPREHENSIVE METABOLIC PANEL
ALT: 21 U/L (ref 0–35)
AST: 19 U/L (ref 0–37)
Albumin: 4.6 g/dL (ref 3.5–5.2)
Alkaline Phosphatase: 97 U/L (ref 39–117)
BUN: 14 mg/dL (ref 6–23)
CO2: 35 mEq/L — ABNORMAL HIGH (ref 19–32)
Calcium: 9.6 mg/dL (ref 8.4–10.5)
Chloride: 102 mEq/L (ref 96–112)
Creatinine, Ser: 0.67 mg/dL (ref 0.40–1.20)
GFR: 91.86 mL/min (ref >60.00)
Glucose, Bld: 91 mg/dL (ref 70–99)
Potassium: 4.1 mEq/L (ref 3.5–5.1)
Sodium: 139 mEq/L (ref 135–145)
Total Bilirubin: 0.3 mg/dL (ref 0.2–1.2)
Total Protein: 7.1 g/dL (ref 6.0–8.3)

## 2021-02-26 LAB — SEDIMENTATION RATE: Sed Rate: 15 mm/hr (ref 0–30)

## 2021-02-26 LAB — TSH: TSH: 1.12 u[IU]/mL (ref 0.35–5.50)

## 2021-02-26 LAB — HEMOGLOBIN A1C: Hgb A1c MFr Bld: 6.2 % (ref 4.6–6.5)

## 2021-02-26 MED ORDER — ESOMEPRAZOLE MAGNESIUM 40 MG PO CPDR: DELAYED_RELEASE_CAPSULE | ORAL | 3 refills | Status: DC

## 2021-02-26 MED ORDER — VENLAFAXINE HCL 75 MG PO TABS: 75.0000 mg | ORAL_TABLET | Freq: Every day | ORAL | 3 refills | Status: DC

## 2021-02-26 MED ORDER — TEMAZEPAM 7.5 MG PO CAPS
7.5000 mg | ORAL_CAPSULE | Freq: Every evening | ORAL | 1 refills | Status: DC | PRN
Start: 2021-02-26 — End: 2021-09-18

## 2021-02-26 NOTE — Assessment & Plan Note (Addendum)
Chronic - UEs shoulders, elbows 4 - pain is up to 7-8/10 Using Ibuprofen prn

## 2021-02-26 NOTE — Assessment & Plan Note (Signed)
Chronic - on Nexium

## 2021-02-26 NOTE — Progress Notes (Signed)
Subjective:  Patient ID: Marie Pierce, female    DOB: 1955-10-14  Age: 66 y.o. MRN: [id number]  CC: Follow-up   HPI Marie Pierce presents for insomnia, elevated glucose C/o severe pain in arm.  Chronic - UEs shoulders, elbows 4 - pain is up to 7-8/10 Using Ibuprofen prn  Outpatient Medications Prior to Visit  Medication Sig Dispense Refill   aspirin EC 81 MG tablet Take 81 mg by mouth daily.     B Complex Vitamins (VITAMIN B COMPLEX) TABS See admin instructions.     CALCIUM PO Take by mouth.     IBUPROFEN PO Take by mouth as needed.     Multiple Vitamin (MULTI-VITAMIN DAILY PO) Multi Vitamin     VITAMIN D PO Take by mouth.     Zinc 10 MG LOZG zinc     esomeprazole (NEXIUM) 40 MG capsule TAKE 1 CAPSULE BY MOUTH EVERY DAY BEFORE BREAKFAST;  Office visit for further refills 30 capsule 2   temazepam (RESTORIL) 7.5 MG capsule Take 1-2 capsules (7.5-15 mg total) by mouth at bedtime as needed for sleep. 60 capsule 3   venlafaxine (EFFEXOR) 75 MG tablet Take 1 tablet (75 mg total) by mouth daily. 90 tablet 3   venlafaxine XR (EFFEXOR-XR) 37.5 MG 24 hr capsule TAKE ONE CAPSULE BY MOUTH EVERY DAY WITH BREAKFAST 90 capsule 6   No facility-administered medications prior to visit.    ROS: Review of Systems  Constitutional:  Negative for activity change, appetite change, chills, fatigue and unexpected weight change.  HENT:  Negative for congestion, mouth sores and sinus pressure.   Eyes:  Negative for visual disturbance.  Respiratory:  Negative for cough and chest tightness.   Gastrointestinal:  Negative for abdominal pain and nausea.  Genitourinary:  Negative for difficulty urinating, frequency and vaginal pain.  Musculoskeletal:  Positive for arthralgias. Negative for back pain and gait problem.  Skin:  Negative for pallor and rash.  Neurological:  Negative for dizziness, tremors, weakness, numbness and headaches.  Psychiatric/Behavioral:  Negative for confusion and  sleep disturbance.    Objective:  BP 130/84    Pulse 84    Temp 98.6 F (37 C) (Oral)    Ht 5\' 2"  (1.575 m)    Wt 172 lb (78 kg)    SpO2 98%    BMI 31.46 kg/m   BP Readings from Last 3 Encounters:  02/26/21 130/84  08/27/20 (!) 142/82  04/02/20 114/72    Wt Readings from Last 3 Encounters:  02/26/21 172 lb (78 kg)  08/27/20 166 lb 6.4 oz (75.5 kg)  04/02/20 168 lb 3.2 oz (76.3 kg)    Physical Exam Constitutional:      General: She is not in acute distress.    Appearance: She is well-developed.  HENT:     Head: Normocephalic.     Right Ear: External ear normal.     Left Ear: External ear normal.     Nose: Nose normal.  Eyes:     General:        Right eye: No discharge.        Left eye: No discharge.     Conjunctiva/sclera: Conjunctivae normal.     Pupils: Pupils are equal, round, and reactive to light.  Neck:     Thyroid: No thyromegaly.     Vascular: No JVD.     Trachea: No tracheal deviation.  Cardiovascular:     Rate and Rhythm: Normal rate and regular rhythm.  Heart sounds: Normal heart sounds.  Pulmonary:     Effort: No respiratory distress.     Breath sounds: No stridor. No wheezing.  Abdominal:     General: Bowel sounds are normal. There is no distension.     Palpations: Abdomen is soft. There is no mass.     Tenderness: There is no abdominal tenderness. There is no guarding or rebound.  Musculoskeletal:        General: Tenderness present.     Cervical back: Normal range of motion and neck supple. No rigidity.  Lymphadenopathy:     Cervical: No cervical adenopathy.  Skin:    Findings: No erythema or rash.  Neurological:     Mental Status: She is oriented to person, place, and time.     Cranial Nerves: No cranial nerve deficit.     Motor: No abnormal muscle tone.     Coordination: Coordination normal.     Deep Tendon Reflexes: Reflexes normal.  Psychiatric:        Behavior: Behavior normal.        Thought Content: Thought content normal.         Judgment: Judgment normal.   B shoulders, hands - pain w/ROM  Lab Results  Component Value Date   WBC 5.4 08/21/2020   HGB 14.3 08/21/2020   HCT 42.5 08/21/2020   PLT 170.0 08/21/2020   GLUCOSE 109 (H) 08/21/2020   CHOL 204 (H) 08/21/2020   TRIG 168.0 (H) 08/21/2020   HDL 39.90 08/21/2020   LDLCALC 131 (H) 08/21/2020   ALT 21 08/21/2020   AST 20 08/21/2020   NA 138 08/21/2020   K 4.1 08/21/2020   CL 102 08/21/2020   CREATININE 0.64 08/21/2020   BUN 16 08/21/2020   CO2 29 08/21/2020   TSH 0.98 08/21/2020   INR 0.96 11/21/2015   HGBA1C 5.9 03/30/2019    MR BREAST BILATERAL W WO CONTRAST INC CAD  Result Date: 09/03/2020 CLINICAL DATA:  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 The patient had bilateral reduction mammoplasty in 2017. Family history of breast cancer. Patient's genetic results were negative. LABS:  None EXAM: BILATERAL BREAST MRI WITH AND WITHOUT CONTRAST TECHNIQUE: Multiplanar, multisequence MR images of both breasts were obtained prior to and following the intravenous administration of 10 ml of Gadavist Three-dimensional MR images were rendered by post-processing of the original MR data on an independent workstation. The three-dimensional MR images were interpreted, and findings are reported in the following complete MRI report for this study. Three dimensional images were evaluated at the independent interpreting workstation using the DynaCAD thin client. COMPARISON:  Previous exam(s). FINDINGS: Breast composition: c. Heterogeneous fibroglandular tissue. Background parenchymal enhancement: Minimal Right breast: No mass or abnormal enhancement. Left breast: No mass or abnormal enhancement. Lymph nodes: No abnormal appearing lymph nodes. Ancillary findings:  None. IMPRESSION: No MRI evidence of malignancy. RECOMMENDATION: Recommend annual screening mammography in July of 2023. Based  on ACR recommendations, annual breast MRI is recommended in women with personal history of breast cancer and dense breast tissue, or those diagnosed before the age of 92. BI-RADS CATEGORY  1: Negative. Electronically Signed   By: Dorise Bullion III M.D.   On: 09/03/2020 15:14   Assessment & Plan:   Problem List Items Addressed This Visit     Arthralgia    Chronic - UEs shoulders, elbows 4 - pain is up to 7-8/10 Using Ibuprofen  prn      Relevant Orders   CBC with Differential/Platelet   Comprehensive metabolic panel   Hemoglobin A1c   Rheumatoid factor   Sedimentation rate   TSH   Dyslipidemia - Primary   Relevant Orders   TSH   GERD (gastroesophageal reflux disease)    Chronic - on Nexium      Relevant Medications   esomeprazole (NEXIUM) 40 MG capsule   Insomnia    On low-dose temazepam as needed      Other Visit Diagnoses     Hyperglycemia       Relevant Orders   Hemoglobin A1c   Sedimentation rate         Meds ordered this encounter  Medications   venlafaxine (EFFEXOR) 75 MG tablet    Sig: Take 1 tablet (75 mg total) by mouth daily.    Dispense:  90 tablet    Refill:  3   temazepam (RESTORIL) 7.5 MG capsule    Sig: Take 1-2 capsules (7.5-15 mg total) by mouth at bedtime as needed for sleep.    Dispense:  180 capsule    Refill:  1    Not to exceed 2 additional fills before 09/23/2020   esomeprazole (NEXIUM) 40 MG capsule    Sig: TAKE 1 CAPSULE BY MOUTH EVERY DAY BEFORE BREAKFAST;  Office visit for further refills    Dispense:  90 capsule    Refill:  3    Office visit for further refills      Follow-up: Return in about 3 months (around 05/26/2021) for a follow-up visit.  Walker Kehr, MD

## 2021-02-26 NOTE — Assessment & Plan Note (Signed)
On low-dose temazepam as needed

## 2021-02-27 LAB — RHEUMATOID FACTOR: Rheumatoid fact SerPl-aCnc: <14 IU/mL (ref <14)

## 2021-02-28 ENCOUNTER — Telehealth: Payer: Self-pay

## 2021-02-28 NOTE — Telephone Encounter (Signed)
Pt is wondering can she return in 61months for a F/U instead of 3 months.  Please advise

## 2021-02-28 NOTE — Telephone Encounter (Signed)
My apologies for accidentally selecting your name Dr. Sharlet Salina. It was not intended for you

## 2021-03-04 NOTE — Telephone Encounter (Signed)
ROV 6 months is okay.  Thank you

## 2021-03-04 NOTE — Telephone Encounter (Signed)
I was able to r/s pt to Aug 24th for a 58mnth f/u at 9:30 am with Dr. Alain Marion.

## 2021-05-28 ENCOUNTER — Ambulatory Visit: Payer: 59 | Admitting: Internal Medicine

## 2021-08-28 ENCOUNTER — Ambulatory Visit: Payer: 59 | Admitting: Internal Medicine

## 2021-09-12 ENCOUNTER — Other Ambulatory Visit: Payer: Self-pay | Admitting: Internal Medicine

## 2021-09-15 NOTE — Telephone Encounter (Signed)
Check [place] registry last filled 07/19/2021.Marland KitchenJohny Pierce

## 2021-09-17 NOTE — Telephone Encounter (Signed)
PT calls today in regards to status on this RX request.

## 2021-10-08 ENCOUNTER — Other Ambulatory Visit: Payer: Self-pay | Admitting: Internal Medicine

## 2021-10-08 DIAGNOSIS — Z1231 Encounter for screening mammogram for malignant neoplasm of breast: Secondary | ICD-10-CM

## 2021-10-13 ENCOUNTER — Telehealth: Payer: Self-pay | Admitting: Internal Medicine

## 2021-10-13 NOTE — Telephone Encounter (Signed)
Patient needs you to call her and let her know if the tdap can be done at her next appointment.

## 2021-10-14 NOTE — Telephone Encounter (Signed)
Called pt [name] was given back 2021.[name] last for 10 years. No need for [name].Marland KitchenJohny Pierce

## 2021-10-29 ENCOUNTER — Encounter: Payer: Self-pay | Admitting: Internal Medicine

## 2021-10-29 ENCOUNTER — Ambulatory Visit: Payer: Commercial Managed Care - PPO | Admitting: Internal Medicine

## 2021-10-29 VITALS — BP 138/80 | HR 82 | Temp 98.7°F | Ht 62.0 in | Wt 167.2 lb

## 2021-10-29 DIAGNOSIS — Z23 Encounter for immunization: Secondary | ICD-10-CM | POA: Diagnosis not present

## 2021-10-29 DIAGNOSIS — R232 Flushing: Secondary | ICD-10-CM | POA: Diagnosis not present

## 2021-10-29 DIAGNOSIS — Z0000 Encounter for general adult medical examination without abnormal findings: Secondary | ICD-10-CM | POA: Diagnosis not present

## 2021-10-29 DIAGNOSIS — M2550 Pain in unspecified joint: Secondary | ICD-10-CM

## 2021-10-29 DIAGNOSIS — F5101 Primary insomnia: Secondary | ICD-10-CM | POA: Diagnosis not present

## 2021-10-29 MED ORDER — VENLAFAXINE HCL 75 MG PO TABS: 75.0000 mg | ORAL_TABLET | Freq: Every day | ORAL | 3 refills | Status: DC

## 2021-10-29 MED ORDER — TEMAZEPAM 7.5 MG PO CAPS: 7.5000 mg | ORAL_CAPSULE | Freq: Every evening | ORAL | 1 refills | Status: DC | PRN

## 2021-10-29 NOTE — Assessment & Plan Note (Signed)
On low-dose temazepam as needed  Potential benefits of a long term benzodiazepines  use as well as potential risks  and complications were explained to the patient and were aknowledged.

## 2021-10-29 NOTE — Progress Notes (Signed)
Subjective:  Patient ID: Marie Pierce, female    DOB: 1955/09/25  Age: 66 y.o. MRN: [id number]  CC: Follow-up (6 MONTH F/U- FLU SHOT)   HPI Marie Pierce presents for insomnia, hot flashes, arthralgias  Outpatient Medications Prior to Visit  Medication Sig Dispense Refill   aspirin EC 81 MG tablet Take 81 mg by mouth daily.     B Complex Vitamins (VITAMIN B COMPLEX) TABS See admin instructions.     CALCIUM PO Take by mouth.     esomeprazole (NEXIUM) 40 MG capsule TAKE 1 CAPSULE BY MOUTH EVERY DAY BEFORE BREAKFAST;  Office visit for further refills 90 capsule 3   IBUPROFEN PO Take by mouth as needed.     Multiple Vitamin (MULTI-VITAMIN DAILY PO) Multi Vitamin     VITAMIN D PO Take by mouth.     Zinc 10 MG LOZG zinc     temazepam (RESTORIL) 7.5 MG capsule TAKE 1-2 CAPSULES (7.5-15 MG TOTAL) BY MOUTH AT BEDTIME AS NEEDED FOR SLEEP. 60 capsule 1   venlafaxine (EFFEXOR) 75 MG tablet Take 1 tablet (75 mg total) by mouth daily. 90 tablet 3   No facility-administered medications prior to visit.    ROS: Review of Systems  Constitutional:  Negative for activity change, appetite change, chills, fatigue and unexpected weight change.  HENT:  Negative for congestion, mouth sores and sinus pressure.   Eyes:  Negative for visual disturbance.  Respiratory:  Negative for cough and chest tightness.   Gastrointestinal:  Negative for abdominal pain and nausea.  Genitourinary:  Negative for difficulty urinating, frequency and vaginal pain.  Musculoskeletal:  Positive for arthralgias. Negative for back pain and gait problem.  Skin:  Negative for pallor and rash.  Neurological:  Negative for dizziness, tremors, weakness, numbness and headaches.  Psychiatric/Behavioral:  Negative for confusion, sleep disturbance and suicidal ideas. The patient is nervous/anxious.     Objective:  BP 138/80 (BP Location: Left Arm)   Pulse 82   Temp 98.7 F (37.1 C) (Oral)   Ht '5\' 2"'$  (1.575 m)    Wt 167 lb 3.2 oz (75.8 kg)   SpO2 96%   BMI 30.58 kg/m   BP Readings from Last 3 Encounters:  10/29/21 138/80  02/26/21 130/84  08/27/20 (!) 142/82    Wt Readings from Last 3 Encounters:  10/29/21 167 lb 3.2 oz (75.8 kg)  02/26/21 172 lb (78 kg)  08/27/20 166 lb 6.4 oz (75.5 kg)    Physical Exam Constitutional:      General: She is not in acute distress.    Appearance: She is well-developed. She is obese.  HENT:     Head: Normocephalic.     Right Ear: External ear normal.     Left Ear: External ear normal.     Nose: Nose normal.  Eyes:     General:        Right eye: No discharge.        Left eye: No discharge.     Conjunctiva/sclera: Conjunctivae normal.     Pupils: Pupils are equal, round, and reactive to light.  Neck:     Thyroid: No thyromegaly.     Vascular: No JVD.     Trachea: No tracheal deviation.  Cardiovascular:     Rate and Rhythm: Normal rate and regular rhythm.     Heart sounds: Normal heart sounds.  Pulmonary:     Effort: No respiratory distress.     Breath sounds: No stridor. No wheezing.  Abdominal:     General: Bowel sounds are normal. There is no distension.     Palpations: Abdomen is soft. There is no mass.     Tenderness: There is no abdominal tenderness. There is no guarding or rebound.  Musculoskeletal:        General: Tenderness present.     Cervical back: Normal range of motion and neck supple. No rigidity.  Lymphadenopathy:     Cervical: No cervical adenopathy.  Skin:    Findings: No erythema or rash.  Neurological:     Cranial Nerves: No cranial nerve deficit.     Motor: No abnormal muscle tone.     Coordination: Coordination normal.     Deep Tendon Reflexes: Reflexes normal.  Psychiatric:        Behavior: Behavior normal.        Thought Content: Thought content normal.        Judgment: Judgment normal.   Hands with osteoarthritis  Lab Results  Component Value Date   WBC 5.1 02/26/2021   HGB 13.7 02/26/2021   HCT 40.5  02/26/2021   PLT 189.0 02/26/2021   GLUCOSE 91 02/26/2021   CHOL 204 (H) 08/21/2020   TRIG 168.0 (H) 08/21/2020   HDL 39.90 08/21/2020   LDLCALC 131 (H) 08/21/2020   ALT 21 02/26/2021   AST 19 02/26/2021   NA 139 02/26/2021   K 4.1 02/26/2021   CL 102 02/26/2021   CREATININE 0.67 02/26/2021   BUN 14 02/26/2021   CO2 35 (H) 02/26/2021   TSH 1.12 02/26/2021   INR 0.96 11/21/2015   HGBA1C 6.2 02/26/2021    MR BREAST BILATERAL W WO CONTRAST INC CAD  Result Date: 09/03/2020 CLINICAL DATA:  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 The patient had bilateral reduction mammoplasty in 2017. Family history of breast cancer. Patient's genetic results were negative. LABS:  None EXAM: BILATERAL BREAST MRI WITH AND WITHOUT CONTRAST TECHNIQUE: Multiplanar, multisequence MR images of both breasts were obtained prior to and following the intravenous administration of 10 ml of Gadavist Three-dimensional MR images were rendered by post-processing of the original MR data on an independent workstation. The three-dimensional MR images were interpreted, and findings are reported in the following complete MRI report for this study. Three dimensional images were evaluated at the independent interpreting workstation using the DynaCAD thin client. COMPARISON:  Previous exam(s). FINDINGS: Breast composition: c. Heterogeneous fibroglandular tissue. Background parenchymal enhancement: Minimal Right breast: No mass or abnormal enhancement. Left breast: No mass or abnormal enhancement. Lymph nodes: No abnormal appearing lymph nodes. Ancillary findings:  None. IMPRESSION: No MRI evidence of malignancy. RECOMMENDATION: Recommend annual screening mammography in July of 2023. Based on ACR recommendations, annual breast MRI is recommended in women with personal history of breast cancer and dense breast tissue, or those diagnosed before the  age of 31. BI-RADS CATEGORY  1: Negative. Electronically Signed   By: Dorise Bullion III M.D.   On: 09/03/2020 15:14   Assessment & Plan:   Problem List Items Addressed This Visit     Arthralgia    Jenny Reichmann is using ibuprofen as needed PC.  Topical creams discussed      Hot flashes    Continue on Effexor      Insomnia    On low-dose temazepam as needed  Potential benefits of a long term benzodiazepines  use as well as potential risks  and complications were  explained to the patient and were aknowledged.      Other Visit Diagnoses     Needs flu shot    -  Primary   Relevant Orders   Flu Vaccine QUAD High Dose(Fluad) (Completed)   Well adult exam       Relevant Orders   TSH   Urinalysis   CBC with Differential/Platelet   Lipid panel   Comprehensive metabolic panel         Meds ordered this encounter  Medications   temazepam (RESTORIL) 7.5 MG capsule    Sig: Take 1-2 capsules (7.5-15 mg total) by mouth at bedtime as needed for sleep.    Dispense:  180 capsule    Refill:  1    This request is for a new prescription for a controlled substance as required by Federal/State law.   venlafaxine (EFFEXOR) 75 MG tablet    Sig: Take 1 tablet (75 mg total) by mouth daily.    Dispense:  90 tablet    Refill:  3      Follow-up: Return in about 6 months (around 04/30/2022) for Wellness Exam.  Walker Kehr, MD

## 2021-11-02 NOTE — Assessment & Plan Note (Signed)
Continue on Effexor

## 2021-11-02 NOTE — Assessment & Plan Note (Signed)
John is using ibuprofen as needed PC.  Topical creams discussed

## 2021-11-04 ENCOUNTER — Ambulatory Visit
Admission: RE | Admit: 2021-11-04 | Discharge: 2021-11-04 | Disposition: A | Discharge status: Home / Self Care | Payer: Commercial Managed Care - PPO | Source: Ambulatory Visit | Attending: Internal Medicine | Admitting: Internal Medicine

## 2021-11-04 DIAGNOSIS — Z1231 Encounter for screening mammogram for malignant neoplasm of breast: Secondary | ICD-10-CM

## 2022-02-26 ENCOUNTER — Telehealth: Payer: Self-pay

## 2022-02-26 NOTE — Telephone Encounter (Signed)
Pt [facility] Infection and states they informed her to reach out to her PCP due to BP being 180/100... Pt states she is having ear pain, and sinus issues, nasal congestion, mild headache. Pt states she did not take her meds this morning and is a little stressed about being sick and possibly not being able to travel to go see her daughter.  Please advise on what pt should do. Pt was neg for COVID, STREP, FLU AND RSV. Chest x-ray was clear and pt denies N/V/D.

## 2022-03-02 NOTE — Telephone Encounter (Signed)
Please see myself or another provider this week.  Thank you

## 2022-03-03 NOTE — Telephone Encounter (Signed)
Called pt no answer LMOM w/MD response../lmb 

## 2022-03-07 ENCOUNTER — Other Ambulatory Visit: Payer: Self-pay | Admitting: Internal Medicine

## 2022-04-22 ENCOUNTER — Other Ambulatory Visit: Payer: Commercial Managed Care - PPO

## 2022-04-29 ENCOUNTER — Encounter: Payer: Commercial Managed Care - PPO | Admitting: Internal Medicine

## 2022-06-15 ENCOUNTER — Other Ambulatory Visit (INDEPENDENT_AMBULATORY_CARE_PROVIDER_SITE_OTHER): Payer: Commercial Managed Care - PPO

## 2022-06-15 DIAGNOSIS — Z0000 Encounter for general adult medical examination without abnormal findings: Secondary | ICD-10-CM | POA: Diagnosis not present

## 2022-06-15 DIAGNOSIS — Z13220 Encounter for screening for lipoid disorders: Secondary | ICD-10-CM

## 2022-06-15 LAB — URINALYSIS
Bilirubin Urine: NEGATIVE
Hgb urine dipstick: NEGATIVE
Ketones, ur: NEGATIVE
Leukocytes,Ua: NEGATIVE
Nitrite: NEGATIVE
Specific Gravity, Urine: 1.01 (ref 1.000–1.030)
Total Protein, Urine: NEGATIVE
Urine Glucose: NEGATIVE
Urobilinogen, UA: 0.2 (ref 0.0–1.0)
pH: 6 (ref 5.0–8.0)

## 2022-06-15 LAB — COMPREHENSIVE METABOLIC PANEL
ALT: 18 U/L (ref 0–35)
AST: 18 U/L (ref 0–37)
Albumin: 4.2 g/dL (ref 3.5–5.2)
Alkaline Phosphatase: 89 U/L (ref 39–117)
BUN: 17 mg/dL (ref 6–23)
CO2: 30 mEq/L (ref 19–32)
Calcium: 9.2 mg/dL (ref 8.4–10.5)
Chloride: 101 mEq/L (ref 96–112)
Creatinine, Ser: 0.62 mg/dL (ref 0.40–1.20)
GFR: 92.74 mL/min (ref >60.00)
Glucose, Bld: 124 mg/dL — ABNORMAL HIGH (ref 70–99)
Potassium: 4.2 mEq/L (ref 3.5–5.1)
Sodium: 138 mEq/L (ref 135–145)
Total Bilirubin: 0.5 mg/dL (ref 0.2–1.2)
Total Protein: 6.5 g/dL (ref 6.0–8.3)

## 2022-06-15 LAB — CBC WITH DIFFERENTIAL/PLATELET
Basophils Absolute: 0 10*3/uL (ref 0.0–0.1)
Basophils Relative: 0.7 % (ref 0.0–3.0)
Eosinophils Absolute: 0.1 10*3/uL (ref 0.0–0.7)
Eosinophils Relative: 2.4 % (ref 0.0–5.0)
HCT: 42.7 % (ref 36.0–46.0)
Hemoglobin: 14.1 g/dL (ref 12.0–15.0)
Lymphocytes Relative: 26.2 % (ref 12.0–46.0)
Lymphs Abs: 1.5 10*3/uL (ref 0.7–4.0)
MCHC: 33 g/dL (ref 30.0–36.0)
MCV: 95.2 fl (ref 78.0–100.0)
Monocytes Absolute: 0.4 10*3/uL (ref 0.1–1.0)
Monocytes Relative: 7.2 % (ref 3.0–12.0)
Neutro Abs: 3.6 10*3/uL (ref 1.4–7.7)
Neutrophils Relative %: 63.5 % (ref 43.0–77.0)
Platelets: 184 10*3/uL (ref 150.0–400.0)
RBC: 4.48 Mil/uL (ref 3.87–5.11)
RDW: 12.8 % (ref 11.5–15.5)
WBC: 5.7 10*3/uL (ref 4.0–10.5)

## 2022-06-15 LAB — LIPID PANEL
Cholesterol: 193 mg/dL (ref 0–200)
HDL: 36 mg/dL — ABNORMAL LOW (ref >39.00)
NonHDL: 156.53
Total CHOL/HDL Ratio: 5
Triglycerides: 220 mg/dL — ABNORMAL HIGH (ref 0.0–149.0)
VLDL: 44 mg/dL — ABNORMAL HIGH (ref 0.0–40.0)

## 2022-06-15 LAB — TSH: TSH: 1.47 u[IU]/mL (ref 0.35–5.50)

## 2022-06-15 LAB — LDL CHOLESTEROL, DIRECT: Direct LDL: 133 mg/dL

## 2022-06-16 ENCOUNTER — Encounter: Payer: Self-pay | Admitting: Internal Medicine

## 2022-06-16 ENCOUNTER — Ambulatory Visit (INDEPENDENT_AMBULATORY_CARE_PROVIDER_SITE_OTHER): Payer: Commercial Managed Care - PPO | Admitting: Internal Medicine

## 2022-06-16 VITALS — BP 118/62 | HR 82 | Temp 98.7°F | Ht 62.0 in | Wt 166.8 lb

## 2022-06-16 DIAGNOSIS — Z0000 Encounter for general adult medical examination without abnormal findings: Secondary | ICD-10-CM | POA: Diagnosis not present

## 2022-06-16 DIAGNOSIS — R739 Hyperglycemia, unspecified: Secondary | ICD-10-CM | POA: Diagnosis not present

## 2022-06-16 DIAGNOSIS — E785 Hyperlipidemia, unspecified: Secondary | ICD-10-CM

## 2022-06-16 DIAGNOSIS — M2550 Pain in unspecified joint: Secondary | ICD-10-CM | POA: Diagnosis not present

## 2022-06-16 MED ORDER — TRIAMCINOLONE ACETONIDE 0.1 % EX CREA: 1.0000 | TOPICAL_CREAM | Freq: Four times a day (QID) | CUTANEOUS | 1 refills | Status: AC

## 2022-06-16 NOTE — Assessment & Plan Note (Signed)

## 2022-06-16 NOTE — Assessment & Plan Note (Signed)
Using Ibuprofen prn Chronic - UEs shoulders, elbows 4 - pain is up to 7-8/10 Using Ibuprofen prn

## 2022-06-16 NOTE — Assessment & Plan Note (Signed)
2017 normal heart cath On diet 

## 2022-06-16 NOTE — Assessment & Plan Note (Signed)
Monitor labs Cont w/wt loss 

## 2022-06-16 NOTE — Progress Notes (Signed)
Subjective:  Patient ID: Marie Pierce, female    DOB: [birth year]  Age: 67 y.o. MRN: [id number]  CC: Annual Exam and Medication Refill (Need venlafaxine & Nexium)   HPI Marie Pierce presents for a well exam  Outpatient Medications Prior to Visit  Medication Sig Dispense Refill   aspirin EC 81 MG tablet Take 81 mg by mouth daily.     B Complex Vitamins (VITAMIN B COMPLEX) TABS See admin instructions.     CALCIUM PO Take by mouth.     esomeprazole (NEXIUM) 40 MG capsule TAKE 1 CAPSULE BY MOUTH EVERY DAY BEFORE BREAKFAST 30 capsule 5   IBUPROFEN PO Take by mouth as needed.     Multiple Vitamin (MULTI-VITAMIN DAILY PO) Multi Vitamin     temazepam (RESTORIL) 7.5 MG capsule Take 1-2 capsules (7.5-15 mg total) by mouth at bedtime as needed for sleep. 180 capsule 1   venlafaxine (EFFEXOR) 75 MG tablet Take 1 tablet (75 mg total) by mouth daily. 90 tablet 3   VITAMIN D PO Take by mouth.     Zinc 10 MG LOZG zinc     No facility-administered medications prior to visit.    ROS: Review of Systems  Constitutional:  Negative for activity change, appetite change, chills, fatigue and unexpected weight change.  HENT:  Negative for congestion, mouth sores and sinus pressure.   Eyes:  Negative for visual disturbance.  Respiratory:  Negative for cough and chest tightness.   Gastrointestinal:  Negative for abdominal pain and nausea.  Genitourinary:  Negative for difficulty urinating, frequency and vaginal pain.  Musculoskeletal:  Negative for back pain and gait problem.  Skin:  Negative for pallor and rash.  Neurological:  Negative for dizziness, tremors, weakness, numbness and headaches.  Psychiatric/Behavioral:  Negative for confusion, sleep disturbance and suicidal ideas.     Objective:  BP 118/62 (BP Location: Left Arm)   Pulse 82   Temp 98.7 F (37.1 C) (Oral)   Ht 5\' 2"  (1.575 m)   Wt 166 lb 12.8 oz (75.7 kg)   SpO2 97%   BMI 30.51 kg/m   BP Readings from Last 3  Encounters:  06/16/22 118/62  10/29/21 138/80  02/26/21 130/84    Wt Readings from Last 3 Encounters:  06/16/22 166 lb 12.8 oz (75.7 kg)  10/29/21 167 lb 3.2 oz (75.8 kg)  02/26/21 172 lb (78 kg)    Physical Exam Constitutional:      General: She is not in acute distress.    Appearance: She is well-developed. She is obese.  HENT:     Head: Normocephalic.     Right Ear: External ear normal.     Left Ear: External ear normal.     Nose: Nose normal.  Eyes:     General:        Right eye: No discharge.        Left eye: No discharge.     Conjunctiva/sclera: Conjunctivae normal.     Pupils: Pupils are equal, round, and reactive to light.  Neck:     Thyroid: No thyromegaly.     Vascular: No JVD.     Trachea: No tracheal deviation.  Cardiovascular:     Rate and Rhythm: Normal rate and regular rhythm.     Heart sounds: Normal heart sounds.  Pulmonary:     Effort: No respiratory distress.     Breath sounds: No stridor. No wheezing.  Abdominal:     General: Bowel sounds are normal. There  is no distension.     Palpations: Abdomen is soft. There is no mass.     Tenderness: There is no abdominal tenderness. There is no guarding or rebound.  Musculoskeletal:        General: No tenderness.     Cervical back: Normal range of motion and neck supple. No rigidity.  Lymphadenopathy:     Cervical: No cervical adenopathy.  Skin:    Findings: No erythema or rash.  Neurological:     Mental Status: She is oriented to person, place, and time.     Cranial Nerves: No cranial nerve deficit.     Motor: No abnormal muscle tone.     Coordination: Coordination normal.     Deep Tendon Reflexes: Reflexes normal.  Psychiatric:        Behavior: Behavior normal.        Thought Content: Thought content normal.        Judgment: Judgment normal.     Lab Results  Component Value Date   WBC 5.7 06/15/2022   HGB 14.1 06/15/2022   HCT 42.7 06/15/2022   PLT 184.0 06/15/2022   GLUCOSE 124 (H)  06/15/2022   CHOL 193 06/15/2022   TRIG 220.0 (H) 06/15/2022   HDL 36.00 (L) 06/15/2022   LDLDIRECT 133.0 06/15/2022   LDLCALC 131 (H) 08/21/2020   ALT 18 06/15/2022   AST 18 06/15/2022   NA 138 06/15/2022   K 4.2 06/15/2022   CL 101 06/15/2022   CREATININE 0.62 06/15/2022   BUN 17 06/15/2022   CO2 30 06/15/2022   TSH 1.47 06/15/2022   INR 0.96 11/21/2015   HGBA1C 6.2 02/26/2021    MM 3D SCREEN BREAST BILATERAL  Result Date: 11/06/2021 CLINICAL DATA:  Screening. EXAM: DIGITAL SCREENING BILATERAL MAMMOGRAM WITH TOMOSYNTHESIS AND CAD TECHNIQUE: Bilateral screening digital craniocaudal and mediolateral oblique mammograms were obtained. Bilateral screening digital breast tomosynthesis was performed. The images were evaluated with computer-aided detection. COMPARISON:  Previous exam(s). ACR Breast Density Category c: The breast tissue is heterogeneously dense, which may obscure small masses. FINDINGS: There are no findings suspicious for malignancy. IMPRESSION: No mammographic evidence of malignancy. A result letter of this screening mammogram will be mailed directly to the patient. RECOMMENDATION: Screening mammogram in one year. (Code:SM-B-01Y) BI-RADS CATEGORY  1: Negative. Electronically Signed   By: Sherron Ales M.D.   On: 11/06/2021 12:50    Assessment & Plan:   Problem List Items Addressed This Visit     Dyslipidemia    2017 normal heart cath On diet      Arthralgia    Using Ibuprofen prn Chronic - UEs shoulders, elbows 4 - pain is up to 7-8/10 Using Ibuprofen prn      Hyperglycemia - Primary    Monitor labs Cont w/wt loss      Relevant Orders   Comprehensive metabolic panel   Hemoglobin A1c   Well adult exam     We discussed age appropriate health related issues, including available/recomended screening tests and vaccinations. Labs were ordered to be later reviewed . All questions were answered. We discussed one or more of the following - seat belt use, use of  sunscreen/sun exposure exercise, fall risk reduction, second hand smoke exposure, firearm use and storage, seat belt use, a need for adhering to healthy diet and exercise. Labs were ordered.  All questions were answered.         Meds ordered this encounter  Medications   triamcinolone cream (KENALOG) 0.1 %  Sig: Apply 1 Application topically 4 (four) times daily.    Dispense:  160 g    Refill:  1      Follow-up: Return in about 6 months (around 12/16/2022) for a follow-up visit.  Sonda Primes, MD

## 2022-07-07 ENCOUNTER — Other Ambulatory Visit: Payer: Self-pay | Admitting: Internal Medicine

## 2022-09-12 ENCOUNTER — Other Ambulatory Visit: Payer: Self-pay | Admitting: Internal Medicine

## 2022-09-13 ENCOUNTER — Other Ambulatory Visit: Payer: Self-pay | Admitting: Internal Medicine

## 2022-09-24 ENCOUNTER — Other Ambulatory Visit: Payer: Self-pay | Admitting: Internal Medicine

## 2022-09-24 DIAGNOSIS — Z1231 Encounter for screening mammogram for malignant neoplasm of breast: Secondary | ICD-10-CM

## 2022-11-08 ENCOUNTER — Other Ambulatory Visit: Payer: Self-pay | Admitting: Internal Medicine

## 2022-11-09 ENCOUNTER — Ambulatory Visit
Admission: RE | Admit: 2022-11-09 | Discharge: 2022-11-09 | Disposition: A | Discharge status: Home / Self Care | Payer: Commercial Managed Care - PPO | Source: Ambulatory Visit | Attending: Internal Medicine | Admitting: Internal Medicine

## 2022-11-09 DIAGNOSIS — Z1231 Encounter for screening mammogram for malignant neoplasm of breast: Secondary | ICD-10-CM

## 2022-12-21 ENCOUNTER — Other Ambulatory Visit (INDEPENDENT_AMBULATORY_CARE_PROVIDER_SITE_OTHER): Payer: Commercial Managed Care - PPO

## 2022-12-21 DIAGNOSIS — R739 Hyperglycemia, unspecified: Secondary | ICD-10-CM

## 2022-12-21 DIAGNOSIS — C50411 Malignant neoplasm of upper-outer quadrant of right female breast: Secondary | ICD-10-CM

## 2022-12-21 LAB — COMPREHENSIVE METABOLIC PANEL
ALT: 16 U/L (ref 0–35)
AST: 19 U/L (ref 0–37)
Albumin: 4.4 g/dL (ref 3.5–5.2)
Alkaline Phosphatase: 103 U/L (ref 39–117)
BUN: 24 mg/dL — ABNORMAL HIGH (ref 6–23)
CO2: 31 meq/L (ref 19–32)
Calcium: 9.3 mg/dL (ref 8.4–10.5)
Chloride: 102 meq/L (ref 96–112)
Creatinine, Ser: 0.61 mg/dL (ref 0.40–1.20)
GFR: 92.77 mL/min (ref >60.00)
Glucose, Bld: 78 mg/dL (ref 70–99)
Potassium: 4.4 meq/L (ref 3.5–5.1)
Sodium: 139 meq/L (ref 135–145)
Total Bilirubin: 0.3 mg/dL (ref 0.2–1.2)
Total Protein: 7 g/dL (ref 6.0–8.3)

## 2022-12-21 LAB — HEMOGLOBIN A1C: Hgb A1c MFr Bld: 6.1 % (ref 4.6–6.5)

## 2022-12-23 ENCOUNTER — Ambulatory Visit: Payer: Commercial Managed Care - PPO | Admitting: Internal Medicine

## 2023-01-11 ENCOUNTER — Other Ambulatory Visit: Payer: Self-pay | Admitting: Internal Medicine

## 2023-01-12 ENCOUNTER — Ambulatory Visit: Payer: Commercial Managed Care - PPO | Admitting: Internal Medicine

## 2023-01-21 ENCOUNTER — Ambulatory Visit: Payer: Commercial Managed Care - PPO | Admitting: Internal Medicine

## 2023-01-21 ENCOUNTER — Encounter: Payer: Self-pay | Admitting: Internal Medicine

## 2023-01-21 VITALS — BP 134/70 | HR 70 | Temp 97.6°F | Ht 62.0 in | Wt 164.1 lb

## 2023-01-21 DIAGNOSIS — E785 Hyperlipidemia, unspecified: Secondary | ICD-10-CM

## 2023-01-21 DIAGNOSIS — M2550 Pain in unspecified joint: Secondary | ICD-10-CM | POA: Diagnosis not present

## 2023-01-21 DIAGNOSIS — K219 Gastro-esophageal reflux disease without esophagitis: Secondary | ICD-10-CM | POA: Diagnosis not present

## 2023-01-21 MED ORDER — CEFDINIR 300 MG PO CAPS: 300.0000 mg | ORAL_CAPSULE | Freq: Two times a day (BID) | ORAL | 0 refills | Status: DC

## 2023-01-21 MED ORDER — TEMAZEPAM 7.5 MG PO CAPS: 7.5000 mg | ORAL_CAPSULE | Freq: Every evening | ORAL | 5 refills | Status: DC | PRN

## 2023-01-21 NOTE — Assessment & Plan Note (Signed)
Using Ibuprofen prn Chronic - UEs shoulders, elbows 4 - pain is up to 7-8/10 Using Ibuprofen prn

## 2023-01-21 NOTE — Progress Notes (Signed)
Subjective:  Patient ID: Marie Pierce, female    DOB: August 09, 1955  Age: 68 y.o. MRN: [id number]  CC: No chief complaint on file.   HPI Marie Pierce presents for sinusitis x weeks  F/u on insomnia, GERD  Outpatient Medications Prior to Visit  Medication Sig Dispense Refill   aspirin EC 81 MG tablet Take 81 mg by mouth daily.     CALCIUM PO Take by mouth.     esomeprazole (NEXIUM) 40 MG capsule TAKE 1 CAPSULE BY MOUTH EVERY DAY BEFORE BREAKFAST 90 capsule 1   IBUPROFEN PO Take by mouth as needed.     venlafaxine (EFFEXOR) 75 MG tablet TAKE 1 TABLET BY MOUTH EVERY DAY 30 tablet 5   VITAMIN D PO Take by mouth.     temazepam (RESTORIL) 7.5 MG capsule TAKE 1-2 CAPSULES (7.5-15 MG TOTAL) BY MOUTH AT BEDTIME AS NEEDED FOR SLEEP. 60 capsule 2   B Complex Vitamins (VITAMIN B COMPLEX) TABS See admin instructions. (Patient not taking: Reported on 01/21/2023)     Multiple Vitamin (MULTI-VITAMIN DAILY PO) Multi Vitamin (Patient not taking: Reported on 01/21/2023)     triamcinolone cream (KENALOG) 0.1 % Apply 1 Application topically 4 (four) times daily. (Patient not taking: Reported on 01/21/2023) 160 g 1   Zinc 10 MG LOZG zinc (Patient not taking: Reported on 01/21/2023)     No facility-administered medications prior to visit.    ROS: Review of Systems  Constitutional:  Negative for activity change, appetite change, chills, fatigue and unexpected weight change.  HENT:  Negative for congestion, mouth sores and sinus pressure.   Eyes:  Negative for visual disturbance.  Respiratory:  Negative for cough and chest tightness.   Gastrointestinal:  Negative for abdominal pain and nausea.  Genitourinary:  Negative for difficulty urinating, frequency and vaginal pain.  Musculoskeletal:  Negative for back pain and gait problem.  Skin:  Negative for pallor and rash.  Neurological:  Negative for dizziness, tremors, weakness, numbness and headaches.  Psychiatric/Behavioral:  Negative for  confusion and sleep disturbance.     Objective:  BP 134/70   Pulse 70   Temp 97.6 F (36.4 C) (Temporal)   Ht 5\' 2"  (1.575 m)   Wt 164 lb 2 oz (74.4 kg)   SpO2 96%   BMI 30.02 kg/m   BP Readings from Last 3 Encounters:  01/21/23 134/70  06/16/22 118/62  10/29/21 138/80    Wt Readings from Last 3 Encounters:  01/21/23 164 lb 2 oz (74.4 kg)  06/16/22 166 lb 12.8 oz (75.7 kg)  10/29/21 167 lb 3.2 oz (75.8 kg)    Physical Exam Constitutional:      General: She is not in acute distress.    Appearance: Normal appearance. She is well-developed.  HENT:     Head: Normocephalic.     Right Ear: External ear normal.     Left Ear: External ear normal.     Nose: Nose normal.  Eyes:     General:        Right eye: No discharge.        Left eye: No discharge.     Conjunctiva/sclera: Conjunctivae normal.     Pupils: Pupils are equal, round, and reactive to light.  Neck:     Thyroid: No thyromegaly.     Vascular: No JVD.     Trachea: No tracheal deviation.  Cardiovascular:     Rate and Rhythm: Normal rate and regular rhythm.     Heart  sounds: Normal heart sounds.  Pulmonary:     Effort: No respiratory distress.     Breath sounds: No stridor. No wheezing.  Abdominal:     General: Bowel sounds are normal. There is no distension.     Palpations: Abdomen is soft. There is no mass.     Tenderness: There is no abdominal tenderness. There is no guarding or rebound.  Musculoskeletal:        General: No tenderness.     Cervical back: Normal range of motion and neck supple. No rigidity.  Lymphadenopathy:     Cervical: No cervical adenopathy.  Skin:    Findings: No erythema or rash.  Neurological:     Cranial Nerves: No cranial nerve deficit.     Motor: No abnormal muscle tone.     Coordination: Coordination normal.     Gait: Gait normal.     Deep Tendon Reflexes: Reflexes normal.  Psychiatric:        Behavior: Behavior normal.        Thought Content: Thought content normal.         Judgment: Judgment normal.     Lab Results  Component Value Date   WBC 5.7 06/15/2022   HGB 14.1 06/15/2022   HCT 42.7 06/15/2022   PLT 184.0 06/15/2022   GLUCOSE 78 12/21/2022   CHOL 193 06/15/2022   TRIG 220.0 (H) 06/15/2022   HDL 36.00 (L) 06/15/2022   LDLDIRECT 133.0 06/15/2022   LDLCALC 131 (H) 08/21/2020   ALT 16 12/21/2022   AST 19 12/21/2022   NA 139 12/21/2022   K 4.4 12/21/2022   CL 102 12/21/2022   CREATININE 0.61 12/21/2022   BUN 24 (H) 12/21/2022   CO2 31 12/21/2022   TSH 1.47 06/15/2022   INR 0.96 11/21/2015   HGBA1C 6.1 12/21/2022    MM 3D SCREENING MAMMOGRAM BILATERAL BREAST Result Date: 11/11/2022 CLINICAL DATA:  Screening. EXAM: DIGITAL SCREENING BILATERAL MAMMOGRAM WITH TOMOSYNTHESIS AND CAD TECHNIQUE: Bilateral screening digital craniocaudal and mediolateral oblique mammograms were obtained. Bilateral screening digital breast tomosynthesis was performed. The images were evaluated with computer-aided detection. COMPARISON:  Previous exam(s). ACR Breast Density Category c: The breasts are heterogeneously dense, which may obscure small masses. FINDINGS: There are no findings suspicious for malignancy. IMPRESSION: No mammographic evidence of malignancy. A result letter of this screening mammogram will be mailed directly to the patient. RECOMMENDATION: Screening mammogram in one year. (Code:SM-B-01Y) BI-RADS CATEGORY  1: Negative. Electronically Signed   By: Sherian Rein M.D.   On: 11/11/2022 12:33    Assessment & Plan:   Problem List Items Addressed This Visit     GERD (gastroesophageal reflux disease) - Primary   Chronic - on Nexium      Dyslipidemia   2017 normal heart cath On diet      Arthralgia   Using Ibuprofen prn Chronic - UEs shoulders, elbows 4 - pain is up to 7-8/10 Using Ibuprofen prn         Meds ordered this encounter  Medications   temazepam (RESTORIL) 7.5 MG capsule    Sig: Take 1-2 capsules (7.5-15 mg total) by mouth at  bedtime as needed for sleep.    Dispense:  60 capsule    Refill:  5    This request is for a new prescription for a controlled substance as required by Federal/State law.   cefdinir (OMNICEF) 300 MG capsule    Sig: Take 1 capsule (300 mg total) by mouth 2 (two) times daily.  Dispense:  20 capsule    Refill:  0      Follow-up: Return in about 6 months (around 07/21/2023) for Wellness Exam.  Sonda Primes, MD

## 2023-01-21 NOTE — Assessment & Plan Note (Signed)
2017 normal heart cath On diet 

## 2023-01-21 NOTE — Assessment & Plan Note (Signed)
Chronic - on Nexium

## 2023-01-28 ENCOUNTER — Telehealth: Payer: Self-pay | Admitting: Internal Medicine

## 2023-01-28 NOTE — Telephone Encounter (Signed)
Copied from CRM [id number]. Topic: Clinical - Prescription Issue >> [name] 28, 2023 11:40 AM Shelbie Proctor wrote: Reason for CRM:  Patient [phone] states lost Cefdinir (OMNICEF) 300 MG capsule Sig: Take 1 capsule (300 mg total) by mouth 2 (two) times daily and found the medication in the car this morning. Patient consult with pharmacy and they recommend patient to contact the provider for a new rx. Please advise and call back.  [id number] [place] [address] [place] [address] Phone:[phone]

## 2023-02-01 ENCOUNTER — Telehealth: Payer: Self-pay

## 2023-02-01 NOTE — Telephone Encounter (Unsigned)
Copied from CRM [id number]. Topic: Clinical - Prescription Issue >> Feb 01, 2023  1:56 PM Elizebeth Brooking wrote: Reason for CRM: Patient called in regarding cefdinir (OMNICEF) 300 MG capsule , wanted an updated status about the prescription to see if it was resent to the pharmacy or not, is requesting a callback with that status update at [phone]

## 2023-02-01 NOTE — Telephone Encounter (Signed)
I do not understand what happened and what the question is.  Thank you

## 2023-02-02 ENCOUNTER — Other Ambulatory Visit: Payer: Self-pay

## 2023-02-02 MED ORDER — CEFDINIR 300 MG PO CAPS: 300.0000 mg | ORAL_CAPSULE | Freq: Two times a day (BID) | ORAL | 0 refills | Status: AC

## 2023-02-02 NOTE — Telephone Encounter (Signed)
Medication has been re-sent to pts pharmacy.

## 2023-05-18 ENCOUNTER — Other Ambulatory Visit: Payer: Self-pay | Admitting: Internal Medicine

## 2023-05-24 ENCOUNTER — Telehealth: Payer: Self-pay

## 2023-05-24 NOTE — Telephone Encounter (Signed)
 Copied from CRM [id number]. Topic: Clinical - Medication Question >> May 21, 2023  5:03 PM Marie Pierce wrote: Reason for CRM: patient called and stated that she wants to start using [facility] for all her medications, tele: [phone].

## 2023-05-28 NOTE — Telephone Encounter (Signed)
 Copied from CRM [id number]. Topic: General - Other >> May 28, 2023  2:57 PM Jenice Mitts wrote: Reason for CRM: Patient is calling to see if [facility] so her [facility] insurance can verify

## 2023-06-04 ENCOUNTER — Other Ambulatory Visit: Payer: Self-pay

## 2023-06-06 ENCOUNTER — Other Ambulatory Visit: Payer: Self-pay | Admitting: Internal Medicine

## 2023-06-11 ENCOUNTER — Other Ambulatory Visit: Payer: Self-pay | Admitting: Internal Medicine

## 2023-06-22 ENCOUNTER — Telehealth: Payer: Self-pay | Admitting: Internal Medicine

## 2023-06-22 NOTE — Telephone Encounter (Signed)
 Copied from CRM [id number]. Topic: Clinical - Prescription Issue >> Jun 22, 2023  2:51 PM Ivette P wrote: Reason for CRM: PT calling in because [facility]    [facility] and is req[facility]usting all medicaiton go to [facility] due to insurance.   CRM was sent on 05/16 but medication is still going to the incorrect pharmacy.    Please send medication refill request to  [facility].  [phone]  [phone] - [email or web address] [place]  [address]   Pt was advised would not be able to [facility].

## 2023-06-24 MED ORDER — ESOMEPRAZOLE MAGNESIUM 40 MG PO CPDR: DELAYED_RELEASE_CAPSULE | ORAL | 3 refills | Status: AC

## 2023-06-24 NOTE — Telephone Encounter (Signed)
 OK. Thx

## 2023-07-02 ENCOUNTER — Telehealth: Payer: Self-pay | Admitting: Internal Medicine

## 2023-07-02 ENCOUNTER — Telehealth: Payer: Self-pay

## 2023-07-02 ENCOUNTER — Other Ambulatory Visit (HOSPITAL_COMMUNITY): Payer: Self-pay

## 2023-07-02 NOTE — Telephone Encounter (Signed)
 Pharmacy Patient Advocate Encounter   Received notification from Pt Calls Messages that prior authorization for VENLAFAXINE  75MG  is required/requested.   Insurance verification completed.   The patient is insured through [employer] .   Per test claim: Refill too soon. PA is not needed at this time. Medication was filled 06/13/23. Next eligible fill date is 07/05/23.

## 2023-07-02 NOTE — Telephone Encounter (Signed)
 Copied from CRM [id number]. Topic: Clinical - Medication Prior Auth >> Jul 02, 2023 11:07 AM Martinique E wrote: Reason for CRM: Patient called in stating that two of her medications, venlafaxine  (EFFEXOR ) 75 MG tablet and temazepam  (RESTORIL ) 7.5 MG capsule will need prior [facility] for [facility].

## 2023-07-02 NOTE — Telephone Encounter (Signed)
 PA request for Temazepam  has been Started. New Encounter has been or will be created for follow up. For additional info see Pharmacy Prior Auth telephone encounter from 07/02/23.   Per test claim: PA is not needed at this time for Venlaxafine. Medication was filled 06/13/23. Next eligible fill date is 07/05/23.

## 2023-07-02 NOTE — Telephone Encounter (Signed)
 Pharmacy Patient Advocate Encounter   Received notification from Pt Calls Messages that prior authorization for Temazepam  7.5MG  capsules is required/requested.   Insurance verification completed.   The patient is insured through [employer] .   Per test claim: PA required; PA started via CoverMyMeds. KEY [id number] . Please see clinical question(s) below that I am not finding the answer to in their chart and advise.

## 2023-07-05 NOTE — Telephone Encounter (Signed)
 Clinical questions have been answered and PA submitted. PA currently Pending.

## 2023-07-06 ENCOUNTER — Other Ambulatory Visit (HOSPITAL_COMMUNITY): Payer: Self-pay

## 2023-07-06 NOTE — Telephone Encounter (Signed)
 Pharmacy Patient Advocate Encounter  Received notification from [facility] that Prior Authorization for Temazepam  7.5MG  capsules has been APPROVED from 05/18/23 to 08/04/23. Ran test claim, Copay is $10. This test claim was processed through [facility]- copay amounts may vary at other pharmacies due to pharmacy/plan contracts, or as the patient moves through the different stages of their insurance plan.   PA #/Case ID/Reference #: [id number]    Per test claim, MAXIMUM QTY SUPPLY OF #30 ALLOWED

## 2023-07-12 ENCOUNTER — Telehealth: Payer: Self-pay

## 2023-07-12 NOTE — Telephone Encounter (Signed)
 Copied from CRM [id number]. Topic: Clinical - Medication Prior Auth >> Jul 12, 2023 10:58 AM Marie Pierce wrote: Reason for CRM: Patient called in regarding prescription Temazepam  7.5MG  capsules, informed her per note that pa was approved, she stated that pharmacy has still not received it and she is almost out of it

## 2023-07-14 MED ORDER — TEMAZEPAM 7.5 MG PO CAPS: 7.5000 mg | ORAL_CAPSULE | Freq: Every evening | ORAL | 0 refills | Status: DC | PRN

## 2023-09-15 ENCOUNTER — Other Ambulatory Visit: Payer: Self-pay | Admitting: Internal Medicine

## 2023-09-15 NOTE — Telephone Encounter (Unsigned)
 Copied from CRM [id number]. Topic: Clinical - Medication Refill >> Sep 15, 2023 10:27 AM Viola F wrote: Medication: venlafaxine  (EFFEXOR ) 75 MG tablet [[id number]]  Has the patient contacted their pharmacy? Yes (Agent: If no, request that the patient contact the pharmacy for the refill. If patient does not wish to contact the pharmacy document the reason why and proceed with request.) (Agent: If yes, when and what did the pharmacy advise?)  This is the patient's preferred pharmacy:   [facility] - [place] - [address] [place] [address] Phone: [phone] Fax: [phone]   Is this the correct pharmacy for this prescription? Yes If no, delete pharmacy and type the correct one.   Has the prescription been filled recently? Yes  Is the patient out of the medication? Yes  Has the patient been seen for an appointment in the last year OR does the patient have an upcoming appointment? Yes  Can we respond through MyChart? Yes  Agent: Please be advised that Rx refills may take up to 3 business days. We ask that you follow-up with your pharmacy.

## 2023-09-15 NOTE — Telephone Encounter (Signed)
 FYI Only or Action Required?: Action required by provider: medication refill request.  Patient was last seen in primary care on 01/21/2023 by Plotnikov, Karlynn GAILS, MD.  Called Nurse Triage reporting No chief complaint on file..  Symptoms began today.  Interventions attempted: Nothing.  Symptoms are: stable.  Triage Disposition: No disposition on file.  Patient/caregiver understands and will follow disposition?:

## 2023-09-26 ENCOUNTER — Other Ambulatory Visit: Payer: Self-pay | Admitting: Internal Medicine

## 2023-11-10 ENCOUNTER — Other Ambulatory Visit: Payer: Self-pay | Admitting: Internal Medicine

## 2023-11-10 NOTE — Telephone Encounter (Signed)
 Copied from CRM [id number]. Topic: Clinical - Medication Refill >> Nov 10, 2023 10:20 AM Shereese L wrote: Medication: temazepam  (RESTORIL ) 7.5 MG capsule  Has the patient contacted their pharmacy? Yes (Agent: If no, request that the patient contact the pharmacy for the refill. If patient does not wish to contact the pharmacy document the reason why and proceed with request.) (Agent: If yes, when and what did the pharmacy advise?)  This is the patient's preferred pharmacy:  [facility] - [place], [place] - [address] [place] [address] Phone: [phone] Fax: [phone]   Is this the correct pharmacy for this prescription? Yes If no, delete pharmacy and type the correct one.   Has the prescription been filled recently? Yes  Is the patient out of the medication? Yes  Has the patient been seen for an appointment in the last year OR does the patient have an upcoming appointment? Yes  Can we respond through MyChart? Yes  Agent: Please be advised that Rx refills may take up to 3 business days. We ask that you follow-up with your pharmacy.

## 2023-11-15 ENCOUNTER — Other Ambulatory Visit: Payer: Self-pay | Admitting: Internal Medicine

## 2023-11-23 ENCOUNTER — Other Ambulatory Visit: Payer: Self-pay | Admitting: Internal Medicine

## 2023-11-23 DIAGNOSIS — Z1231 Encounter for screening mammogram for malignant neoplasm of breast: Secondary | ICD-10-CM

## 2023-12-21 ENCOUNTER — Ambulatory Visit
Admission: RE | Admit: 2023-12-21 | Discharge: 2023-12-21 | Disposition: A | Payer: Self-pay | Source: Ambulatory Visit | Attending: Internal Medicine | Admitting: Internal Medicine

## 2023-12-21 DIAGNOSIS — Z1231 Encounter for screening mammogram for malignant neoplasm of breast: Secondary | ICD-10-CM

## 2024-01-17 ENCOUNTER — Other Ambulatory Visit: Payer: Self-pay | Admitting: Internal Medicine

## 2024-01-17 ENCOUNTER — Telehealth: Payer: Self-pay

## 2024-01-17 NOTE — Telephone Encounter (Unsigned)
 Copied from CRM #[id number]. Topic: Clinical - Medication Refill >> [name] 17, 2024 12:45 PM Suzen RAMAN wrote: Medication: venlafaxine  (EFFEXOR ) 75 MG tablet **90 day request per patient medication is cheaper**  Has the patient contacted their pharmacy? Yes   This is the patient's preferred pharmacy:  [facility] - [place], [place] - [address] [place] [address] Phone: [phone] Fax: [phone]  Is this the correct pharmacy for this prescription? Yes If no, delete pharmacy and type the correct one.   Has the prescription been filled recently? No  Is the patient out of the medication? No  Has the patient been seen for an appointment in the last year OR does the patient have an upcoming appointment? Yes  Can we respond through MyChart? No  Agent: Please be advised that Rx refills may take up to 3 business days. We ask that you follow-up with your pharmacy.

## 2024-01-17 NOTE — Telephone Encounter (Signed)
 Copied from CRM [id number]. Topic: Clinical - Medication Question >> [name] 17, 2024 12:54 PM Suzen RAMAN wrote: Reason for CRM: Patient requested a medication refill for venlafaxine  (EFFEXOR ) 75 MG tablet be sent to [facility]; per patient the processing time is 14 days. Patient currently has 7 pills left and would like to know if a short supply can be sent to her local pharmacy([id number] [place], [place] - [address]) while waiting on mail order.

## 2024-01-19 MED ORDER — VENLAFAXINE HCL 75 MG PO TABS: 75.0000 mg | ORAL_TABLET | Freq: Every day | ORAL | 0 refills | Status: AC

## 2024-01-24 NOTE — Telephone Encounter (Signed)
 It has been done on 01/19/2024.  Schedule follow-up office visit.  Thanks
